# Patient Record
Sex: Female | Born: 1975 | Race: Black or African American | Hispanic: No | Marital: Single | Smoking: Former smoker
Health system: Southern US, Community
[De-identification: ages and names within clinical notes are randomized; demographics above are authoritative.]

## PROBLEM LIST (undated history)

## (undated) ENCOUNTER — Ambulatory Visit: Discharge status: Absent without leave | Payer: 59

## (undated) DIAGNOSIS — R569 Unspecified convulsions: Secondary | ICD-10-CM

## (undated) DIAGNOSIS — J439 Emphysema, unspecified: Secondary | ICD-10-CM

## (undated) DIAGNOSIS — R030 Elevated blood-pressure reading, without diagnosis of hypertension: Secondary | ICD-10-CM

## (undated) DIAGNOSIS — E079 Disorder of thyroid, unspecified: Secondary | ICD-10-CM

## (undated) DIAGNOSIS — E789 Disorder of lipoprotein metabolism, unspecified: Secondary | ICD-10-CM

## (undated) DIAGNOSIS — K8020 Calculus of gallbladder without cholecystitis without obstruction: Secondary | ICD-10-CM

## (undated) DIAGNOSIS — F32A Depression, unspecified: Secondary | ICD-10-CM

## (undated) DIAGNOSIS — E785 Hyperlipidemia, unspecified: Secondary | ICD-10-CM

## (undated) DIAGNOSIS — J449 Chronic obstructive pulmonary disease, unspecified: Secondary | ICD-10-CM

## (undated) DIAGNOSIS — F419 Anxiety disorder, unspecified: Secondary | ICD-10-CM

## (undated) DIAGNOSIS — K219 Gastro-esophageal reflux disease without esophagitis: Secondary | ICD-10-CM

## (undated) DIAGNOSIS — L732 Hidradenitis suppurativa: Secondary | ICD-10-CM

## (undated) DIAGNOSIS — I10 Essential (primary) hypertension: Secondary | ICD-10-CM

## (undated) DIAGNOSIS — E0500 Thyrotoxicosis with diffuse goiter without thyrotoxic crisis or storm: Secondary | ICD-10-CM

## (undated) HISTORY — PX: TUBAL LIGATION: SHX77

## (undated) HISTORY — DX: Emphysema, unspecified: J43.9

## (undated) HISTORY — DX: Unspecified convulsions: R56.9

## (undated) HISTORY — DX: Gastro-esophageal reflux disease without esophagitis: K21.9

## (undated) HISTORY — DX: Essential (primary) hypertension: I10

## (undated) HISTORY — PX: HERNIA REPAIR: SHX51

## (undated) HISTORY — DX: Hyperlipidemia, unspecified: E78.5

## (undated) HISTORY — PX: GALLBLADDER SURGERY: SHX652

## (undated) HISTORY — DX: Disorder of thyroid, unspecified: E07.9

---

## 2000-05-16 ENCOUNTER — Inpatient Hospital Stay (HOSPITAL_COMMUNITY): Admission: AD | Admit: 2000-05-16 | Discharge: 2000-05-21 | Payer: Self-pay | Admitting: Obstetrics and Gynecology

## 2000-11-25 ENCOUNTER — Other Ambulatory Visit: Admission: RE | Admit: 2000-11-25 | Discharge: 2000-11-25 | Payer: Self-pay | Admitting: Obstetrics and Gynecology

## 2002-07-15 ENCOUNTER — Emergency Department (HOSPITAL_COMMUNITY): Admission: EM | Admit: 2002-07-15 | Discharge: 2002-07-15 | Payer: Self-pay | Admitting: *Deleted

## 2002-07-15 ENCOUNTER — Encounter: Payer: Self-pay | Admitting: *Deleted

## 2002-12-18 ENCOUNTER — Emergency Department (HOSPITAL_COMMUNITY): Admission: EM | Admit: 2002-12-18 | Discharge: 2002-12-18 | Payer: Self-pay | Admitting: Emergency Medicine

## 2003-01-23 ENCOUNTER — Emergency Department (HOSPITAL_COMMUNITY): Admission: EM | Admit: 2003-01-23 | Discharge: 2003-01-23 | Payer: Self-pay | Admitting: Emergency Medicine

## 2003-02-01 ENCOUNTER — Emergency Department (HOSPITAL_COMMUNITY): Admission: EM | Admit: 2003-02-01 | Discharge: 2003-02-01 | Payer: Self-pay | Admitting: Emergency Medicine

## 2003-07-09 ENCOUNTER — Emergency Department (HOSPITAL_COMMUNITY): Admission: EM | Admit: 2003-07-09 | Discharge: 2003-07-09 | Payer: Self-pay | Admitting: Emergency Medicine

## 2003-09-28 ENCOUNTER — Ambulatory Visit (HOSPITAL_COMMUNITY): Admission: RE | Admit: 2003-09-28 | Discharge: 2003-09-28 | Payer: Self-pay | Admitting: *Deleted

## 2004-06-01 ENCOUNTER — Emergency Department (HOSPITAL_COMMUNITY): Admission: EM | Admit: 2004-06-01 | Discharge: 2004-06-01 | Payer: Self-pay | Admitting: *Deleted

## 2005-09-17 ENCOUNTER — Emergency Department (HOSPITAL_COMMUNITY): Admission: EM | Admit: 2005-09-17 | Discharge: 2005-09-17 | Payer: Self-pay | Admitting: Emergency Medicine

## 2005-10-05 ENCOUNTER — Emergency Department (HOSPITAL_COMMUNITY): Admission: EM | Admit: 2005-10-05 | Discharge: 2005-10-06 | Payer: Self-pay | Admitting: Emergency Medicine

## 2008-09-19 ENCOUNTER — Emergency Department (HOSPITAL_COMMUNITY): Admission: EM | Admit: 2008-09-19 | Discharge: 2008-09-20 | Payer: Self-pay | Admitting: Emergency Medicine

## 2008-09-19 IMAGING — CR DG CHEST 2V
2 series · 2 of 2 positions shown · non-contrast
Comparison: None

CLINICAL DATA: Shortness of breath and cough; difficulty
swallowing.  History of smoking.

CHEST - 2 VIEW

[view not recorded (1 of 2)]
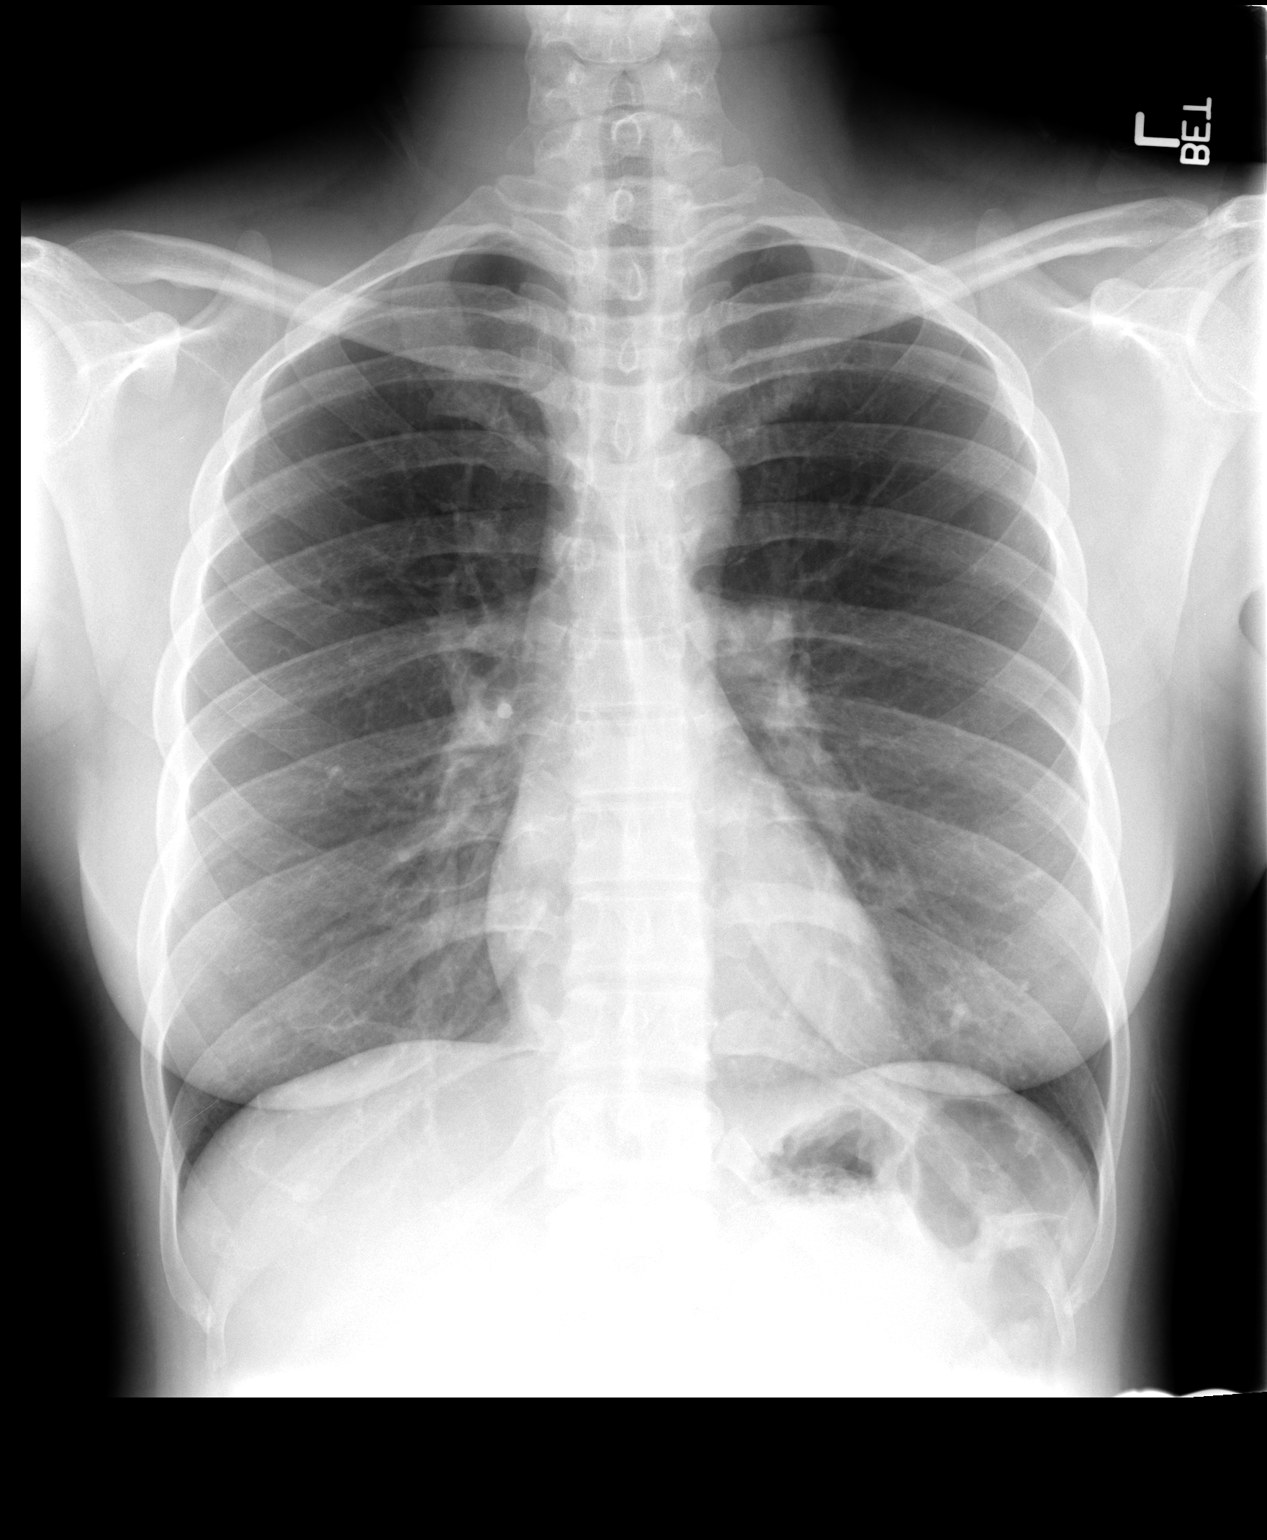

[view not recorded (2 of 2)]
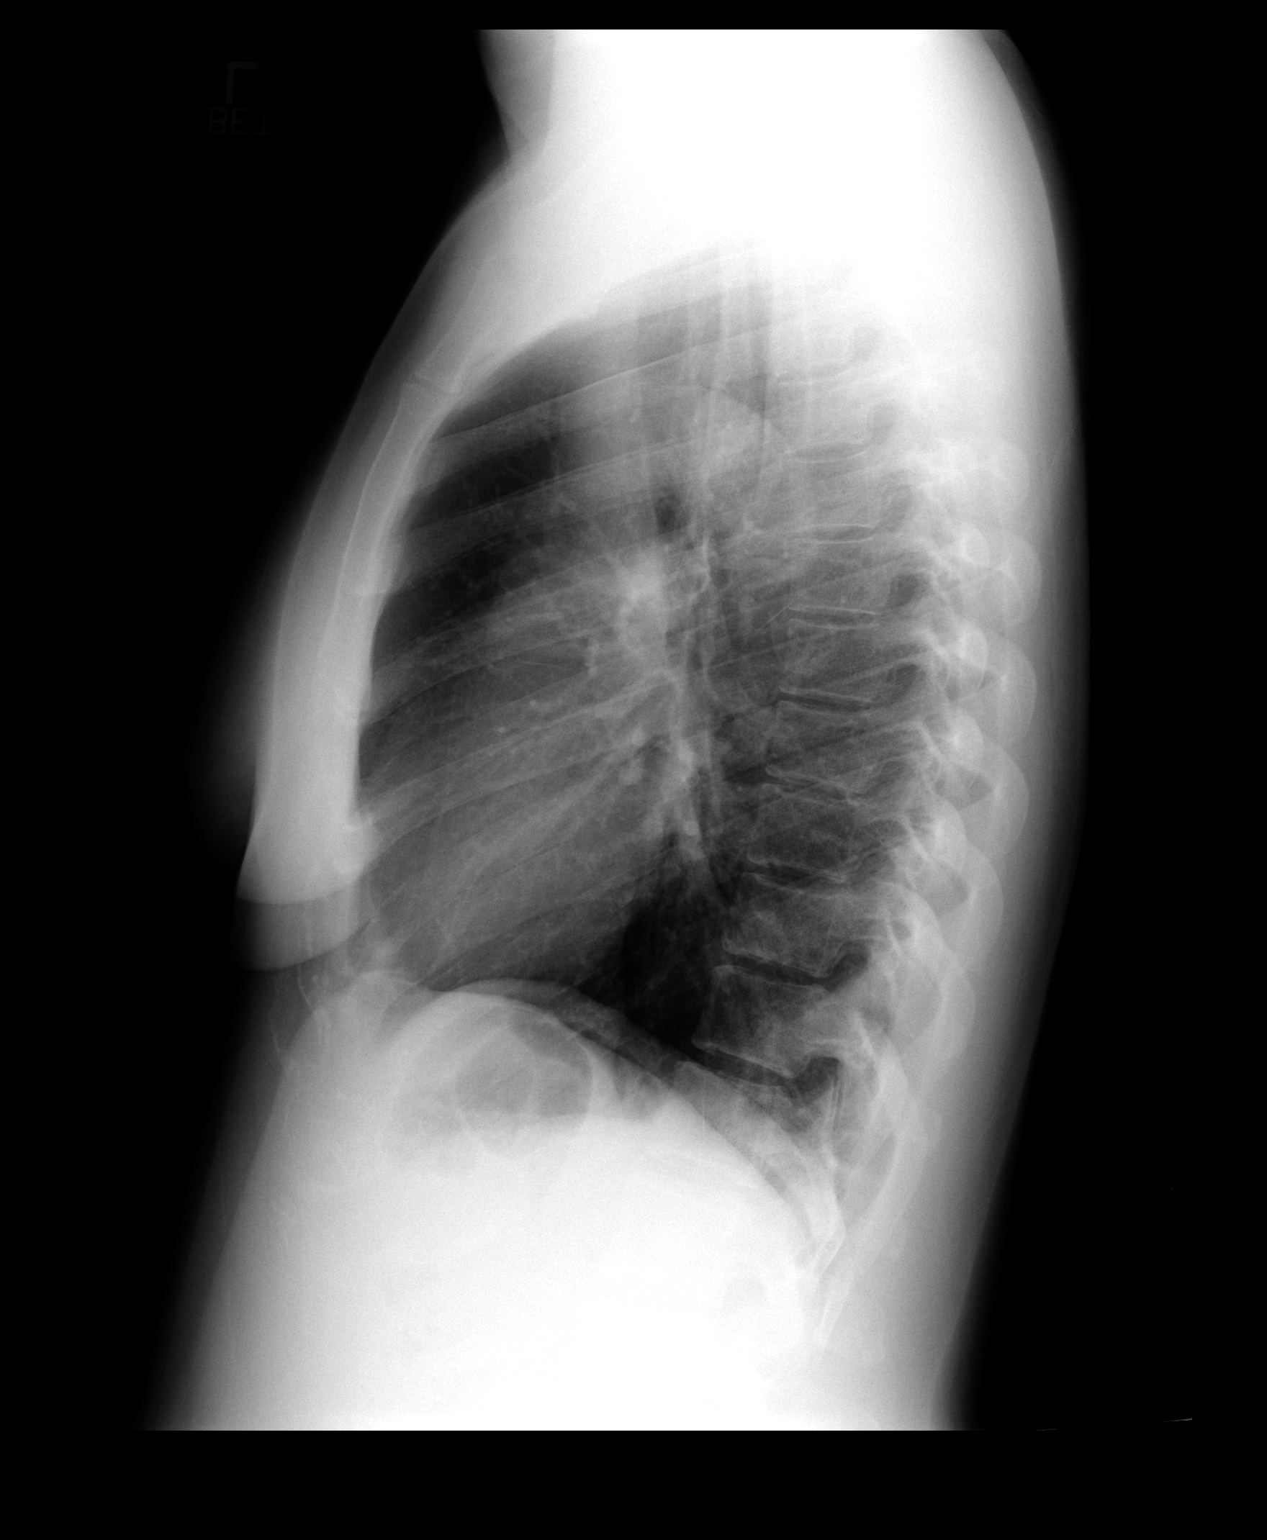

[2 of 2 positions shown; findings below may reference images not displayed]

FINDINGS: The lungs are well-aerated and clear.  There is no
evidence of focal opacification, pleural effusion or pneumothorax.

The heart is normal in size; the mediastinal contour is within
normal limits.  No acute osseous abnormalities are seen.
IMPRESSION: No acute cardiopulmonary process seen.

## 2010-06-13 NOTE — H&P (Signed)
NAME:  Kimberly Rocha, Kimberly Rocha                          ACCOUNT NO.:  [id number]   MEDICAL RECORD NO.:  [id number]                   PATIENT TYPE:  AMB   LOCATION:  DAY                                  FACILITY:  [facility]   PHYSICIAN:  Langley Gauss, M.D.                DATE OF BIRTH:  April 19, 1975   DATE OF ADMISSION:  09/28/2003  DATE OF DISCHARGE:  09/28/2003                                HISTORY & PHYSICAL   HISTORY OF PRESENT ILLNESS:  The patient is a 35 year old multiparous  patient currently utilizing Depo-Provera for birth-control purposes, who  desires permanent sterilization.  She understands and accepts the inherent  2% failure rate associated with the procedure, and understands that this is  to be considered a permanent and irreversible procedure.  She is also aware  that there are other reversible forms of birth control available.  At this  point in time, she is absolutely certain of her desire for no future  childbearing potential.  Thus, she is admitted for laparoscopic tubal  ligation utilizing bipolar cautery.   The risks and benefits of surgery were discussed with the patient prior to  performing it, to include risks of hemorrhage, infection, or complications  associated with the anesthesia.   ALLERGIES:  The patient states she is allergic to Demerol which causes her  to itch.   MEDICATIONS:  The most recent medication, Depo-Provera injected August 07, 2003.   PAST SURGICAL HISTORY:  1.  The patient had hernia repair in 1986 and again in 1987.  2.  The patient is noted to have four prior vaginal deliveries.  3.  On April 30, 2003, the patient presented to OB unassigned.  Twin      gestation of 33-5/[redacted] weeks gestation, at which time she underwent low-      transverse cesarean section by Dr. Langley Gauss.   PHYSICAL EXAMINATION:  GENERAL:  A pleasant, black female.  VITAL SIGNS:  Height 5 feet, 2 inches, weight 155, blood pressure 113/80,  pulse rate 80, respiratory rate  20.  HEENT:  Negative, no adenopathy.  NECK:  Supple.  Thyroid is nonpalpable.  LUNGS:  Clear.  CARDIOVASCULAR:  Exam is regular rate and rhythm.  ABDOMEN:  Soft, nontender.  She has previous Pfannenstiel incision noted.  No other surgical scars identified.  No pelvic masses.  EXTREMITIES:  Normal.  PELVIC EXAM:  Normal external genitalia, no lesions or ulcerations  identified.  Bladder as well as urethra noted to be well supported.  The  cervix is normal in appearance.  Bimanual examination, retroflexed normal-  sized uterus, no adnexal masses.   ASSESSMENT:  Multiparity, desires permanent sterilization.  The appropriate  paperwork had previously been completed at the office.  Thus, plan on  proceeding with the planned operative procedure on September 28, 2003.     ___________________________________________  Langley Gauss, M.D.   DC/MEDQ  D:  10/01/2003  T:  10/01/2003  Job:  [id number]

## 2010-06-13 NOTE — Op Note (Signed)
NAME:  Kimberly Rocha, Kimberly Rocha                          ACCOUNT NO.:  [id number]   MEDICAL RECORD NO.:  [id number]                   PATIENT TYPE:  AMB   LOCATION:  DAY                                  FACILITY:  [facility]   PHYSICIAN:  Langley Gauss, M.D.                DATE OF BIRTH:  Mar 07, 1975   DATE OF PROCEDURE:  09/28/2003  DATE OF DISCHARGE:  09/28/2003                                 OPERATIVE REPORT   PREOPERATIVE DIAGNOSIS:  Multiparity, desirous permanent sterilization.   POSTOPERATIVE DIAGNOSIS:  Multiparity, desirous permanent sterilization.   PROCEDURE PERFORMED:  Laparoscopic tubal ligation utilizing bipolar cautery.   SURGEON:  Langley Gauss, M.D.   ANESTHESIA:  General endotracheal.   SPECIMENS:  None.   COMPLICATIONS:  None.   FINDINGS:  At time of surgery include diffusely enlarged multiparous uterus  well supported, uterine fundus specifically noted to be somewhat globular,  minimal adhesive disease within the right adnexa, ovaries normal in  appearance.   SUMMARY:  Vital signs stable. Patient taken to the operating room. Underwent  induction of general anesthesia. Then placed in the dorsal lithotomy  position. Prepped and draped in usual sterile manner. Red Rover catheter  used to drain 250 mg cc of clear yellow urine from the bladder. Speculum was  placed. Anterior lip of the cervix was grasped with a single toothed  tenaculum. A Hulka intrauterine manipulator was then passed through the  endocervix, used to grasp cervix to 12 o'clock and then changed to sterile  gloves. Standing at the patient's left side, 1-cm vertical incision made  just inferior to the umbilicus. Abdominal wall was elevated. The Verres  needle was then passed through the subumbilical incision, angling  perpendicular to the fascial plane. This was done atraumatically. Drop test  then confirms a proper intraperitoneal placement with no apparent bowel or  vascular injury. Pneumoperitoneum was  then created by insufflating 3 liters  of CO2 gas with a filling pressure of 10 to 15 mmHg. Adequate  pneumoperitoneum achieved. Verres needle was removed. Abdominal wall was  again elevated. The disposal Visiport trocar and camera device were then  inserted subumbilically, angulating toward the hollow of the sacrum under  direct visualization. This was done atraumatically. Pelvic contents as  described previously. Uterus was then elevated. A 5-mm trocar inserted  suprapubically in the midline and also under direct visualization, pelvic  contents now well visualized. Each of the fallopian tubes were identified  and confirmed by tracing them to their fimbriated ends. Bipolar  cauterization was then performed on each tube with the most proximal being 1  cm from the tubouterine junction. A total of three cauterizations are  performed on each fallopian tube, each being in direct continuity with the  previous, resulting in extensive tubular destruction. Bipolar cautery is  continued until ____________ down at about 2 or 3. Appropriate photographs  were taken bilaterally to document this tubular destruction  bilaterally. In  addition, anterior and posterior cul-de-sac noted to be free of any  significant pathology and likewise photographed. Following the procedure and  determination of no intraperitoneal complications, instruments are removed  as pneumoperitoneum is allowed to escape. Our 2 incision sites are then  closed utilizing a 2-0 Vicryl suture through the fascial layer. This  likewise resulted in spontaneous reapproximation of the skin edges. The  patient tolerated the procedure very well. Speculum as well as Hulka  tenaculum taken off the cervix. The patient had reverse of anesthesia, taken  to recovery room in stable condition.   DISCHARGE:  The patient is discharged to home on same date of cervix, given  a copy of standard discharge instructions, follow up in the office in 1 to 2   weeks' time.   DISCHARGE MEDICATIONS:  She has previously been given a prescription for  Lortab 10/500 which she can now use in the postoperative state.   PERTINENT LABORATORY DATA:  She is noted to be HCG negative. Hemoglobin  13.1, hematocrit 37.4 with a white count of 9.0.   DISPOSITION:  Having no postoperative complications and satisfying all  criteria, the patient was discharged to home on September 28, 2003.      ___________________________________________                                            Langley Gauss, M.D.   DC/MEDQ  D:  10/01/2003  T:  10/01/2003  Job:  [id number]

## 2010-10-13 ENCOUNTER — Emergency Department (HOSPITAL_COMMUNITY): Payer: Self-pay

## 2010-10-13 ENCOUNTER — Encounter: Payer: Self-pay | Admitting: *Deleted

## 2010-10-13 ENCOUNTER — Emergency Department (HOSPITAL_COMMUNITY)
Admission: EM | Admit: 2010-10-13 | Discharge: 2010-10-13 | Disposition: A | Discharge status: Home / Self Care | Payer: Self-pay | Attending: Emergency Medicine | Admitting: Emergency Medicine

## 2010-10-13 DIAGNOSIS — R0789 Other chest pain: Secondary | ICD-10-CM | POA: Insufficient documentation

## 2010-10-13 DIAGNOSIS — M25519 Pain in unspecified shoulder: Secondary | ICD-10-CM | POA: Insufficient documentation

## 2010-10-13 IMAGING — CR DG CHEST 2V
2 series · 2 of 2 positions shown · non-contrast
Comparison: [DATE]

CLINICAL DATA: Chest pain.

CHEST - 2 VIEW

[view not recorded (1 of 2)]
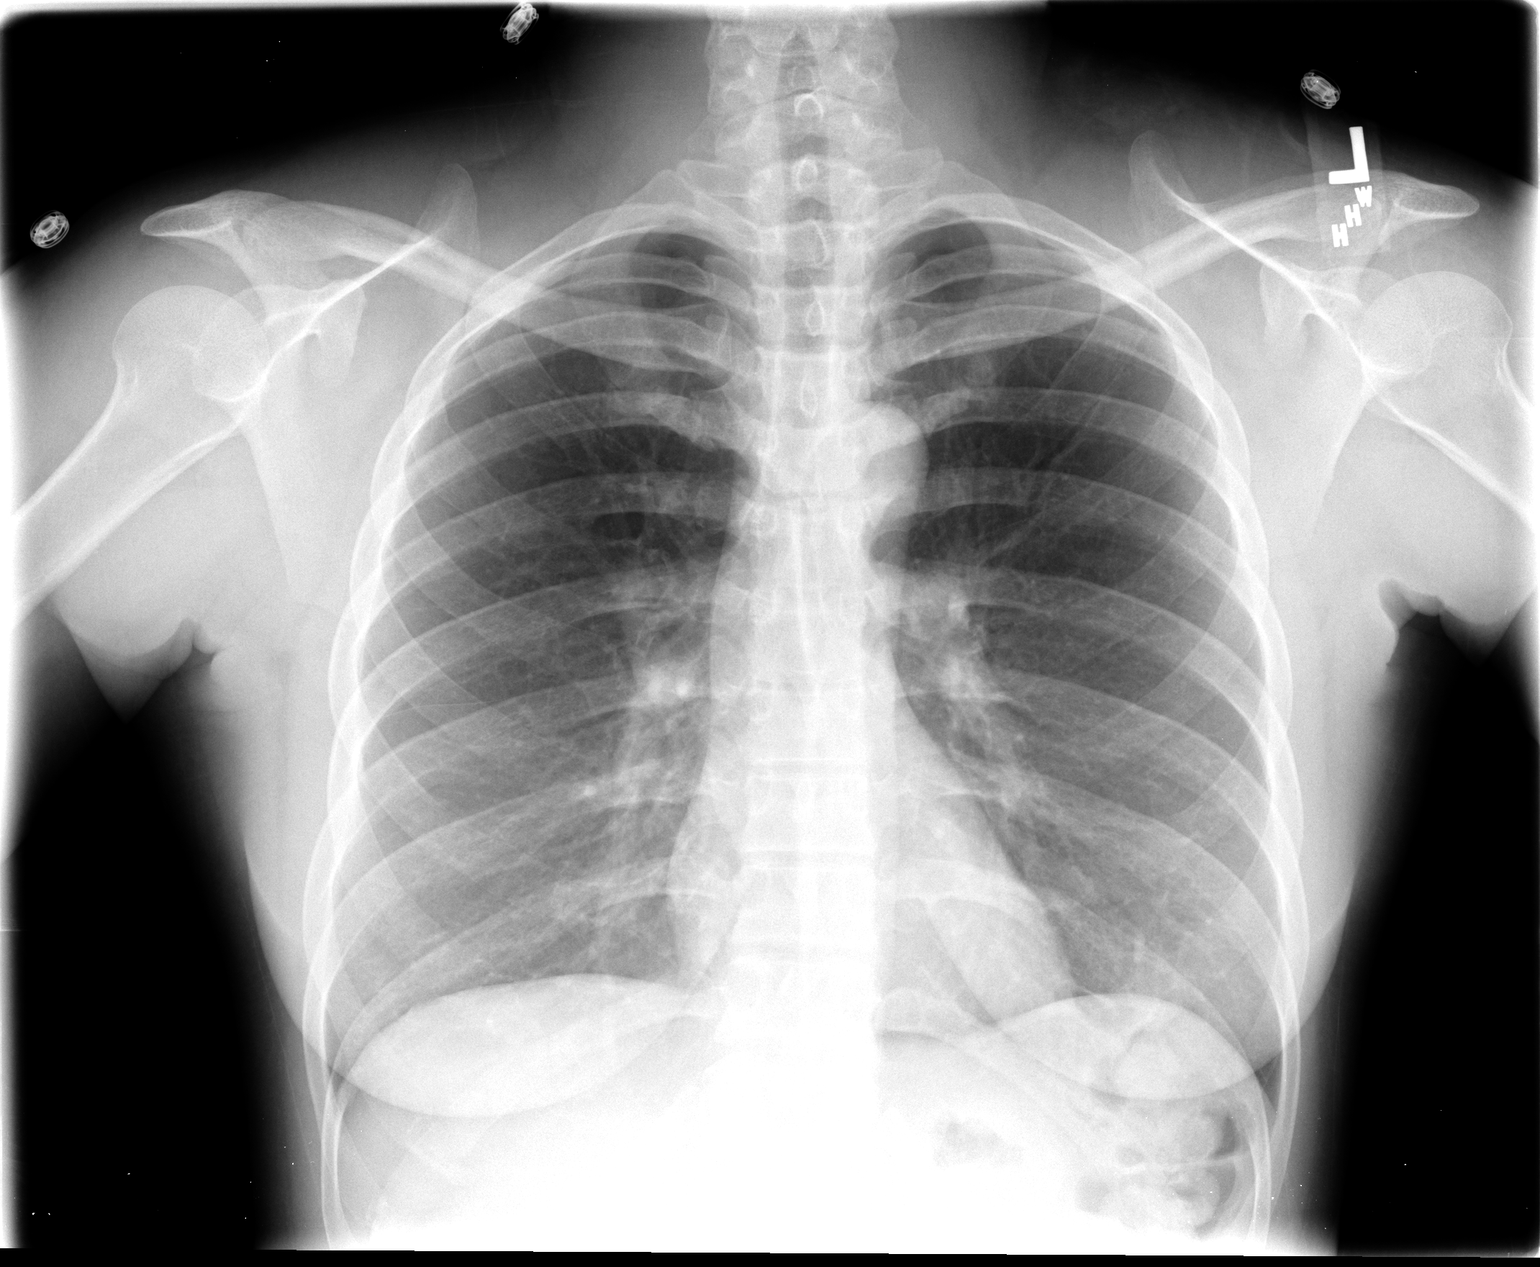

[view not recorded (2 of 2)]
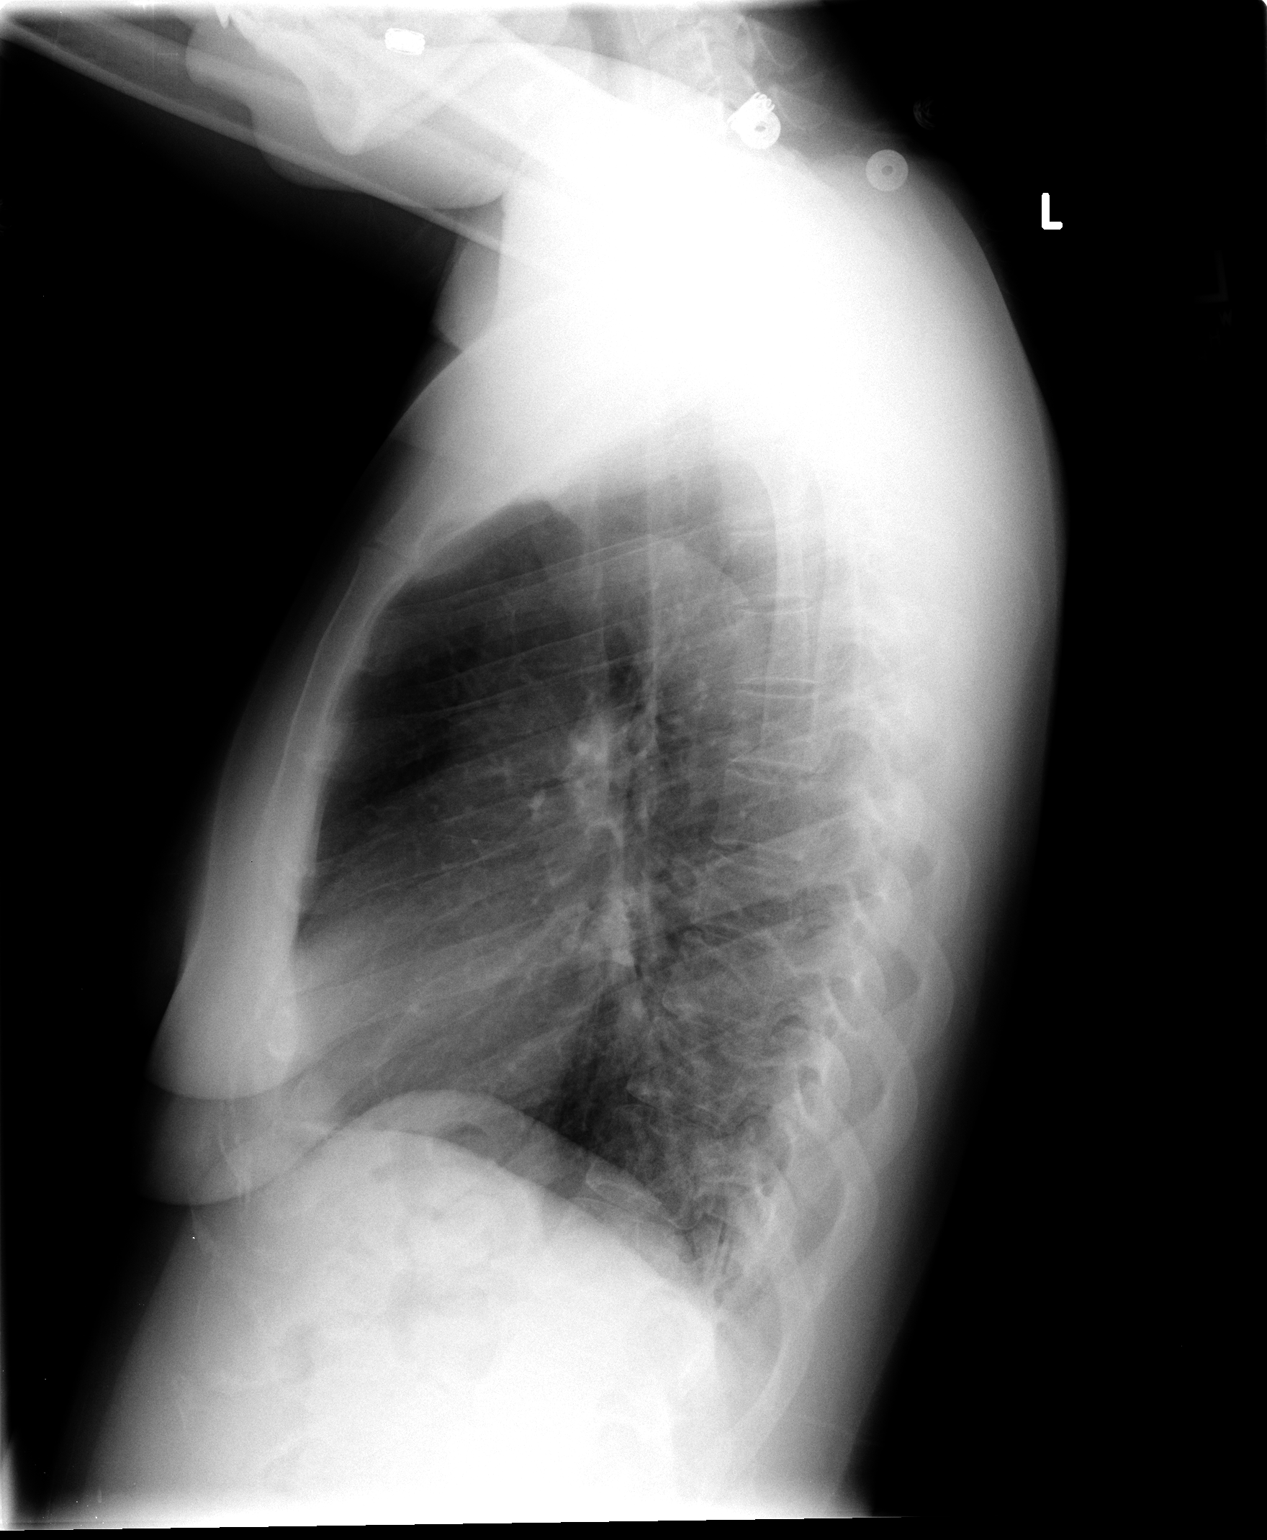

[2 of 2 positions shown; findings below may reference images not displayed]

FINDINGS: Lungs are clear. No pleural effusion or pneumothorax. The
cardiomediastinal contours are within normal limits. The visualized
bones and soft tissues are without significant appreciable
abnormality.
IMPRESSION: No acute cardiopulmonary process.

## 2010-10-13 MED ORDER — IBUPROFEN 800 MG PO TABS
800.0000 mg | ORAL_TABLET | Freq: Once | ORAL | Status: AC
  Administered 2010-10-13: 800 mg via ORAL
  Filled 2010-10-13: qty 1

## 2010-10-13 MED ORDER — IBUPROFEN 800 MG PO TABS: 800.0000 mg | ORAL_TABLET | Freq: Three times a day (TID) | ORAL | Status: AC

## 2010-10-13 MED ORDER — HYDROCODONE-ACETAMINOPHEN 5-325 MG PO TABS
1.0000 | ORAL_TABLET | Freq: Once | ORAL | Status: AC
  Administered 2010-10-13: 1 via ORAL
  Filled 2010-10-13: qty 1

## 2010-10-13 NOTE — ED Notes (Signed)
C/o left shoulder pain and pain to lower left chest under breast, + cough

## 2010-10-13 NOTE — ED Provider Notes (Signed)
History     CSN: [id number] Arrival date & time: 10/13/2010  1:24 AM   Chief Complaint  Patient presents with  . Shoulder Pain     (Include location/radiation/quality/duration/timing/severity/associated sxs/prior treatment) HPI Comments: Seen 0133  Patient is a 35 y.o. female presenting with shoulder pain. The history is provided by the patient.  Shoulder Pain This is a new problem. The current episode started yesterday. Associated symptoms include chest pain. Associated symptoms comments: Left shoulder and breast pain with radiation to chest. Exacerbated by: sharp pain with breathing or movement. The symptoms are relieved by nothing. She has tried nothing for the symptoms.     History reviewed. No pertinent past medical history.   Past Surgical History  Procedure Date  . Hernia repair   . Cesarean section     History reviewed. No pertinent family history.  History  Substance Use Topics  . Smoking status: Current Everyday Smoker -- 0.5 packs/day    Types: Cigarettes  . Smokeless tobacco: Not on file  . Alcohol Use: Yes     occ. use    OB History    Grav Para Term Preterm Abortions TAB SAB Ect Mult Living                  Review of Systems  Cardiovascular: Positive for chest pain.  All other systems reviewed and are negative.    Allergies  Demerol  Home Medications  No current outpatient prescriptions on file.  Physical Exam    BP 111/75  Pulse 88  Temp(Src) 98.5 F (36.9 C) (Oral)  Resp 18  Ht 5\' 2"  (1.575 m)  Wt 135 lb (61.236 kg)  BMI 24.69 kg/m2  SpO2 100%  LMP 09/25/2010  Physical Exam  Nursing note and vitals reviewed. Constitutional: She is oriented to person, place, and time. She appears well-developed and well-nourished.  HENT:  Head: Normocephalic and atraumatic.  Eyes: EOM are normal.  Neck: Normal range of motion.  Cardiovascular: Normal rate, normal heart sounds and intact distal pulses.   Pulmonary/Chest: Effort normal and  breath sounds normal. She exhibits no tenderness.  Abdominal: Soft.  Musculoskeletal: Normal range of motion.  Neurological: She is alert and oriented to person, place, and time.  Skin: Skin is warm and dry.    ED Course  Procedures  No results found for this or any previous visit. Dg Chest 2 View  10/13/2010  *RADIOLOGY REPORT*  Clinical Data:  Chest pain.  CHEST - 2 VIEW  Comparison:  09/19/2008  Findings: Lungs are clear. No pleural effusion or pneumothorax. The cardiomediastinal contours are within normal limits. The visualized bones and soft tissues are without significant appreciable abnormality.  IMPRESSION: No acute cardiopulmonary process.  Original Report Authenticated By: Waneta Martins, M.D.   Patient with chest pain and left shoulder pain, worse with deep breathing and with movement. Unable to reproduce pain with palpation. Chest xray unremarkable. Pain is c/w chest wall pain. Patient informed of clinical course, understand medical decision-making process, and agree with plan. MDM Reviewed: nursing note and vitals Interpretation: x-ray          Nicoletta Dress. Colon Branch, MD 10/13/10 [id number]

## 2012-07-23 ENCOUNTER — Emergency Department (HOSPITAL_COMMUNITY): Payer: Medicaid Other

## 2012-07-23 ENCOUNTER — Encounter (HOSPITAL_COMMUNITY): Payer: Self-pay | Admitting: Emergency Medicine

## 2012-07-23 ENCOUNTER — Emergency Department (HOSPITAL_COMMUNITY)
Admission: EM | Admit: 2012-07-23 | Discharge: 2012-07-23 | Disposition: A | Discharge status: Home / Self Care | Payer: Medicaid Other | Attending: Emergency Medicine | Admitting: Emergency Medicine

## 2012-07-23 DIAGNOSIS — M545 Low back pain, unspecified: Secondary | ICD-10-CM | POA: Insufficient documentation

## 2012-07-23 DIAGNOSIS — K8020 Calculus of gallbladder without cholecystitis without obstruction: Secondary | ICD-10-CM | POA: Insufficient documentation

## 2012-07-23 DIAGNOSIS — R51 Headache: Secondary | ICD-10-CM | POA: Insufficient documentation

## 2012-07-23 DIAGNOSIS — Z9889 Other specified postprocedural states: Secondary | ICD-10-CM | POA: Insufficient documentation

## 2012-07-23 DIAGNOSIS — R0602 Shortness of breath: Secondary | ICD-10-CM | POA: Insufficient documentation

## 2012-07-23 DIAGNOSIS — R1013 Epigastric pain: Secondary | ICD-10-CM | POA: Insufficient documentation

## 2012-07-23 DIAGNOSIS — N390 Urinary tract infection, site not specified: Secondary | ICD-10-CM

## 2012-07-23 DIAGNOSIS — R509 Fever, unspecified: Secondary | ICD-10-CM | POA: Insufficient documentation

## 2012-07-23 DIAGNOSIS — F17200 Nicotine dependence, unspecified, uncomplicated: Secondary | ICD-10-CM | POA: Insufficient documentation

## 2012-07-23 LAB — COMPREHENSIVE METABOLIC PANEL
ALT: 10 U/L (ref 0–35)
AST: 15 U/L (ref 0–37)
Albumin: 3.9 g/dL (ref 3.5–5.2)
Alkaline Phosphatase: 79 U/L (ref 39–117)
BUN: 10 mg/dL (ref 6–23)
CO2: 28 mEq/L (ref 19–32)
Calcium: 9.5 mg/dL (ref 8.4–10.5)
Chloride: 103 mEq/L (ref 96–112)
Creatinine, Ser: 0.98 mg/dL (ref 0.50–1.10)
GFR calc Af Amer: 84 mL/min — ABNORMAL LOW (ref >90)
GFR calc non Af Amer: 73 mL/min — ABNORMAL LOW (ref >90)
Glucose, Bld: 94 mg/dL (ref 70–99)
Potassium: 3.2 mEq/L — ABNORMAL LOW (ref 3.5–5.1)
Sodium: 138 mEq/L (ref 135–145)
Total Bilirubin: 0.2 mg/dL — ABNORMAL LOW (ref 0.3–1.2)
Total Protein: 7.4 g/dL (ref 6.0–8.3)

## 2012-07-23 LAB — LIPASE, BLOOD: Lipase: 47 U/L (ref 11–59)

## 2012-07-23 LAB — CBC WITH DIFFERENTIAL/PLATELET
Basophils Absolute: 0 10*3/uL (ref 0.0–0.1)
Basophils Relative: 0 % (ref 0–1)
Eosinophils Absolute: 0.1 10*3/uL (ref 0.0–0.7)
Eosinophils Relative: 1 % (ref 0–5)
HCT: 38.7 % (ref 36.0–46.0)
Hemoglobin: 13.4 g/dL (ref 12.0–15.0)
Lymphocytes Relative: 31 % (ref 12–46)
Lymphs Abs: 4.9 10*3/uL — ABNORMAL HIGH (ref 0.7–4.0)
MCH: 33.8 pg (ref 26.0–34.0)
MCHC: 34.6 g/dL (ref 30.0–36.0)
MCV: 97.5 fL (ref 78.0–100.0)
Monocytes Absolute: 0.9 10*3/uL (ref 0.1–1.0)
Monocytes Relative: 6 % (ref 3–12)
Neutro Abs: 9.7 10*3/uL — ABNORMAL HIGH (ref 1.7–7.7)
Neutrophils Relative %: 62 % (ref 43–77)
Platelets: 203 10*3/uL (ref 150–400)
RBC: 3.97 MIL/uL (ref 3.87–5.11)
RDW: 13.1 % (ref 11.5–15.5)
WBC: 15.7 10*3/uL — ABNORMAL HIGH (ref 4.0–10.5)

## 2012-07-23 LAB — URINALYSIS, ROUTINE W REFLEX MICROSCOPIC
Bilirubin Urine: NEGATIVE
Glucose, UA: NEGATIVE mg/dL
Ketones, ur: NEGATIVE mg/dL
Nitrite: NEGATIVE
Protein, ur: NEGATIVE mg/dL
Specific Gravity, Urine: 1.025 (ref 1.005–1.030)
Urobilinogen, UA: 0.2 mg/dL (ref 0.0–1.0)
pH: 6.5 (ref 5.0–8.0)

## 2012-07-23 LAB — URINE MICROSCOPIC-ADD ON

## 2012-07-23 IMAGING — US US ABDOMEN COMPLETE
1 series · 14 of 25 positions shown · non-contrast
Comparison: None.

CLINICAL DATA: Chest pain

ABDOMINAL ULTRASOUND COMPLETE

[Series 1: us abdomen complete · 0.26mm/px · 14 of 121 slices shown]
[im 1/121]
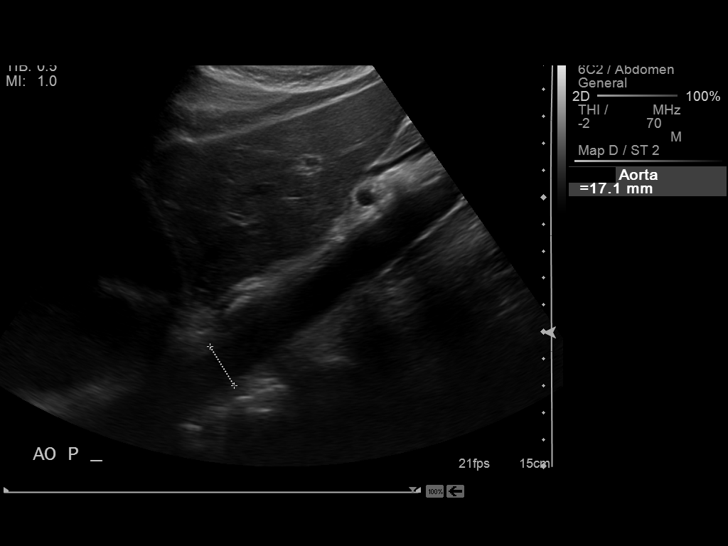
[im 11/121]
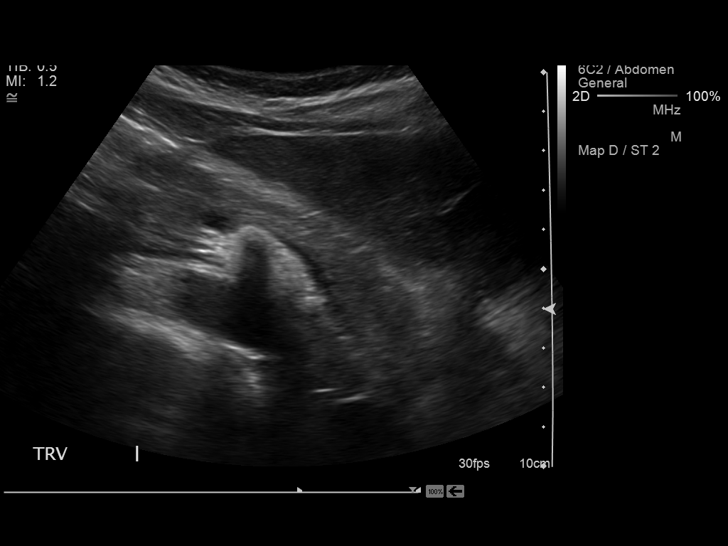
[im 21/121]
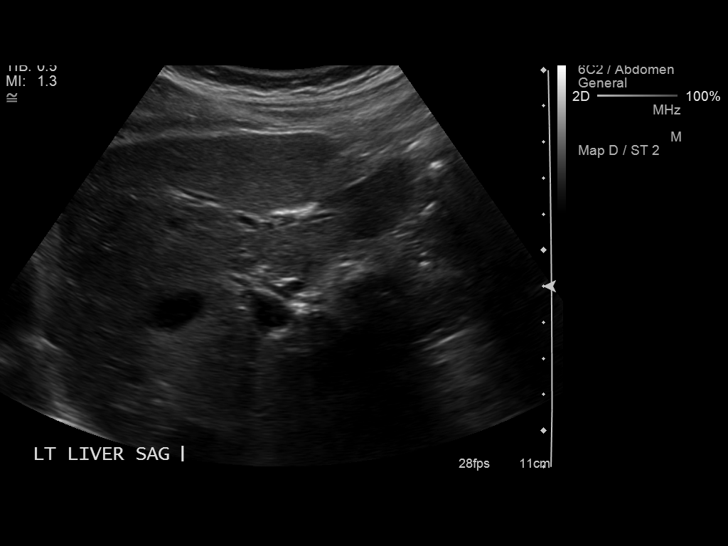
[im 31/121]
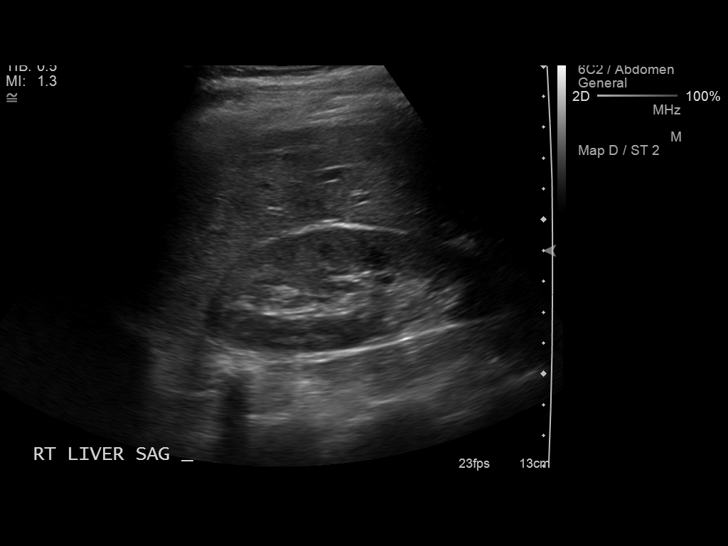
[im 41/121]
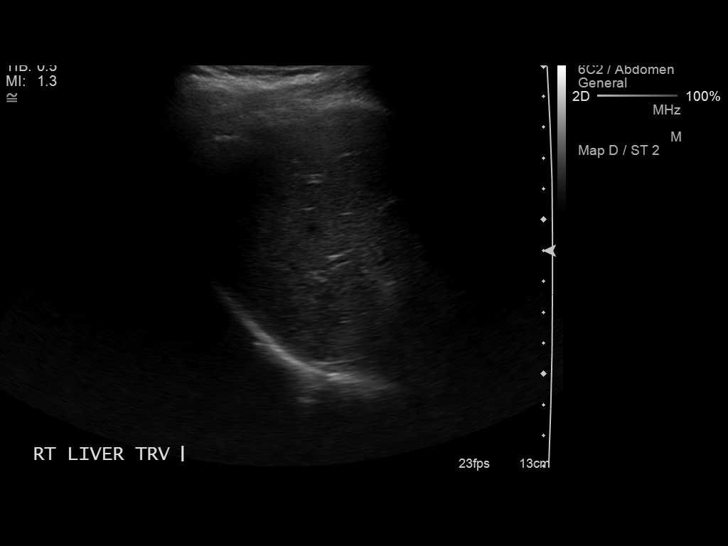
[im 46/121]
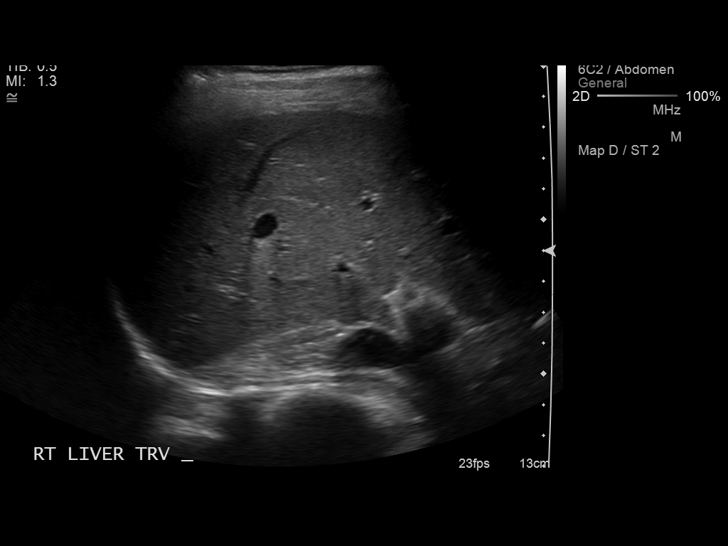
[im 56/121]
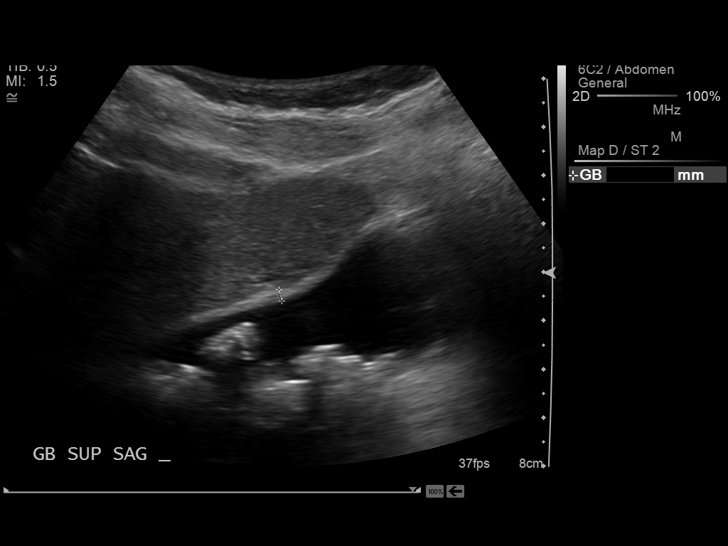
[im 66/121]
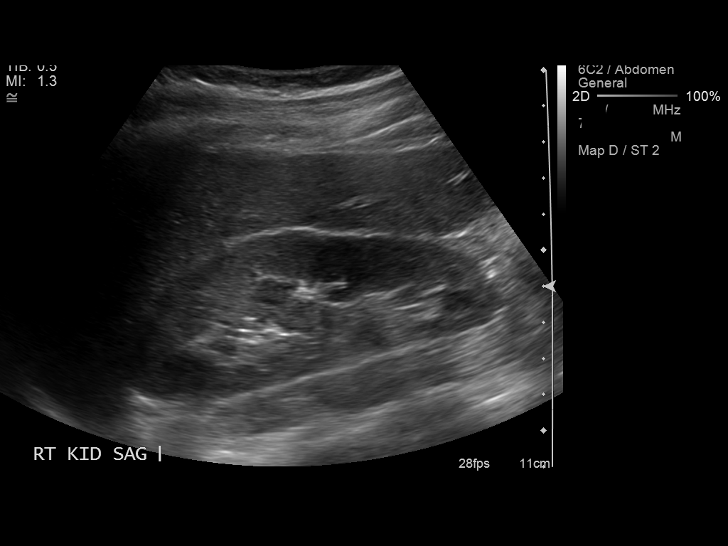
[im 76/121]
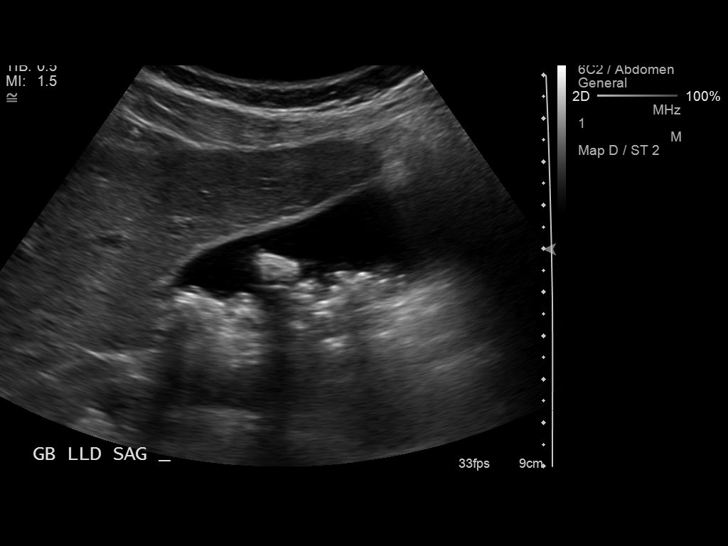
[im 81/121]
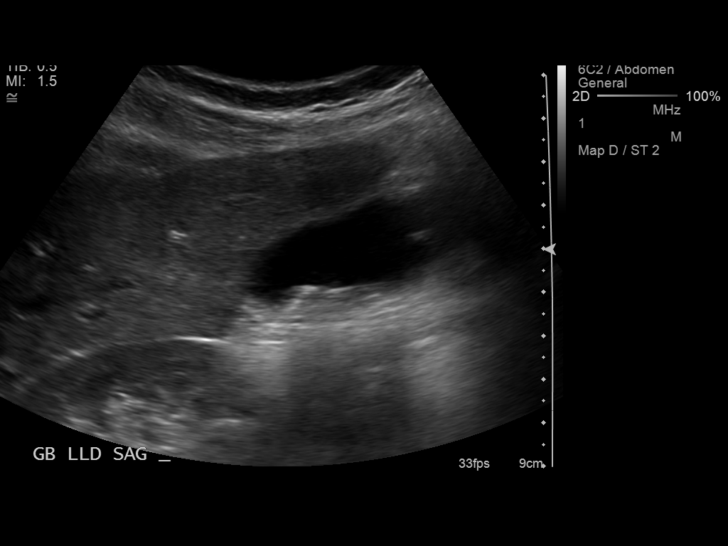
[im 91/121]
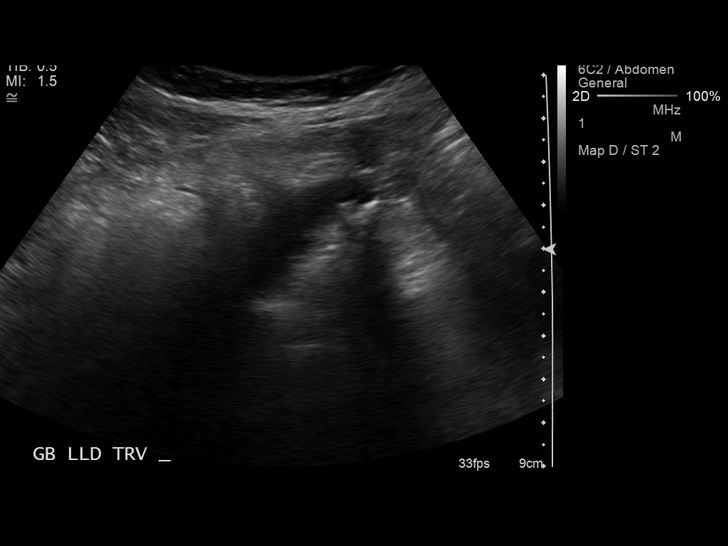
[im 101/121]
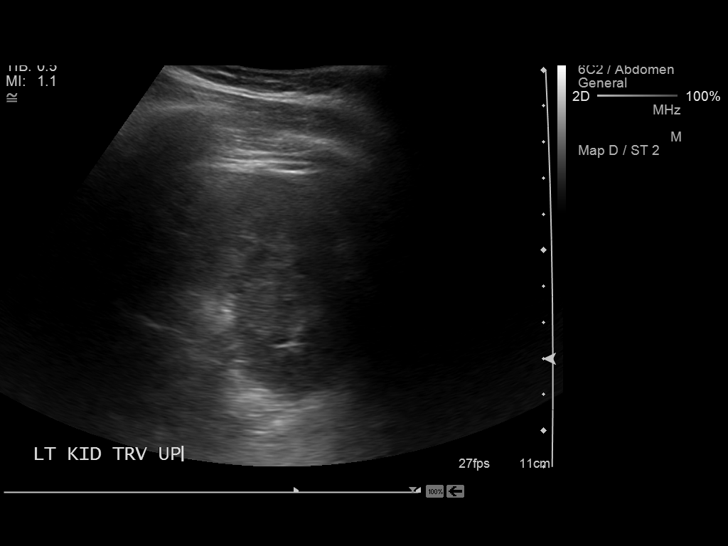
[im 111/121]
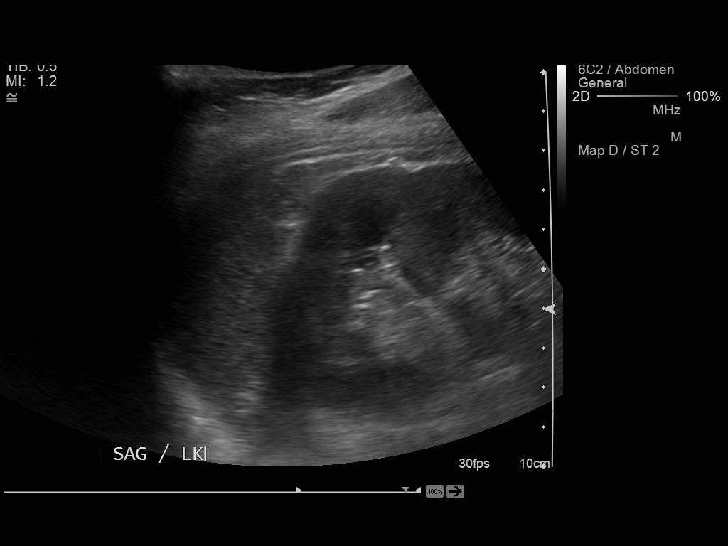
[im 121/121]
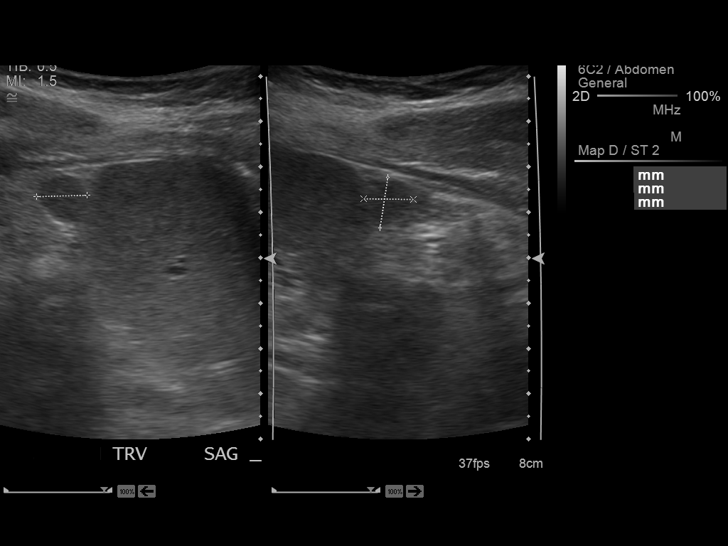

[14 of 25 positions shown; findings below may reference images not displayed]

FINDINGS: Gallbladder:  Multiple gallstones are present.  The largest is
cm.  No wall thickening or pericholecystic fluid.  No Murphy's
sign.

Common Bile Duct:  Within normal limits in caliber.

Liver: No focal mass lesion identified.  Within normal limits in
parenchymal echogenicity.

IVC:  Appears normal.

Pancreas:  No abnormality identified.

Spleen:  Within normal limits in size and echotexture. Small 1.1 cm
splenule is noted.

Right kidney:  Normal in size and parenchymal echogenicity.  No
evidence of mass or hydronephrosis.

Left kidney:  Normal in size and parenchymal echogenicity.  No
evidence of mass or hydronephrosis.

Abdominal Aorta:  No aneurysm identified.
IMPRESSION: Cholelithiasis.

## 2012-07-23 IMAGING — CR DG CHEST 2V
2 series · 2 of 2 positions shown · non-contrast
Comparison: Two-view chest [DATE]

CLINICAL DATA: Chest pain.

CHEST - 2 VIEW

[view not recorded (1 of 2)]
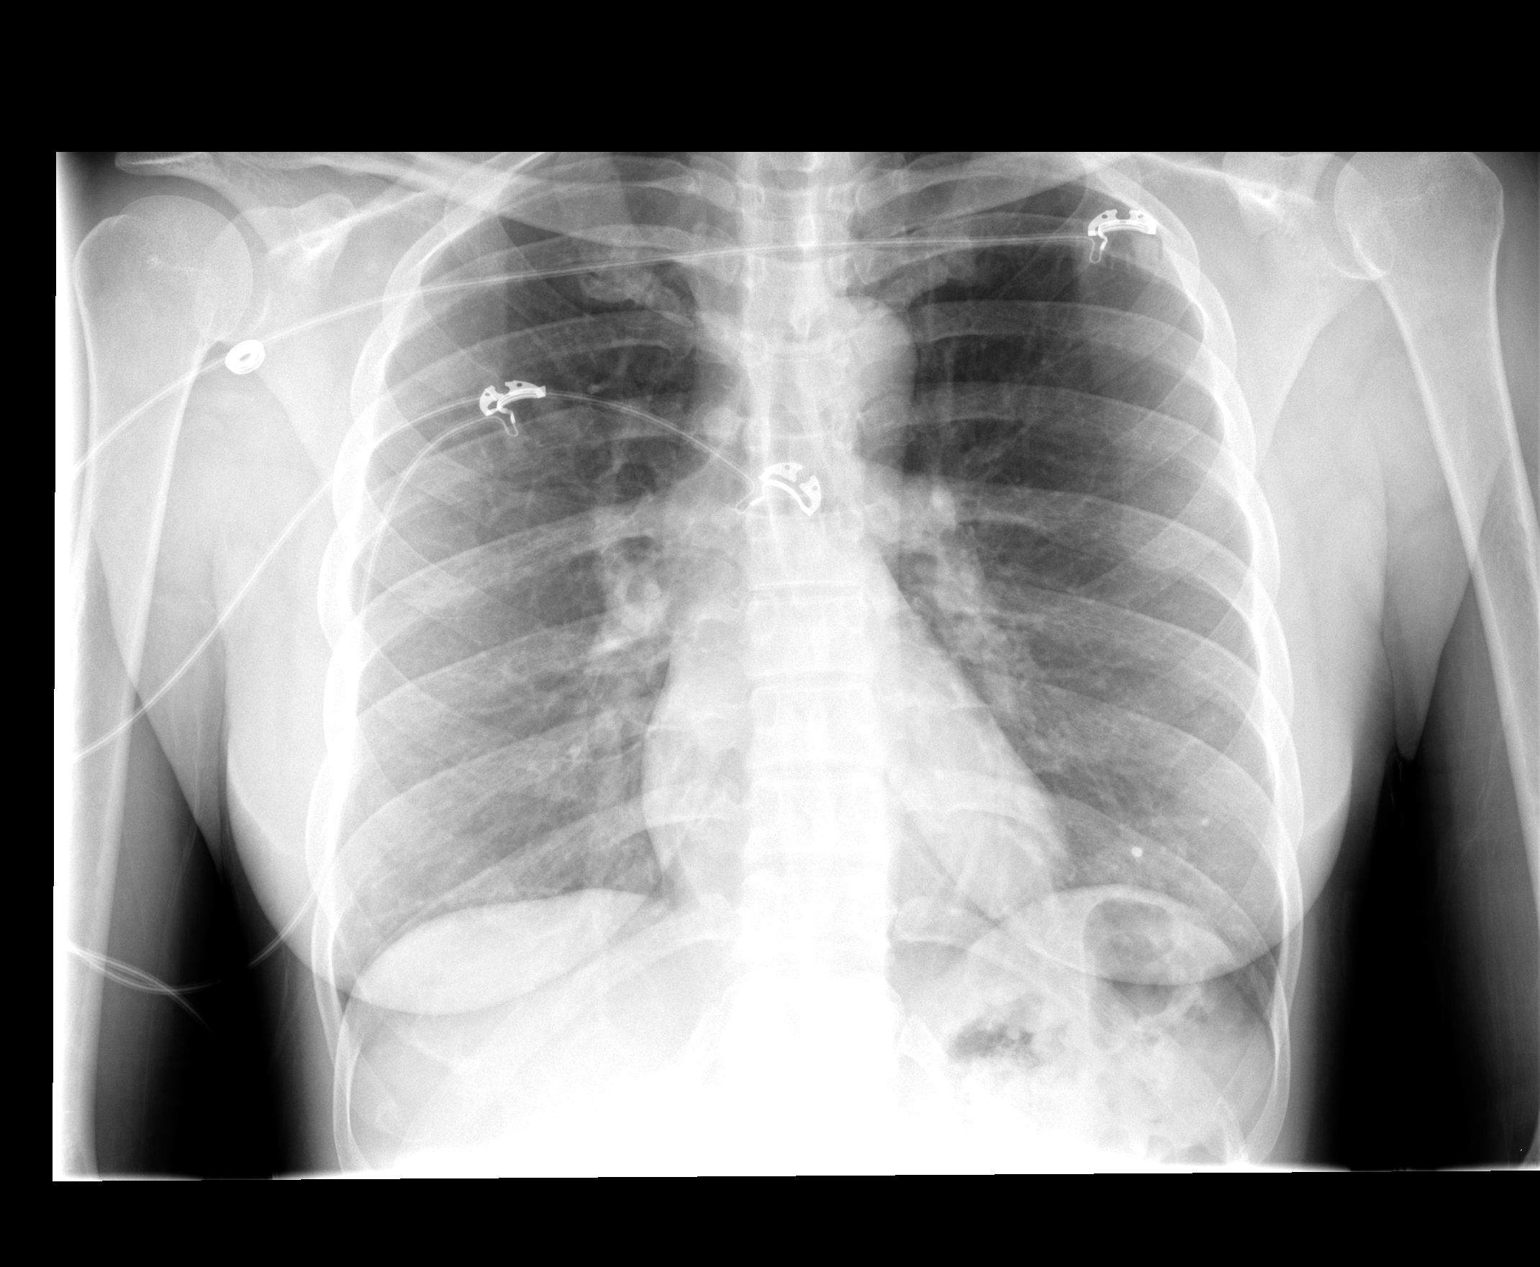

[view not recorded (2 of 2)]
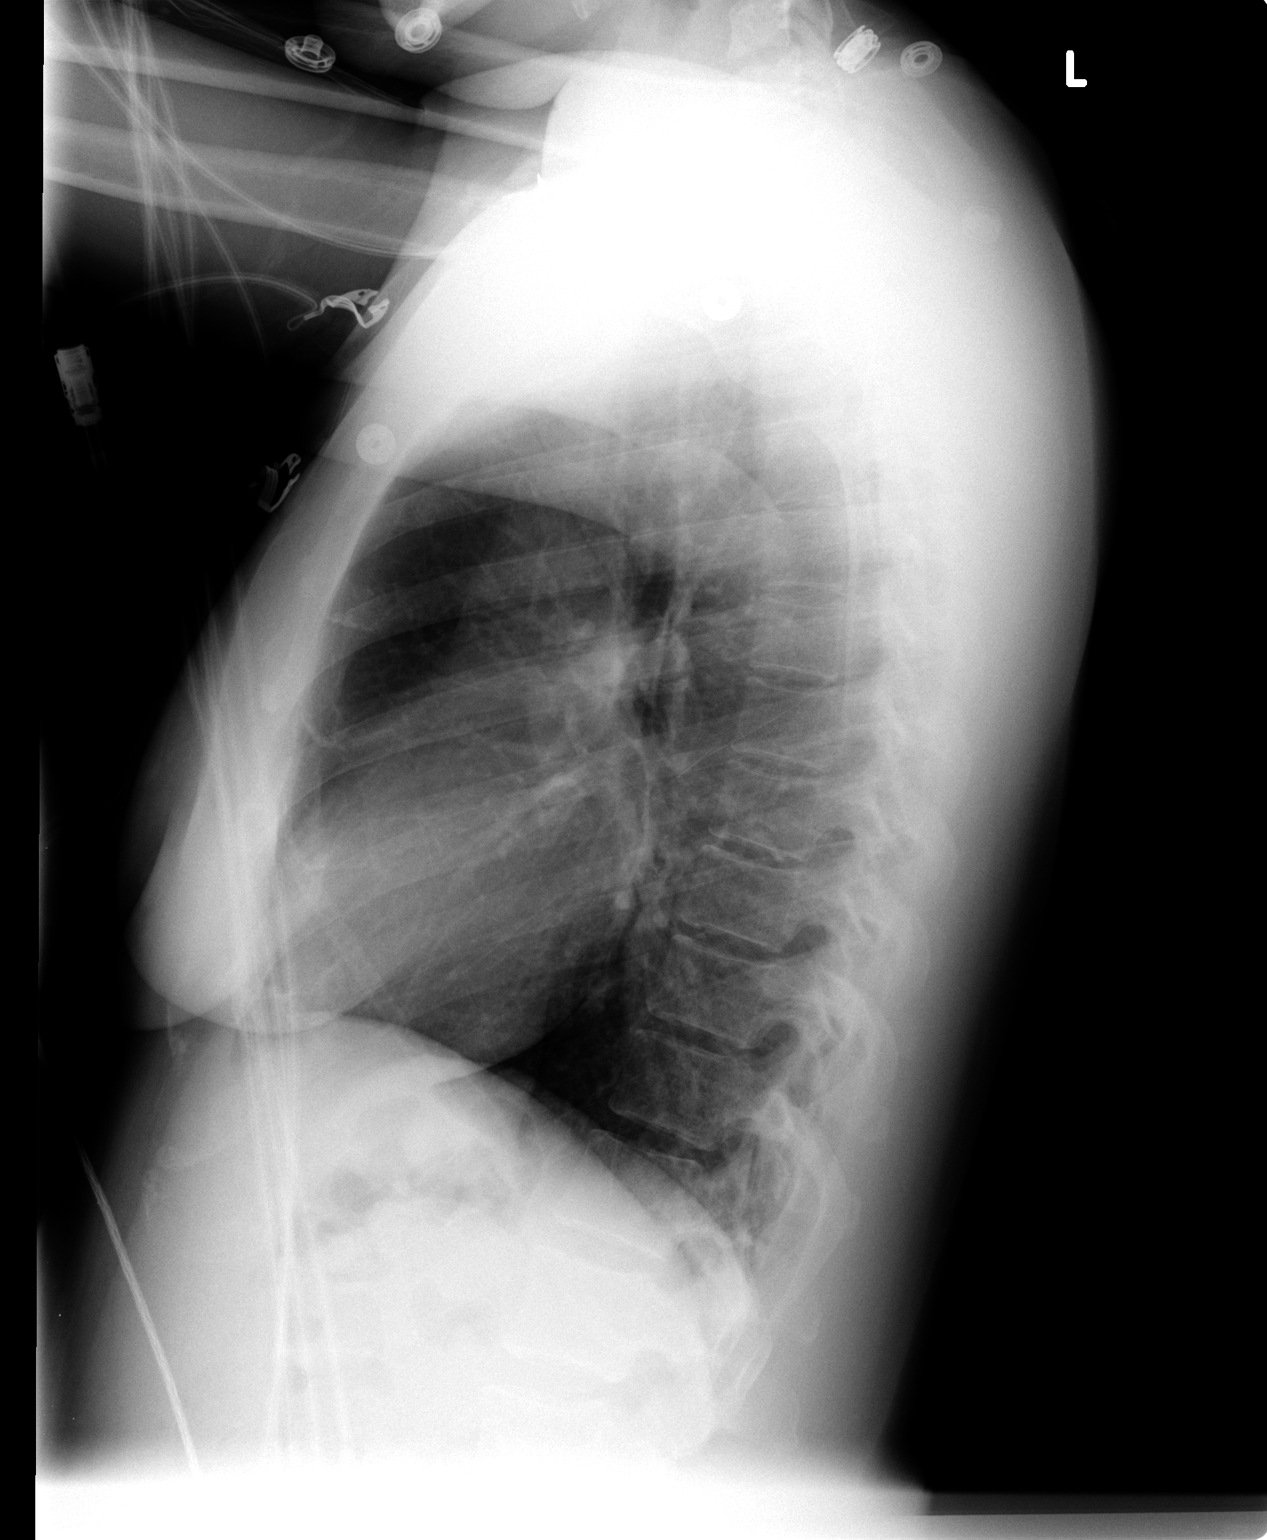

[2 of 2 positions shown; findings below may reference images not displayed]

FINDINGS: The heart size is normal.  The lungs are clear.  The
visualized soft tissues and bony thorax are unremarkable.
IMPRESSION: Negative two-view chest.

## 2012-07-23 MED ORDER — ONDANSETRON HCL 4 MG/2ML IJ SOLN
4.0000 mg | Freq: Once | INTRAMUSCULAR | Status: AC
  Administered 2012-07-23: 4 mg via INTRAVENOUS
  Filled 2012-07-23: qty 2

## 2012-07-23 MED ORDER — HYDROCODONE-ACETAMINOPHEN 5-325 MG PO TABS: 1.0000 | ORAL_TABLET | Freq: Four times a day (QID) | ORAL | Status: DC | PRN

## 2012-07-23 MED ORDER — SODIUM CHLORIDE 0.9 % IV BOLUS (SEPSIS)
250.0000 mL | Freq: Once | INTRAVENOUS | Status: AC
  Administered 2012-07-23: 250 mL via INTRAVENOUS

## 2012-07-23 MED ORDER — HYDROMORPHONE HCL PF 1 MG/ML IJ SOLN
1.0000 mg | Freq: Once | INTRAMUSCULAR | Status: AC
  Administered 2012-07-23: 1 mg via INTRAVENOUS
  Filled 2012-07-23: qty 1

## 2012-07-23 MED ORDER — CEPHALEXIN 500 MG PO CAPS: 500.0000 mg | ORAL_CAPSULE | Freq: Four times a day (QID) | ORAL | Status: DC

## 2012-07-23 MED ORDER — SODIUM CHLORIDE 0.9 % IV SOLN
INTRAVENOUS | Status: DC
  Administered 2012-07-23: 09:00:00 via INTRAVENOUS

## 2012-07-23 NOTE — ED Notes (Addendum)
Appears in no acute distress.  Reports numbness and tingling in her right hand and arm at her IV site.  IV flushed with normal saline with improvement of symptoms related to IV site.  The patient states that she is having abdominal pain and points to the epigastric area.

## 2012-07-23 NOTE — ED Notes (Signed)
Patient complaining of chest pain radiating into her back starting approximately 2 hours ago.

## 2012-07-23 NOTE — ED Notes (Signed)
The patient states that her abdominal pain has changed, states now her pain is located in her lower mid quadrant.  The patient is rocking back and forth upon entry to exam room, stating that she needs pain medication.

## 2012-07-23 NOTE — ED Provider Notes (Signed)
History    This chart was scribed for Shelda Jakes, MD by Leone Payor, ED Scribe. This patient was seen in room [facility] and the patient's care was started 7:11 AM.  CSN: [id number] Arrival date & time 07/23/12  0605  First MD Initiated Contact with Patient 07/23/12 [phone]     Chief Complaint  Patient presents with  . Chest Pain    The history is provided by the patient. No language interpreter was used.    HPI Comments: Kimberly Rocha is a 37 y.o. female who presents to the Emergency Department complaining of substernal chest pain and epigastric pain that radiates into her lower back starting about 2:30am early this morning. At worst it was 10/10 and currently is 6/10. She has had similar pain in the past but was never evaluated. She describes the pain as sharp. States usually she is able to move around and ease the pain but that did not work this time. She denies nausea, vomiting, diarrhea. She has surgical h/o hernia repair and cesarean section.     History reviewed. No pertinent past medical history. Past Surgical History  Procedure Laterality Date  . Hernia repair    . Cesarean section     History reviewed. No pertinent family history. History  Substance Use Topics  . Smoking status: Current Every Day Smoker -- 0.50 packs/day    Types: Cigarettes  . Smokeless tobacco: Not on file  . Alcohol Use: Yes     Comment: occ. use   OB History   Grav Para Term Preterm Abortions TAB SAB Ect Mult Living                 Review of Systems  Constitutional: Positive for fever. Negative for chills.  HENT: Negative for congestion and sore throat.   Eyes: Negative for visual disturbance.  Respiratory: Positive for shortness of breath. Negative for cough.   Cardiovascular: Positive for chest pain. Negative for leg swelling.  Gastrointestinal: Positive for abdominal pain. Negative for nausea, vomiting and diarrhea.  Genitourinary: Negative for dysuria and hematuria.   Musculoskeletal: Positive for back pain. Negative for joint swelling.  Skin: Negative for rash.  Neurological: Positive for headaches.  Hematological: Does not bruise/bleed easily.  Psychiatric/Behavioral: Negative for confusion.    Allergies  Demerol  Home Medications   Current Outpatient Rx  Name  Route  Sig  Dispense  Refill  . ibuprofen (ADVIL,MOTRIN) 200 MG tablet   Oral   Take 400 mg by mouth every 6 (six) hours as needed for pain.         . cephALEXin (KEFLEX) 500 MG capsule   Oral   Take 1 capsule (500 mg total) by mouth 4 (four) times daily.   28 capsule   0   . HYDROcodone-acetaminophen (NORCO/VICODIN) 5-325 MG per tablet   Oral   Take 1-2 tablets by mouth every 6 (six) hours as needed for pain.   14 tablet   0    BP 114/82  Pulse 75  Temp(Src) 98.5 F (36.9 C) (Oral)  Resp 21  Ht 5' (1.524 m)  Wt 132 lb (59.875 kg)  BMI 25.78 kg/m2  SpO2 98%  LMP 06/01/2012 Physical Exam  Nursing note and vitals reviewed. Constitutional: She is oriented to person, place, and time. She appears well-developed and well-nourished. No distress.  HENT:  Head: Normocephalic and atraumatic.  Mouth/Throat: Oropharynx is clear and moist. Mucous membranes are not dry.  Eyes: EOM are normal.  Neck: Neck  supple. No tracheal deviation present.  Cardiovascular: Normal rate, regular rhythm and normal heart sounds.   Pulmonary/Chest: Effort normal and breath sounds normal. No respiratory distress. She has no wheezes. She has no rales.  Abdominal: Soft. Bowel sounds are normal. There is tenderness. There is no rebound and no guarding.  Tender in the epigastric area.   Musculoskeletal: Normal range of motion. She exhibits no edema.  No swelling in bilateral ankles.   Neurological: She is alert and oriented to person, place, and time.  Skin: Skin is warm and dry.  Psychiatric: She has a normal mood and affect. Her behavior is normal.    ED Course  Procedures (including critical  care time)  DIAGNOSTIC STUDIES: Oxygen Saturation is 100% on RA, normal by my interpretation.    COORDINATION OF CARE: 7:28 AM Discussed treatment plan with pt at bedside and pt agreed to plan.   Labs Reviewed  CBC WITH DIFFERENTIAL - Abnormal; Notable for the following:    WBC 15.7 (*)    Neutro Abs 9.7 (*)    Lymphs Abs 4.9 (*)    All other components within normal limits  COMPREHENSIVE METABOLIC PANEL - Abnormal; Notable for the following:    Potassium 3.2 (*)    Total Bilirubin 0.2 (*)    GFR calc non Af Amer 73 (*)    GFR calc Af Amer 84 (*)    All other components within normal limits  URINALYSIS, ROUTINE W REFLEX MICROSCOPIC - Abnormal; Notable for the following:    Hgb urine dipstick TRACE (*)    Leukocytes, UA SMALL (*)    All other components within normal limits  URINE MICROSCOPIC-ADD ON - Abnormal; Notable for the following:    Squamous Epithelial / LPF FEW (*)    Bacteria, UA FEW (*)    All other components within normal limits  URINE CULTURE  LIPASE, BLOOD   Dg Chest 2 View  07/23/2012   *RADIOLOGY REPORT*  Clinical Data: Chest pain.  CHEST - 2 VIEW  Comparison: Two-view chest 10/13/2010  Findings: The heart size is normal.  The lungs are clear.  The visualized soft tissues and bony thorax are unremarkable.  IMPRESSION: Negative two-view chest.   Original Report Authenticated By: Marin Roberts, M.D.   US Abdomen Complete  07/23/2012   *RADIOLOGY REPORT*  Clinical Data:  Chest pain  ABDOMINAL ULTRASOUND COMPLETE  Comparison:  None.  Findings:  Gallbladder:  Multiple gallstones are present.  The largest is 1.2 cm.  No wall thickening or pericholecystic fluid.  No Murphy's sign.  Common Bile Duct:  Within normal limits in caliber.  Liver: No focal mass lesion identified.  Within normal limits in parenchymal echogenicity.  IVC:  Appears normal.  Pancreas:  No abnormality identified.  Spleen:  Within normal limits in size and echotexture. Small 1.1 cm splenule is  noted.  Right kidney:  Normal in size and parenchymal echogenicity.  No evidence of mass or hydronephrosis.  Left kidney:  Normal in size and parenchymal echogenicity.  No evidence of mass or hydronephrosis.  Abdominal Aorta:  No aneurysm identified.  IMPRESSION: Cholelithiasis.   Original Report Authenticated By: Jolaine Click, M.D.    Results for orders placed during the hospital encounter of 07/23/12  CBC WITH DIFFERENTIAL      Result Value Range   WBC 15.7 (*) 4.0 - 10.5 K/uL   RBC 3.97  3.87 - 5.11 MIL/uL   Hemoglobin 13.4  12.0 - 15.0 g/dL   HCT 38.7  36.0 - 46.0 %   MCV 97.5  78.0 - 100.0 fL   MCH 33.8  26.0 - 34.0 pg   MCHC 34.6  30.0 - 36.0 g/dL   RDW 16.1  09.6 - 04.5 %   Platelets 203  150 - 400 K/uL   Neutrophils Relative % 62  43 - 77 %   Neutro Abs 9.7 (*) 1.7 - 7.7 K/uL   Lymphocytes Relative 31  12 - 46 %   Lymphs Abs 4.9 (*) 0.7 - 4.0 K/uL   Monocytes Relative 6  3 - 12 %   Monocytes Absolute 0.9  0.1 - 1.0 K/uL   Eosinophils Relative 1  0 - 5 %   Eosinophils Absolute 0.1  0.0 - 0.7 K/uL   Basophils Relative 0  0 - 1 %   Basophils Absolute 0.0  0.0 - 0.1 K/uL  COMPREHENSIVE METABOLIC PANEL      Result Value Range   Sodium 138  135 - 145 mEq/L   Potassium 3.2 (*) 3.5 - 5.1 mEq/L   Chloride 103  96 - 112 mEq/L   CO2 28  19 - 32 mEq/L   Glucose, Bld 94  70 - 99 mg/dL   BUN 10  6 - 23 mg/dL   Creatinine, Ser 4.09  0.50 - 1.10 mg/dL   Calcium 9.5  8.4 - 81.1 mg/dL   Total Protein 7.4  6.0 - 8.3 g/dL   Albumin 3.9  3.5 - 5.2 g/dL   AST 15  0 - 37 U/L   ALT 10  0 - 35 U/L   Alkaline Phosphatase 79  39 - 117 U/L   Total Bilirubin 0.2 (*) 0.3 - 1.2 mg/dL   GFR calc non Af Amer 73 (*) >90 mL/min   GFR calc Af Amer 84 (*) >90 mL/min  LIPASE, BLOOD      Result Value Range   Lipase 47  11 - 59 U/L  URINALYSIS, ROUTINE W REFLEX MICROSCOPIC      Result Value Range   Color, Urine YELLOW  YELLOW   APPearance CLEAR  CLEAR   Specific Gravity, Urine 1.025  1.005 - 1.030    pH 6.5  5.0 - 8.0   Glucose, UA NEGATIVE  NEGATIVE mg/dL   Hgb urine dipstick TRACE (*) NEGATIVE   Bilirubin Urine NEGATIVE  NEGATIVE   Ketones, ur NEGATIVE  NEGATIVE mg/dL   Protein, ur NEGATIVE  NEGATIVE mg/dL   Urobilinogen, UA 0.2  0.0 - 1.0 mg/dL   Nitrite NEGATIVE  NEGATIVE   Leukocytes, UA SMALL (*) NEGATIVE  URINE MICROSCOPIC-ADD ON      Result Value Range   Squamous Epithelial / LPF FEW (*) RARE   WBC, UA 3-6  <3 WBC/hpf   RBC / HPF 0-2  <3 RBC/hpf   Bacteria, UA FEW (*) RARE   Urine-Other MUCOUS PRESENT         Date: 07/23/2012  Rate: 90  Rhythm: normal sinus rhythm  QRS Axis: normal  Intervals: normal  ST/T Wave abnormalities: normal  Conduction Disutrbances:none  Narrative Interpretation:   Old EKG Reviewed: none available     1. Cholelithiasis   2. UTI (lower urinary tract infection)     MDM  Patient symptoms most likely consistent with cholelithiasis biliary colic. No evidence of acute cholecystitis. Patient improved significantly in the emergency apartment no more abdominal pain. Urinalysis with questionable urinary tract infection will treat with an antibiotic for that. Patient given referral to general surgery for  consideration for removal the gallbladder.  I personally performed the services described in this documentation, which was scribed in my presence. The recorded information has been reviewed and is accurate.       Shelda Jakes, MD 07/23/12 1045

## 2012-07-24 LAB — URINE CULTURE
Colony Count: NO GROWTH
Culture: NO GROWTH

## 2012-08-16 ENCOUNTER — Emergency Department (HOSPITAL_COMMUNITY)
Admission: EM | Admit: 2012-08-16 | Discharge: 2012-08-16 | Disposition: A | Discharge status: Home / Self Care | Payer: Medicaid Other | Attending: Emergency Medicine | Admitting: Emergency Medicine

## 2012-08-16 ENCOUNTER — Encounter (HOSPITAL_COMMUNITY): Payer: Self-pay | Admitting: Emergency Medicine

## 2012-08-16 DIAGNOSIS — F17200 Nicotine dependence, unspecified, uncomplicated: Secondary | ICD-10-CM | POA: Insufficient documentation

## 2012-08-16 DIAGNOSIS — Z8719 Personal history of other diseases of the digestive system: Secondary | ICD-10-CM | POA: Insufficient documentation

## 2012-08-16 DIAGNOSIS — R109 Unspecified abdominal pain: Secondary | ICD-10-CM | POA: Insufficient documentation

## 2012-08-16 HISTORY — DX: Calculus of gallbladder without cholecystitis without obstruction: K80.20

## 2012-08-16 LAB — CBC WITH DIFFERENTIAL/PLATELET
Basophils Absolute: 0 10*3/uL (ref 0.0–0.1)
Basophils Relative: 0 % (ref 0–1)
Eosinophils Absolute: 0.1 10*3/uL (ref 0.0–0.7)
Eosinophils Relative: 1 % (ref 0–5)
HCT: 39.7 % (ref 36.0–46.0)
Hemoglobin: 14.3 g/dL (ref 12.0–15.0)
Lymphocytes Relative: 34 % (ref 12–46)
Lymphs Abs: 4 10*3/uL (ref 0.7–4.0)
MCH: 34.1 pg — ABNORMAL HIGH (ref 26.0–34.0)
MCHC: 36 g/dL (ref 30.0–36.0)
MCV: 94.7 fL (ref 78.0–100.0)
Monocytes Absolute: 1 10*3/uL (ref 0.1–1.0)
Monocytes Relative: 8 % (ref 3–12)
Neutro Abs: 6.8 10*3/uL (ref 1.7–7.7)
Neutrophils Relative %: 57 % (ref 43–77)
Platelets: 192 10*3/uL (ref 150–400)
RBC: 4.19 MIL/uL (ref 3.87–5.11)
RDW: 13 % (ref 11.5–15.5)
WBC: 11.8 10*3/uL — ABNORMAL HIGH (ref 4.0–10.5)

## 2012-08-16 LAB — COMPREHENSIVE METABOLIC PANEL
ALT: 9 U/L (ref 0–35)
AST: 13 U/L (ref 0–37)
Albumin: 3.8 g/dL (ref 3.5–5.2)
Alkaline Phosphatase: 81 U/L (ref 39–117)
BUN: 7 mg/dL (ref 6–23)
CO2: 26 mEq/L (ref 19–32)
Calcium: 9.6 mg/dL (ref 8.4–10.5)
Chloride: 104 mEq/L (ref 96–112)
Creatinine, Ser: 0.83 mg/dL (ref 0.50–1.10)
GFR calc Af Amer: >90 mL/min (ref >90)
GFR calc non Af Amer: 89 mL/min — ABNORMAL LOW (ref >90)
Glucose, Bld: 109 mg/dL — ABNORMAL HIGH (ref 70–99)
Potassium: 4 mEq/L (ref 3.5–5.1)
Sodium: 138 mEq/L (ref 135–145)
Total Bilirubin: 0.3 mg/dL (ref 0.3–1.2)
Total Protein: 7.6 g/dL (ref 6.0–8.3)

## 2012-08-16 LAB — LIPASE, BLOOD: Lipase: 36 U/L (ref 11–59)

## 2012-08-16 MED ORDER — HYDROMORPHONE HCL PF 1 MG/ML IJ SOLN
1.0000 mg | Freq: Once | INTRAMUSCULAR | Status: AC
  Administered 2012-08-16: 1 mg via INTRAVENOUS
  Filled 2012-08-16: qty 1

## 2012-08-16 MED ORDER — PROMETHAZINE HCL 25 MG PO TABS: 25.0000 mg | ORAL_TABLET | Freq: Four times a day (QID) | ORAL | Status: DC | PRN

## 2012-08-16 MED ORDER — OXYCODONE-ACETAMINOPHEN 5-325 MG PO TABS: 2.0000 | ORAL_TABLET | ORAL | Status: DC | PRN

## 2012-08-16 MED ORDER — ONDANSETRON HCL 4 MG/2ML IJ SOLN
4.0000 mg | Freq: Once | INTRAMUSCULAR | Status: AC
  Administered 2012-08-16: 4 mg via INTRAVENOUS
  Filled 2012-08-16: qty 2

## 2012-08-16 MED ORDER — SODIUM CHLORIDE 0.9 % IV BOLUS (SEPSIS)
500.0000 mL | Freq: Once | INTRAVENOUS | Status: AC
  Administered 2012-08-16: 500 mL via INTRAVENOUS

## 2012-08-16 NOTE — ED Provider Notes (Signed)
   History    CSN: [id number] Arrival date & time 08/16/12  0403  First MD Initiated Contact with Patient 08/16/12 [phone]     Chief Complaint  Patient presents with  . Abdominal Pain   (Consider location/radiation/quality/duration/timing/severity/associated sxs/prior Treatment) HPI.... epigastric pain starting approximately one hour ago without radiation. Abdominal ultrasound from 07/23/2012 shows multiple gallstones. Pain is moderate. No nausea vomiting or diarrhea. Nothing makes symptoms better or worse. Past Medical History  Diagnosis Date  . Gallstones    Past Surgical History  Procedure Laterality Date  . Hernia repair    . Cesarean section     History reviewed. No pertinent family history. History  Substance Use Topics  . Smoking status: Current Every Day Smoker -- 0.50 packs/day    Types: Cigarettes  . Smokeless tobacco: Not on file  . Alcohol Use: Yes     Comment: occ. use   OB History   Grav Para Term Preterm Abortions TAB SAB Ect Mult Living                 Review of Systems  All other systems reviewed and are negative.    Allergies  Demerol  Home Medications   Current Outpatient Rx  Name  Route  Sig  Dispense  Refill  . cephALEXin (KEFLEX) 500 MG capsule   Oral   Take 1 capsule (500 mg total) by mouth 4 (four) times daily.   28 capsule   0   . HYDROcodone-acetaminophen (NORCO/VICODIN) 5-325 MG per tablet   Oral   Take 1-2 tablets by mouth every 6 (six) hours as needed for pain.   14 tablet   0   . ibuprofen (ADVIL,MOTRIN) 200 MG tablet   Oral   Take 400 mg by mouth every 6 (six) hours as needed for pain.          BP 115/78  Pulse 90  Temp(Src) 98.3 F (36.8 C) (Oral)  Resp 20  Ht 5\' 1"  (1.549 m)  Wt 131 lb (59.421 kg)  BMI 24.76 kg/m2  SpO2 100%  LMP 06/26/2012 Physical Exam  Nursing note and vitals reviewed. Constitutional: She is oriented to person, place, and time. She appears well-developed and well-nourished.  HENT:  Head:  Normocephalic and atraumatic.  Eyes: Conjunctivae and EOM are normal. Pupils are equal, round, and reactive to light.  Neck: Normal range of motion. Neck supple.  Cardiovascular: Normal rate, regular rhythm and normal heart sounds.   Pulmonary/Chest: Effort normal and breath sounds normal.  Abdominal:  Minimal upper abdominal tenderness  Musculoskeletal: Normal range of motion.  Neurological: She is alert and oriented to person, place, and time.  Skin: Skin is warm and dry.  Psychiatric: She has a normal mood and affect.    ED Course  Procedures (including critical care time) Labs Reviewed  CBC WITH DIFFERENTIAL - Abnormal; Notable for the following:    WBC 11.8 (*)    MCH 34.1 (*)    All other components within normal limits  COMPREHENSIVE METABOLIC PANEL - Abnormal; Notable for the following:    Glucose, Bld 109 (*)    GFR calc non Af Amer 89 (*)    All other components within normal limits  LIPASE, BLOOD   No results found. 1. Abdominal pain     MDM  No acute abdomen. Patient feels better after pain management. Referral to general surgery. Discharge meds Percocet and Phenergan 25 mg  Donnetta Hutching, MD 08/16/12 [phone]

## 2012-08-16 NOTE — ED Notes (Signed)
Patient with no complaints at this time. Respirations even and unlabored. Skin warm/dry. Discharge instructions reviewed with patient at this time. Patient given opportunity to voice concerns/ask questions. IV removed per policy and band-aid applied to site. Patient discharged at this time and left Emergency Department with steady gait.  

## 2012-08-16 NOTE — ED Notes (Signed)
Patient complaining of upper abdominal pain starting 1 hour ago. States "They told me 3 weeks ago I had gallstones but I missed my appointment last week."

## 2012-08-18 NOTE — H&P (Signed)
  NTS SOAP Note  Vital Signs:  Vitals as of: 08/18/2012: Systolic 112: Diastolic 79: Heart Rate 100: Temp 54F: Height 65ft 0in: Weight 131Lbs 0 Ounces: Pain Level 3: BMI 25.58  BMI : 25.58 kg/m2  Subjective: This 37 Years 35 Months old Female presents for of    ABDOMINAL PAIN : ,Has been having intermittent right upper quadrant abdominal pain with radiation to right flank, nausea, and fatty food intolerance for the past few months.  U/S of gallbladder shows cholelithiasis, normal common bile duct.  Symptoms are increasing in frequency.  No fever, chills, jaundice.  Review of Symptoms:  Constitutional:  fatigue    headache Eyes:  pain bilateral Nose/Mouth/Throat:unremarkable Cardiovascular:  unremarkable   Respiratory:unremarkable   Gastrointestin    abdominal pain,nausea,vomiting,heartburn Genitourinary:    frequency   back pain Skin:unremarkable Hematolgic/Lymphatic:unremarkable     Allergic/Immunologic:unremarkable     Past Medical History:    Reviewed   Past Medical History  Surgical History: herniorrhaphy, BTL, c-section Medical Problems: none Allergies: demerol Medications: percocet, compazine   Social History:Reviewed  Social History  Preferred Language: English Race:  Black or African American Ethnicity: Not Hispanic / Latino Age: 37 Years 9 Months Marital Status:  S Alcohol: socially Recreational drug(s):  No   Smoking Status: Current every day smoker reviewed on 08/18/2012 Started Date: 01/26/1993 Packs per day: 1.00 Functional Status reviewed on mm/dd/yyyy ------------------------------------------------ Bathing: Normal Cooking: Normal Dressing: Normal Driving: Normal Eating: Normal Managing Meds: Normal Oral Care: Normal Shopping: Normal Toileting: Normal Transferring: Normal Walking: Normal Cognitive Status reviewed on mm/dd/yyyy ------------------------------------------------ Attention:  Normal Decision Making: Normal Language: Normal Memory: Normal Motor: Normal Perception: Normal Problem Solving: Normal Visual and Spatial: Normal   Family History:  Reviewed  Family Health History Mother, Living; Healthy; healthy Father, Living; Healthy; CAD    Objective Information: General:  Well appearing, well nourished in no distress.   no scleral icterus Heart:  RRR, no murmur Lungs:    CTA bilaterally, no wheezes, rhonchi, rales.  Breathing unlabored. Abdomen:Soft, NT/ND, no HSM, no masses.  Tender in right upper quadrant to deep palpation.  No rigidity noted.  Assessment:Biliary colic, cholelithiasis  Diagnosis &amp; Procedure Smart Code   Plan:Scheduled for laparoscopic cholecystectomy on 08/22/12.   Patient Education:Alternative treatments to surgery were discussed with patient (and family).  Risks and benefits  of procedure including bleeding, infection, hepatobiliary injury, and the possibility of an open procedure were fully explained to the patient (and family) who gave informed consent. Patient/family questions were addressed.  Follow-up:Pending Surgery

## 2012-08-19 ENCOUNTER — Encounter (HOSPITAL_COMMUNITY)
Admission: RE | Admit: 2012-08-19 | Discharge: 2012-08-19 | Disposition: A | Discharge status: Home / Self Care | Payer: Self-pay | Source: Ambulatory Visit | Attending: General Surgery | Admitting: General Surgery

## 2012-08-19 ENCOUNTER — Encounter (HOSPITAL_COMMUNITY): Payer: Self-pay

## 2012-08-19 MED ORDER — CHLORHEXIDINE GLUCONATE 4 % EX LIQD: 1.0000 "application " | Freq: Once | CUTANEOUS | Status: DC

## 2012-08-19 NOTE — Patient Instructions (Signed)
Kimberly Rocha  08/19/2012   Your procedure is scheduled on:  Monday, 08/22/12  Report to [facility] at 0630 AM.  Call this number if you have problems the morning of surgery: [phone]   Remember:   Do not eat food or drink liquids after midnight.   Take these medicines the morning of surgery with A SIP OF WATER: percocet and/or promethazine if needed   Do not wear jewelry, make-up or nail polish.  Do not wear lotions, powders, or perfumes. You may wear deodorant.  Do not shave 48 hours prior to surgery. Men may shave face and neck.  Do not bring valuables to the hospital.  [facility] is not responsible                   for any belongings or valuables.  Contacts, dentures or bridgework may not be worn into surgery.  Leave suitcase in the car. After surgery it may be brought to your room.  For patients admitted to the hospital, checkout time is 11:00 AM the day of  discharge.   Patients discharged the day of surgery will not be allowed to drive  home.  Name and phone number of your driver: [name]  Special Instructions: Shower using CHG 2 nights before surgery and the night before surgery.  If you shower the day of surgery use CHG.  Use special wash - you have one bottle of CHG for all showers.  You should use approximately 1/3 of the bottle for each shower.   Please read over the following fact sheets that you were given: Pain Booklet, Anesthesia Post-op Instructions and Care and Recovery After Surgery   Laparoscopic Cholecystectomy Laparoscopic cholecystectomy is surgery to remove the gallbladder. The gallbladder is located slightly to the right of center in the abdomen, behind the liver. It is a concentrating and storage sac for the bile produced in the liver. Bile aids in the digestion and absorption of fats. Gallbladder disease (cholecystitis) is an inflammation of your gallbladder. This condition is usually caused by a buildup of gallstones (cholelithiasis) in your  gallbladder. Gallstones can block the flow of bile, resulting in inflammation and pain. In severe cases, emergency surgery may be required. When emergency surgery is not required, you will have time to prepare for the procedure. Laparoscopic surgery is an alternative to open surgery. Laparoscopic surgery usually has a shorter recovery time. Your common bile duct may also need to be examined and explored. Your caregiver will discuss this with you if he or she feels this should be done. If stones are found in the common bile duct, they may be removed. LET YOUR CAREGIVER KNOW ABOUT:  Allergies to food or medicine.  Medicines taken, including vitamins, herbs, eyedrops, over-the-counter medicines, and creams.  Use of steroids (by mouth or creams).  Previous problems with anesthetics or numbing medicines.  History of bleeding problems or blood clots.  Previous surgery.  Other health problems, including diabetes and kidney problems.  Possibility of pregnancy, if this applies. RISKS AND COMPLICATIONS All surgery is associated with risks. Some problems that may occur following this procedure include:  Infection.  Damage to the common bile duct, nerves, arteries, veins, or other internal organs such as the stomach or intestines.  Bleeding.  A stone may remain in the common bile duct. BEFORE THE PROCEDURE  Do not take aspirin for 3 days prior to surgery or blood thinners for 1 week prior to surgery.  Do not eat or drink  anything after midnight the night before surgery.  Let your caregiver know if you develop a cold or other infectious problem prior to surgery.  You should be present 60 minutes before the procedure or as directed. PROCEDURE  You will be given medicine that makes you sleep (general anesthetic). When you are asleep, your surgeon will make several small cuts (incisions) in your abdomen. One of these incisions is used to insert a small, lighted scope (laparoscope) into the  abdomen. The laparoscope helps the surgeon see into your abdomen. Carbon dioxide gas will be pumped into your abdomen. The gas allows more room for the surgeon to perform your surgery. Other operating instruments are inserted through the other incisions. Laparoscopic procedures may not be appropriate when:  There is major scarring from previous surgery.  The gallbladder is extremely inflamed.  There are bleeding disorders or unexpected cirrhosis of the liver.  A pregnancy is near term.  Other conditions make the laparoscopic procedure impossible. If your surgeon feels it is not safe to continue with a laparoscopic procedure, he or she will perform an open abdominal procedure. In this case, the surgeon will make an incision to open the abdomen. This gives the surgeon a larger view and field to work within. This may allow the surgeon to perform procedures that sometimes cannot be performed with a laparoscope alone. Open surgery has a longer recovery time. AFTER THE PROCEDURE  You will be taken to the recovery area where a nurse will watch and check your progress.  You may be allowed to go home the same day.  Do not resume physical activities until directed by your caregiver.  You may resume a normal diet and activities as directed. Document Released: 01/12/2005 Document Revised: 04/06/2011 Document Reviewed: 06/27/2010 [facility] Patient Information 2014 [place], [place]. Laparoscopic Cholecystectomy Care After Refer to this sheet in the next few weeks. These instructions provide you with information on caring for yourself after your procedure. Your caregiver may also give you more specific instructions. Your treatment has been planned according to current medical practices, but problems sometimes occur. Call your caregiver if you have any problems or questions after your procedure. HOME CARE INSTRUCTIONS   Change bandages (dressings) as directed by your caregiver.  Keep the wound dry and  clean. The wound may be washed gently with soap and water. Gently blot or dab the area dry.  Do not take baths or use swimming pools or hot tubs for 10 days, or as instructed by your caregiver.  Only take over-the-counter or prescription medicines for pain, discomfort, or fever as directed by your caregiver.  Continue your normal diet as directed by your caregiver.  Do not lift anything heavier than 25 pounds (11.5 kg), or as directed by your caregiver.  Do not play contact sports for 1 week, or as directed by your caregiver. SEEK MEDICAL CARE IF:   There is redness, swelling, or increasing pain in the wound.  You notice yellowish-white fluid (pus) coming from the wound.  There is drainage from the wound that lasts longer than 1 day.  There is a bad smell coming from the wound or dressing.  The surgical cut (incision) breaks open. SEEK IMMEDIATE MEDICAL CARE IF:   You develop a rash.  You have difficulty breathing.  You develop chest pain.  You develop any reaction or side effects to medicines given.  You have a fever.  You have increasing pain in the shoulders (shoulder strap areas).  You have dizzy episodes or  faint while standing.  You develop severe abdominal pain.  You feel sick to your stomach (nauseous) or throw up (vomit) and this lasts for more than 1 day. MAKE SURE YOU:   Understand these instructions.  Will watch your condition.  Will get help right away if you are not doing well or get worse. Document Released: 01/12/2005 Document Revised: 04/06/2011 Document Reviewed: 06/27/2010 [facility] Patient Information 2014 [place], [place].  PATIENT INSTRUCTIONS POST-ANESTHESIA  IMMEDIATELY FOLLOWING SURGERY:  Do not drive or operate machinery for the first twenty four hours after surgery.  Do not make any important decisions for twenty four hours after surgery or while taking narcotic pain medications or sedatives.  If you develop intractable nausea and vomiting  or a severe headache please notify your doctor immediately.  FOLLOW-UP:  Please make an appointment with your surgeon as instructed. You do not need to follow up with anesthesia unless specifically instructed to do so.  WOUND CARE INSTRUCTIONS (if applicable):  Keep a dry clean dressing on the anesthesia/puncture wound site if there is drainage.  Once the wound has quit draining you may leave it open to air.  Generally you should leave the bandage intact for twenty four hours unless there is drainage.  If the epidural site drains for more than 36-48 hours please call the anesthesia department.  QUESTIONS?:  Please feel free to call your physician or the hospital operator if you have any questions, and they will be happy to assist you.

## 2012-08-22 ENCOUNTER — Encounter (HOSPITAL_COMMUNITY): Payer: Self-pay | Admitting: *Deleted

## 2012-08-22 ENCOUNTER — Encounter (HOSPITAL_COMMUNITY)
Admission: RE | Disposition: A | Discharge status: Home / Self Care | Payer: Self-pay | Source: Ambulatory Visit | Attending: General Surgery

## 2012-08-22 ENCOUNTER — Encounter (HOSPITAL_COMMUNITY): Payer: Self-pay | Admitting: Anesthesiology

## 2012-08-22 ENCOUNTER — Ambulatory Visit (HOSPITAL_COMMUNITY)
Admission: RE | Admit: 2012-08-22 | Discharge: 2012-08-22 | Disposition: A | Discharge status: Home / Self Care | Payer: Medicaid Other | Source: Ambulatory Visit | Attending: General Surgery | Admitting: General Surgery

## 2012-08-22 ENCOUNTER — Ambulatory Visit (HOSPITAL_COMMUNITY): Payer: Medicaid Other | Admitting: Anesthesiology

## 2012-08-22 DIAGNOSIS — K8010 Calculus of gallbladder with chronic cholecystitis without obstruction: Secondary | ICD-10-CM | POA: Insufficient documentation

## 2012-08-22 HISTORY — PX: CHOLECYSTECTOMY: SHX55

## 2012-08-22 SURGERY — LAPAROSCOPIC CHOLECYSTECTOMY
Anesthesia: General | Site: Abdomen | Wound class: Clean Contaminated

## 2012-08-22 MED ORDER — ROCURONIUM BROMIDE 100 MG/10ML IV SOLN
INTRAVENOUS | Status: DC | PRN
  Administered 2012-08-22: 30 mg via INTRAVENOUS

## 2012-08-22 MED ORDER — BUPIVACAINE HCL (PF) 0.5 % IJ SOLN
INTRAMUSCULAR | Status: AC
  Filled 2012-08-22: qty 30

## 2012-08-22 MED ORDER — NEOSTIGMINE METHYLSULFATE 1 MG/ML IJ SOLN
INTRAMUSCULAR | Status: AC
  Filled 2012-08-22: qty 1

## 2012-08-22 MED ORDER — GLYCOPYRROLATE 0.2 MG/ML IJ SOLN
INTRAMUSCULAR | Status: DC | PRN
  Administered 2012-08-22: 0.4 mg via INTRAVENOUS

## 2012-08-22 MED ORDER — ONDANSETRON HCL 4 MG/2ML IJ SOLN
INTRAMUSCULAR | Status: AC
  Filled 2012-08-22: qty 2

## 2012-08-22 MED ORDER — ONDANSETRON HCL 4 MG/2ML IJ SOLN: 4.0000 mg | Freq: Once | INTRAMUSCULAR | Status: DC | PRN

## 2012-08-22 MED ORDER — LACTATED RINGERS IV SOLN
INTRAVENOUS | Status: DC
  Administered 2012-08-22: 1000 mL via INTRAVENOUS

## 2012-08-22 MED ORDER — ENOXAPARIN SODIUM 40 MG/0.4ML ~~LOC~~ SOLN
SUBCUTANEOUS | Status: AC
  Filled 2012-08-22: qty 0.4

## 2012-08-22 MED ORDER — FENTANYL CITRATE 0.05 MG/ML IJ SOLN
INTRAMUSCULAR | Status: AC
  Filled 2012-08-22: qty 2

## 2012-08-22 MED ORDER — GLYCOPYRROLATE 0.2 MG/ML IJ SOLN
INTRAMUSCULAR | Status: AC
  Filled 2012-08-22: qty 2

## 2012-08-22 MED ORDER — CLINDAMYCIN PHOSPHATE 900 MG/50ML IV SOLN
900.0000 mg | Freq: Once | INTRAVENOUS | Status: AC
  Administered 2012-08-22 (×2): 900 mg via INTRAVENOUS

## 2012-08-22 MED ORDER — DEXAMETHASONE SODIUM PHOSPHATE 4 MG/ML IJ SOLN
4.0000 mg | Freq: Once | INTRAMUSCULAR | Status: AC
  Administered 2012-08-22: 4 mg via INTRAVENOUS

## 2012-08-22 MED ORDER — MIDAZOLAM HCL 2 MG/2ML IJ SOLN
1.0000 mg | INTRAMUSCULAR | Status: DC | PRN
  Administered 2012-08-22: 2 mg via INTRAVENOUS

## 2012-08-22 MED ORDER — FENTANYL CITRATE 0.05 MG/ML IJ SOLN
INTRAMUSCULAR | Status: DC | PRN
  Administered 2012-08-22: 50 ug via INTRAVENOUS
  Administered 2012-08-22: 100 ug via INTRAVENOUS
  Administered 2012-08-22 (×2): 50 ug via INTRAVENOUS

## 2012-08-22 MED ORDER — OXYCODONE-ACETAMINOPHEN 7.5-325 MG PO TABS: 1.0000 | ORAL_TABLET | ORAL | Status: DC | PRN

## 2012-08-22 MED ORDER — ROCURONIUM BROMIDE 50 MG/5ML IV SOLN
INTRAVENOUS | Status: AC
  Filled 2012-08-22: qty 1

## 2012-08-22 MED ORDER — SODIUM CHLORIDE 0.9 % IR SOLN
Status: DC | PRN
  Administered 2012-08-22: 1000 mL

## 2012-08-22 MED ORDER — CLINDAMYCIN PHOSPHATE 900 MG/50ML IV SOLN
INTRAVENOUS | Status: AC
  Filled 2012-08-22: qty 50

## 2012-08-22 MED ORDER — HEMOSTATIC AGENTS (NO CHARGE) OPTIME
TOPICAL | Status: DC | PRN
  Administered 2012-08-22: 1 via TOPICAL

## 2012-08-22 MED ORDER — MIDAZOLAM HCL 2 MG/2ML IJ SOLN
INTRAMUSCULAR | Status: AC
  Filled 2012-08-22: qty 2

## 2012-08-22 MED ORDER — ENOXAPARIN SODIUM 40 MG/0.4ML ~~LOC~~ SOLN
40.0000 mg | Freq: Once | SUBCUTANEOUS | Status: AC
  Administered 2012-08-22: 40 mg via SUBCUTANEOUS

## 2012-08-22 MED ORDER — LIDOCAINE HCL 1 % IJ SOLN
INTRAMUSCULAR | Status: DC | PRN
  Administered 2012-08-22: 50 mg via INTRADERMAL

## 2012-08-22 MED ORDER — ONDANSETRON HCL 4 MG/2ML IJ SOLN
4.0000 mg | Freq: Once | INTRAMUSCULAR | Status: AC
  Administered 2012-08-22: 4 mg via INTRAVENOUS

## 2012-08-22 MED ORDER — PROPOFOL 10 MG/ML IV EMUL
INTRAVENOUS | Status: AC
  Filled 2012-08-22: qty 20

## 2012-08-22 MED ORDER — FENTANYL CITRATE 0.05 MG/ML IJ SOLN
INTRAMUSCULAR | Status: AC
  Filled 2012-08-22: qty 5

## 2012-08-22 MED ORDER — PROPOFOL 10 MG/ML IV BOLUS
INTRAVENOUS | Status: DC | PRN
  Administered 2012-08-22: 130 mg via INTRAVENOUS

## 2012-08-22 MED ORDER — KETOROLAC TROMETHAMINE 30 MG/ML IJ SOLN
INTRAMUSCULAR | Status: AC
  Filled 2012-08-22: qty 1

## 2012-08-22 MED ORDER — FENTANYL CITRATE 0.05 MG/ML IJ SOLN
25.0000 ug | INTRAMUSCULAR | Status: DC | PRN
  Administered 2012-08-22: 25 ug via INTRAVENOUS

## 2012-08-22 MED ORDER — NEOSTIGMINE METHYLSULFATE 1 MG/ML IJ SOLN
INTRAMUSCULAR | Status: DC | PRN
  Administered 2012-08-22: 3 mg via INTRAVENOUS

## 2012-08-22 MED ORDER — DEXAMETHASONE SODIUM PHOSPHATE 4 MG/ML IJ SOLN
INTRAMUSCULAR | Status: AC
  Filled 2012-08-22: qty 1

## 2012-08-22 MED ORDER — LIDOCAINE HCL (PF) 1 % IJ SOLN
INTRAMUSCULAR | Status: AC
  Filled 2012-08-22: qty 5

## 2012-08-22 MED ORDER — BUPIVACAINE HCL (PF) 0.5 % IJ SOLN
INTRAMUSCULAR | Status: DC | PRN
  Administered 2012-08-22: 10 mL

## 2012-08-22 MED ORDER — KETOROLAC TROMETHAMINE 30 MG/ML IJ SOLN
30.0000 mg | Freq: Once | INTRAMUSCULAR | Status: AC
  Administered 2012-08-22: 30 mg via INTRAVENOUS

## 2012-08-22 SURGICAL SUPPLY — 36 items
APPLIER CLIP LAPSCP 10X32 DD (CLIP) ×2 IMPLANT
BAG HAMPER (MISCELLANEOUS) ×2 IMPLANT
CLOTH BEACON ORANGE TIMEOUT ST (SAFETY) ×2 IMPLANT
COVER LIGHT HANDLE STERIS (MISCELLANEOUS) ×4 IMPLANT
DECANTER SPIKE VIAL GLASS SM (MISCELLANEOUS) ×2 IMPLANT
DURAPREP 26ML APPLICATOR (WOUND CARE) ×2 IMPLANT
ELECT REM PT RETURN 9FT ADLT (ELECTROSURGICAL) ×2
ELECTRODE REM PT RTRN 9FT ADLT (ELECTROSURGICAL) ×1 IMPLANT
FILTER SMOKE EVAC LAPAROSHD (FILTER) ×2 IMPLANT
FORMALIN 10 PREFIL 120ML (MISCELLANEOUS) ×2 IMPLANT
GLOVE BIO SURGEON STRL SZ7.5 (GLOVE) ×2 IMPLANT
GLOVE BIOGEL PI IND STRL 7.0 (GLOVE) ×1 IMPLANT
GLOVE BIOGEL PI IND STRL 7.5 (GLOVE) ×1 IMPLANT
GLOVE BIOGEL PI INDICATOR 7.0 (GLOVE) ×1
GLOVE BIOGEL PI INDICATOR 7.5 (GLOVE) ×1
GLOVE ECLIPSE 6.5 STRL STRAW (GLOVE) ×2 IMPLANT
GLOVE ECLIPSE 7.0 STRL STRAW (GLOVE) ×2 IMPLANT
GLOVE EXAM NITRILE LRG STRL (GLOVE) ×2 IMPLANT
GOWN STRL REIN XL XLG (GOWN DISPOSABLE) ×6 IMPLANT
HEMOSTAT SNOW SURGICEL 2X4 (HEMOSTASIS) ×2 IMPLANT
INST SET LAPROSCOPIC AP (KITS) ×2 IMPLANT
KIT ROOM TURNOVER APOR (KITS) ×2 IMPLANT
KIT TROCAR LAP CHOLE (TROCAR) ×2 IMPLANT
MANIFOLD NEPTUNE II (INSTRUMENTS) ×2 IMPLANT
NS IRRIG 1000ML POUR BTL (IV SOLUTION) ×2 IMPLANT
PACK LAP CHOLE LZT030E (CUSTOM PROCEDURE TRAY) ×2 IMPLANT
PAD ARMBOARD 7.5X6 YLW CONV (MISCELLANEOUS) ×2 IMPLANT
POUCH SPECIMEN RETRIEVAL 10MM (ENDOMECHANICALS) ×2 IMPLANT
SET BASIN LINEN APH (SET/KITS/TRAYS/PACK) ×2 IMPLANT
SPONGE GAUZE 2X2 8PLY STRL LF (GAUZE/BANDAGES/DRESSINGS) ×8 IMPLANT
STAPLER VISISTAT (STAPLE) ×2 IMPLANT
SUT VICRYL 0 UR6 27IN ABS (SUTURE) ×2 IMPLANT
TAPE CLOTH SURG 4X10 WHT LF (GAUZE/BANDAGES/DRESSINGS) ×2 IMPLANT
TUBING INSUFFLATION (TUBING) ×2 IMPLANT
WARMER LAPAROSCOPE (MISCELLANEOUS) ×2 IMPLANT
YANKAUER SUCT 12FT TUBE ARGYLE (SUCTIONS) ×2 IMPLANT

## 2012-08-22 NOTE — Anesthesia Preprocedure Evaluation (Signed)
Anesthesia Evaluation  Patient identified by MRN, date of birth, ID band Patient awake    Reviewed: Allergy & Precautions, H&P , NPO status , Patient's Chart, lab work & pertinent test results  History of Anesthesia Complications Negative for: history of anesthetic complications  Airway Mallampati: II TM Distance: >3 FB     Dental  (+) Teeth Intact   Pulmonary neg pulmonary ROS, Current Smoker,  breath sounds clear to auscultation        Cardiovascular negative cardio ROS  Rhythm:Regular Rate:Normal     Neuro/Psych    GI/Hepatic negative GI ROS,   Endo/Other    Renal/GU      Musculoskeletal   Abdominal   Peds  Hematology   Anesthesia Other Findings   Reproductive/Obstetrics                           Anesthesia Physical Anesthesia Plan  ASA: I  Anesthesia Plan: General   Post-op Pain Management:    Induction: Intravenous  Airway Management Planned: Oral ETT  Additional Equipment:   Intra-op Plan:   Post-operative Plan: Extubation in OR  Informed Consent: I have reviewed the patients History and Physical, chart, labs and discussed the procedure including the risks, benefits and alternatives for the proposed anesthesia with the patient or authorized representative who has indicated his/her understanding and acceptance.     Plan Discussed with:   Anesthesia Plan Comments:         Anesthesia Quick Evaluation

## 2012-08-22 NOTE — Anesthesia Procedure Notes (Signed)
Procedure Name: Intubation Date/Time: 08/22/2012 7:39 AM Performed by: Despina Hidden Pre-anesthesia Checklist: Emergency Drugs available, Patient identified, Suction available and Patient being monitored Patient Re-evaluated:Patient Re-evaluated prior to inductionOxygen Delivery Method: Circle system utilized Preoxygenation: Pre-oxygenation with 100% oxygen Intubation Type: IV induction Ventilation: Mask ventilation without difficulty Laryngoscope Size: Mac and 3 Grade View: Grade I Tube type: Oral Tube size: 7.0 mm Number of attempts: 1 Airway Equipment and Method: Stylet Placement Confirmation: ETT inserted through vocal cords under direct vision,  breath sounds checked- equal and bilateral and positive ETCO2 Secured at: 22 cm Tube secured with: Tape Dental Injury: Teeth and Oropharynx as per pre-operative assessment

## 2012-08-22 NOTE — Interval H&P Note (Signed)
History and Physical Interval Note:  08/22/2012 7:25 AM  Kimberly Rocha  has presented today for surgery, with the diagnosis of gallstones  The various methods of treatment have been discussed with the patient and family. After consideration of risks, benefits and other options for treatment, the patient has consented to  Procedure(s): LAPAROSCOPIC CHOLECYSTECTOMY (N/A) as a surgical intervention .  The patient's history has been reviewed, patient examined, no change in status, stable for surgery.  I have reviewed the patient's chart and labs.  Questions were answered to the patient's satisfaction.     [name]

## 2012-08-22 NOTE — Op Note (Signed)
Patient:  Kimberly Rocha  DOB:  Jul 07, 1975  MRN:  [id number]   Preop Diagnosis:  Biliary colic, cholelithiasis  Postop Diagnosis:  Same  Procedure:  Laparoscopic cholecystectomy  Surgeon:  Franky Macho, M.D.  Anes:  General endotracheal  Indications:  Patient is a 37 year old white female presents with biliary colic secondary to cholelithiasis. The risks and benefits of the procedure including bleeding, infection, hepatobiliary, and the possibility of the procedure were fully explained to the patient, who gave informed consent.  Procedure note:  The patient was placed in the supine position. After induction of general endotracheal anesthesia, the abdomen was prepped and draped using usual sterile technique with DuraPrep. Surgical site confirmation was performed.  An infraumbilical incision was made down to the fascia. A Veress needle was introduced into the abdominal cavity and confirmation of placement was done using the saline drop test. The abdomen was then insufflated to 16 mm mercury pressure. An 11 mm trocar was introduced into the abdominal cavity under direct visualization without difficulty. The patient was placed in reverse Trendelenburg position and an additional 11 mm trocar was placed the epigastric region and 5 mm trochars were placed in the right upper quadrant and right flank regions. The liver was inspected and noted within normal limits. The gallbladder was retracted in a dynamic fashion in order to expose the triangle of Calot. The cystic duct was first identified. Its juncture to the infundibulum was fully identified. Endoclips placed proximally and distally on the cystic duct, and the cystic duct was divided. This was likewise done to the cystic artery. The gallbladder was then freed away from the gallbladder fossa using Bovie electrocautery. The gallbladder was delivered through the epigastric trocar site using an Endo Catch bag. The gallbladder fossa was inspected and no  abnormal bleeding or bile leakage was noted. Surgicel is placed the gallbladder fossa. All fluid and air were then evacuated from the abdominal cavity prior to removal of the trochars.  All wounds were gave normal saline. All wounds were injected with 0.5% Sensorcaine. The infraumbilical fashion as well as epigastric fascia were reapproximated using 0 Vicryl interrupted sutures. All skin incisions were closed using staples. Betadine ointment and dry sterile dressings were applied.  All tape and needle counts were correct at the end of the procedure. Patient was extubated in the operating room and transferred to PACU in stable condition.  Complications:  None  EBL:  Minimal  Specimen:  Gallbladder

## 2012-08-22 NOTE — Transfer of Care (Signed)
Immediate Anesthesia Transfer of Care Note  Patient: Kimberly Rocha  Procedure(s) Performed: Procedure(s): LAPAROSCOPIC CHOLECYSTECTOMY (N/A)  Patient Location: PACU  Anesthesia Type:General  Level of Consciousness: sedated and patient cooperative  Airway & Oxygen Therapy: Patient Spontanous Breathing and Patient connected to face mask oxygen  Post-op Assessment: Report given to PACU RN, Post -op Vital signs reviewed and stable and Patient moving all extremities  Post vital signs: Reviewed and stable  Complications: No apparent anesthesia complications

## 2012-08-22 NOTE — Anesthesia Postprocedure Evaluation (Signed)
  Anesthesia Post-op Note  Patient: Kimberly Rocha  Procedure(s) Performed: Procedure(s): LAPAROSCOPIC CHOLECYSTECTOMY (N/A)  Patient Location: PACU  Anesthesia Type:General  Level of Consciousness: awake, alert  and patient cooperative  Airway and Oxygen Therapy: Patient Spontanous Breathing  Post-op Pain: 2 /10, mild  Post-op Assessment: Post-op Vital signs reviewed, Patient's Cardiovascular Status Stable, Respiratory Function Stable, Patent Airway and Pain level controlled  Post-op Vital Signs: Reviewed and stable  Complications: No apparent anesthesia complications

## 2012-08-23 ENCOUNTER — Encounter (HOSPITAL_COMMUNITY): Payer: Self-pay | Admitting: General Surgery

## 2012-11-04 ENCOUNTER — Emergency Department (HOSPITAL_COMMUNITY)
Admission: EM | Admit: 2012-11-04 | Discharge: 2012-11-04 | Disposition: A | Discharge status: Home / Self Care | Payer: Self-pay | Attending: Emergency Medicine | Admitting: Emergency Medicine

## 2012-11-04 ENCOUNTER — Encounter (HOSPITAL_COMMUNITY): Payer: Self-pay | Admitting: Emergency Medicine

## 2012-11-04 ENCOUNTER — Emergency Department (HOSPITAL_COMMUNITY): Payer: MEDICAID

## 2012-11-04 DIAGNOSIS — J069 Acute upper respiratory infection, unspecified: Secondary | ICD-10-CM | POA: Insufficient documentation

## 2012-11-04 DIAGNOSIS — F17200 Nicotine dependence, unspecified, uncomplicated: Secondary | ICD-10-CM | POA: Insufficient documentation

## 2012-11-04 IMAGING — CR DG CHEST 2V
2 series · 2 of 2 positions shown · non-contrast
Comparison: [DATE].

CLINICAL DATA: Cough. Congestion. Right-sided muscle spasms. Fever.
Cigarette smoker.

EXAM:
CHEST  2 VIEW

[view not recorded (1 of 2)]
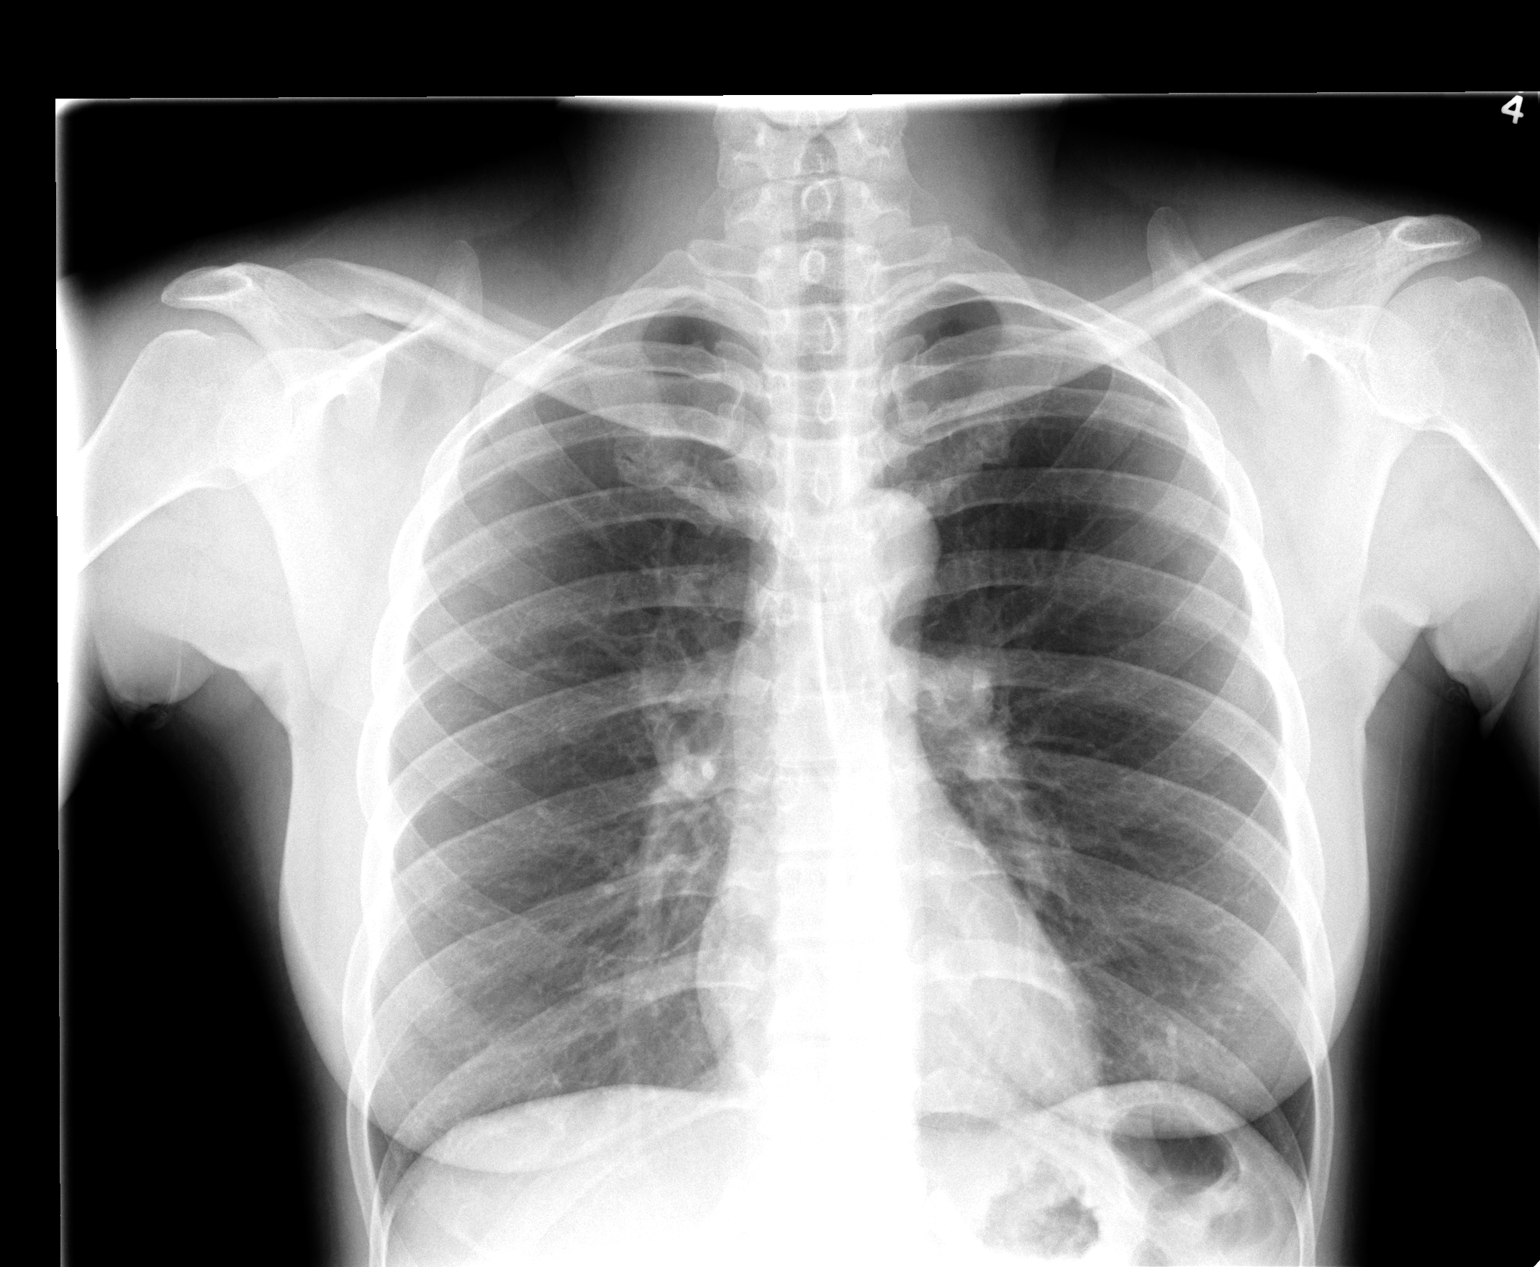

[view not recorded (2 of 2)]
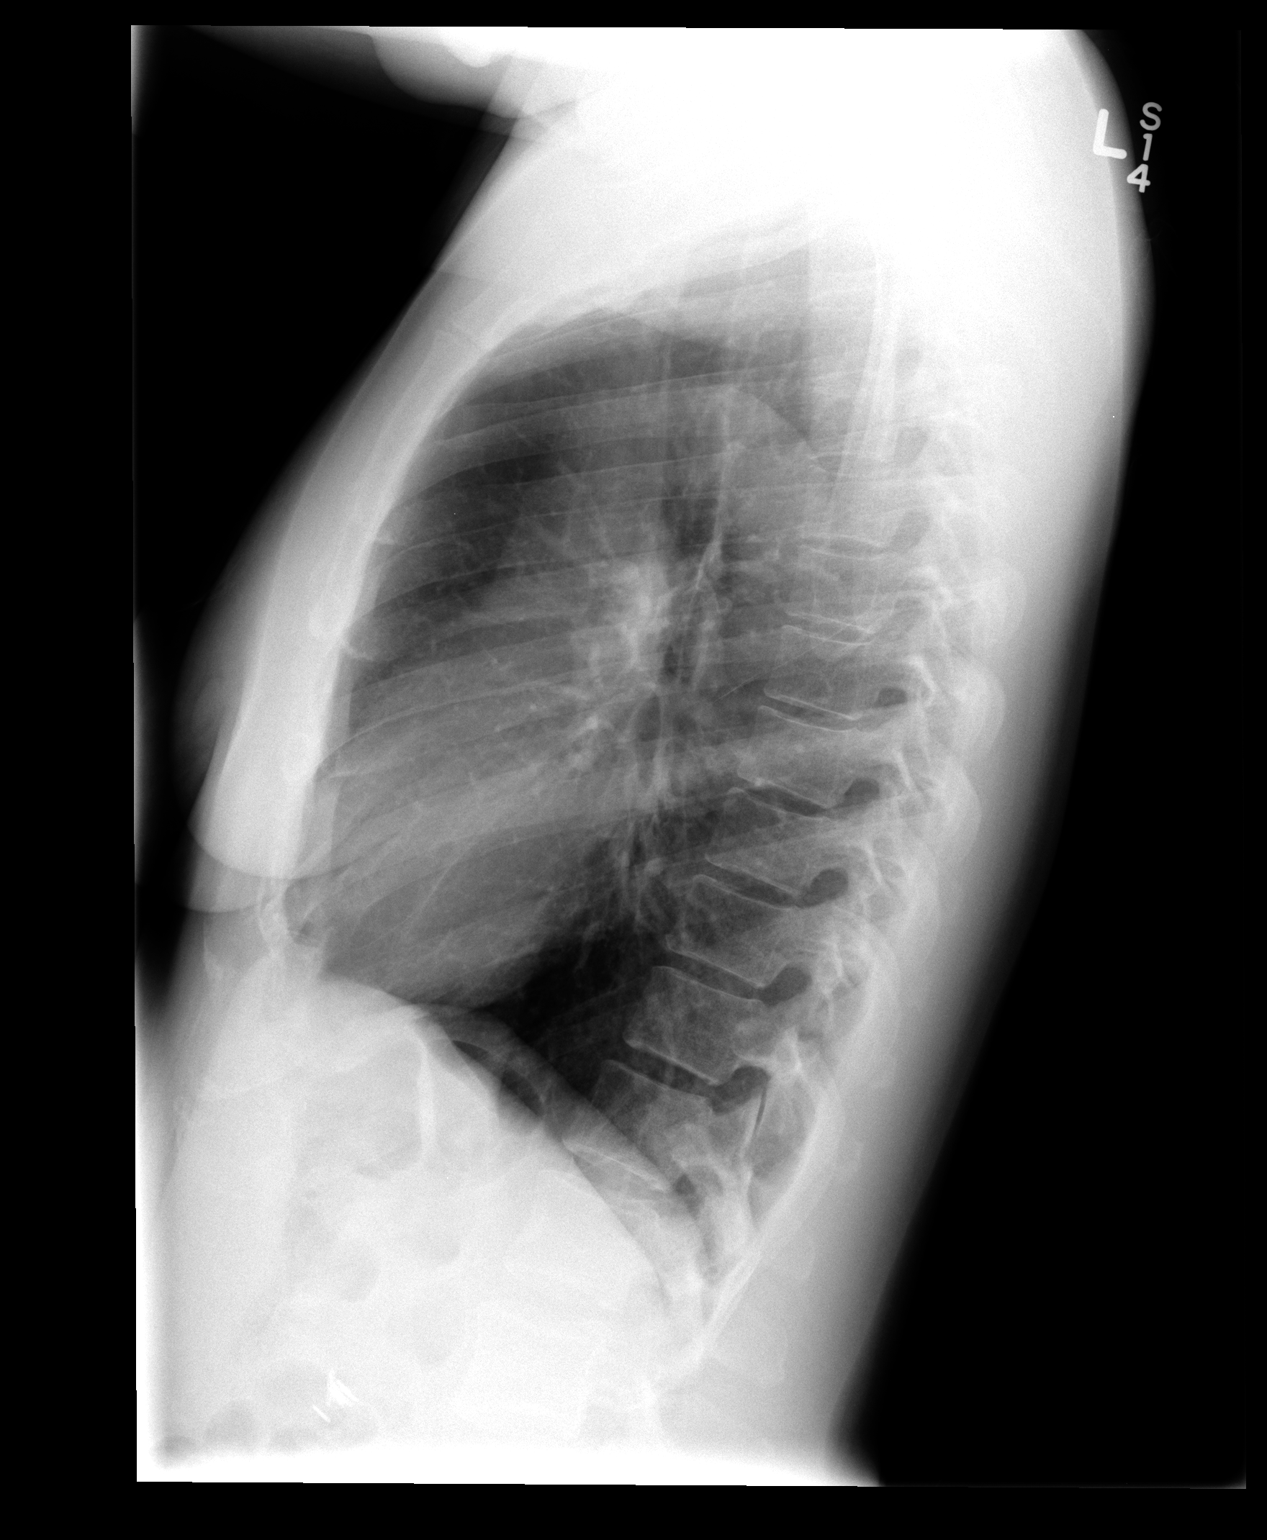

[2 of 2 positions shown; findings below may reference images not displayed]

FINDINGS: The heart size and mediastinal contours are within normal limits.
Both lungs are clear. The visualized skeletal structures are
unremarkable.
IMPRESSION: No active cardiopulmonary disease.

## 2012-11-04 MED ORDER — PROMETHAZINE-CODEINE 6.25-10 MG/5ML PO SYRP: 5.0000 mL | ORAL_SOLUTION | ORAL | Status: DC | PRN

## 2012-11-04 MED ORDER — PSEUDOEPHEDRINE HCL 60 MG PO TABS: 60.0000 mg | ORAL_TABLET | Freq: Four times a day (QID) | ORAL | Status: DC | PRN

## 2012-11-04 NOTE — ED Provider Notes (Signed)
Medical screening examination/treatment/procedure(s) were performed by non-physician practitioner and as supervising physician I was immediately available for consultation/collaboration.   Lilburn Straw R Aldon Hengst, MD 11/04/12 1538 

## 2012-11-04 NOTE — ED Notes (Signed)
Alert, NAD, sick since Tuesday with fever , cough "I think I have the flu".  Has been taking otc meds.

## 2012-11-04 NOTE — ED Notes (Signed)
Pt reports cough, congestion and fever for 2 days, right upper chest wall pain, she thinks she may have the flu.

## 2012-11-04 NOTE — ED Provider Notes (Signed)
CSN: [id number]     Arrival date & time 11/04/12  1040 History  This chart was scribed for Burgess Amor, non-physician practitioner, working with Celene Kras, MD by Bennett Scrape, ED Scribe. This patient was seen in room [facility] and the patient's care was started at 12:22 PM.   Chief Complaint  Patient presents with  . Cough  . Nasal Congestion  . Pleurisy    The history is provided by the patient. No language interpreter was used.   HPI Comments: Kimberly Rocha is a 37 y.o. female who presents to the Emergency Department complaining of 3 days of persistent, gradually worsening cough productive of a mild amount of yellow phlegm with associated hot/cold chills, fever of 101, mild otalgia, sore throat, myalgias, nasal congestion described as dark green and chest tightness. Temperature is 99 in the ED. She has a chronic smoker's cough but reports that the cough has been increased from her baseline. The chest tightness is described as spasm-like that is worse with coughing. She reports that she is taking ibuprofen at home with improvement in her symptoms. Last dose was last night around 9 or 10 Pm (more than 12 hours ago). 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 She also reports sick contacts at home with milder symptoms. She denies any sinus pain and wheezing as associated symptoms. Pt does not have a h/o chronic medical conditions. LNMP was 10/10/12.    Past Medical History  Diagnosis Date  . Gallstones    Past Surgical History  Procedure Laterality Date  . Hernia repair    . Cesarean section    . Tubal ligation    . Cholecystectomy N/A 08/22/2012    Procedure: LAPAROSCOPIC CHOLECYSTECTOMY;  Surgeon: Dalia Heading, MD;  Location: [facility];  Service: General;  Laterality: N/A;   No family history on file. History  Substance Use Topics  . Smoking status: Current Every Day Smoker -- 0.50 packs/day    Types: Cigarettes  . Smokeless tobacco: Not on file   . Alcohol Use: Yes     Comment: occ. use   No OB history provided.  Review of Systems  Constitutional: Positive for fever and chills. Negative for appetite change.  HENT: Positive for congestion, ear pain and sore throat. Negative for sinus pressure.   Respiratory: Positive for cough and chest tightness. Negative for wheezing.   Gastrointestinal: Negative for vomiting and diarrhea.  Musculoskeletal: Positive for myalgias.    Allergies  Demerol  Home Medications   Current Outpatient Rx  Name  Route  Sig  Dispense  Refill  . ibuprofen (ADVIL,MOTRIN) 200 MG tablet   Oral   Take 400 mg by mouth every 6 (six) hours as needed for pain.         . promethazine-codeine (PHENERGAN WITH CODEINE) 6.25-10 MG/5ML syrup   Oral   Take 5 mLs by mouth every 4 (four) hours as needed for cough.   120 mL   0   . pseudoephedrine (SUDAFED) 60 MG tablet   Oral   Take 1 tablet (60 mg total) by mouth every 6 (six) hours as needed for congestion.   30 tablet   0    Triage Vitals: BP 103/74  Pulse 99  Temp(Src) 99 F (37.2 C) (Oral)  Resp 20  Ht 5' (1.524 m)  Wt 122 lb (55.339 kg)  BMI 23.83 kg/m2  SpO2 99%  LMP 10/11/2012  Physical Exam  Nursing note and  vitals reviewed. Constitutional: She is oriented to person, place, and time. She appears well-developed and well-nourished.  HENT:  Head: Normocephalic and atraumatic.  Right Ear: Tympanic membrane and ear canal normal.  Left Ear: Tympanic membrane and ear canal normal.  Nose: Rhinorrhea present. No mucosal edema.  Mouth/Throat: Uvula is midline and mucous membranes are normal. Posterior oropharyngeal erythema present. No oropharyngeal exudate, posterior oropharyngeal edema or tonsillar abscesses.  Mild pharyngeal erythema.  Eyes: Conjunctivae are normal.  Cardiovascular: Normal rate, regular rhythm and normal heart sounds.   Pulmonary/Chest: Effort normal and breath sounds normal. No respiratory distress. She has no wheezes. She  has no rales.  Abdominal: Soft. There is no tenderness.  Musculoskeletal: Normal range of motion.  Neurological: She is alert and oriented to person, place, and time.  Skin: Skin is warm and dry. No rash noted.  Psychiatric: She has a normal mood and affect.    ED Course  Procedures (including critical care time)  DIAGNOSTIC STUDIES: Oxygen Saturation is 99% on room air, normal by my interpretation.    COORDINATION OF CARE: 12:26 PM-Discussed treatment plan which includes CXR with pt at bedside and pt agreed to plan.   Labs Review Labs Reviewed - No data to display Imaging Review Dg Chest 2 View  11/04/2012   CLINICAL DATA:  Cough. Congestion. Right-sided muscle spasms. Fever. Cigarette smoker.  EXAM: CHEST  2 VIEW  COMPARISON:  07/23/2012.  FINDINGS: The heart size and mediastinal contours are within normal limits. Both lungs are clear. The visualized skeletal structures are unremarkable.  IMPRESSION: No active cardiopulmonary disease.   Electronically Signed   By: Andreas Newport M.D.   On: 11/04/2012 12:50    EKG Interpretation   None       MDM   1. Acute URI     Patients labs and/or radiological studies were viewed and considered during the medical decision making and disposition process. Pt with viral uri/bronchitis sx.  xrays negative for acute bacterial process.  She was prescribed pseudoephedrine,  Phenergan/codeine cough syrup.  Encouraged rest,  Increased fluid intake,  Work note given. Advised to avoid driving within 4 hours of taking cough syrup.  Suggested smoking cessation.   I personally performed the services described in this documentation, which was scribed in my presence. The recorded information has been reviewed and is accurate.   Burgess Amor, PA-C 11/04/12 1527

## 2013-01-23 ENCOUNTER — Emergency Department (HOSPITAL_COMMUNITY)
Admission: EM | Admit: 2013-01-23 | Discharge: 2013-01-23 | Disposition: A | Discharge status: Home / Self Care | Payer: Self-pay | Attending: Emergency Medicine | Admitting: Emergency Medicine

## 2013-01-23 ENCOUNTER — Emergency Department (HOSPITAL_COMMUNITY): Payer: Self-pay

## 2013-01-23 ENCOUNTER — Encounter (HOSPITAL_COMMUNITY): Payer: Self-pay | Admitting: Emergency Medicine

## 2013-01-23 DIAGNOSIS — R0602 Shortness of breath: Secondary | ICD-10-CM | POA: Insufficient documentation

## 2013-01-23 DIAGNOSIS — R509 Fever, unspecified: Secondary | ICD-10-CM | POA: Insufficient documentation

## 2013-01-23 DIAGNOSIS — Z8719 Personal history of other diseases of the digestive system: Secondary | ICD-10-CM | POA: Insufficient documentation

## 2013-01-23 DIAGNOSIS — F17200 Nicotine dependence, unspecified, uncomplicated: Secondary | ICD-10-CM | POA: Insufficient documentation

## 2013-01-23 DIAGNOSIS — R062 Wheezing: Secondary | ICD-10-CM | POA: Insufficient documentation

## 2013-01-23 DIAGNOSIS — R0789 Other chest pain: Secondary | ICD-10-CM | POA: Insufficient documentation

## 2013-01-23 DIAGNOSIS — Z79899 Other long term (current) drug therapy: Secondary | ICD-10-CM | POA: Insufficient documentation

## 2013-01-23 DIAGNOSIS — J3489 Other specified disorders of nose and nasal sinuses: Secondary | ICD-10-CM | POA: Insufficient documentation

## 2013-01-23 DIAGNOSIS — R05 Cough: Secondary | ICD-10-CM | POA: Insufficient documentation

## 2013-01-23 DIAGNOSIS — Z792 Long term (current) use of antibiotics: Secondary | ICD-10-CM | POA: Insufficient documentation

## 2013-01-23 DIAGNOSIS — R059 Cough, unspecified: Secondary | ICD-10-CM | POA: Insufficient documentation

## 2013-01-23 IMAGING — CR DG CHEST 2V
2 series · 2 of 2 positions shown · non-contrast
Comparison: [DATE]

CLINICAL DATA: Cough, smoker

EXAM:
CHEST  2 VIEW

[view not recorded (1 of 2)]
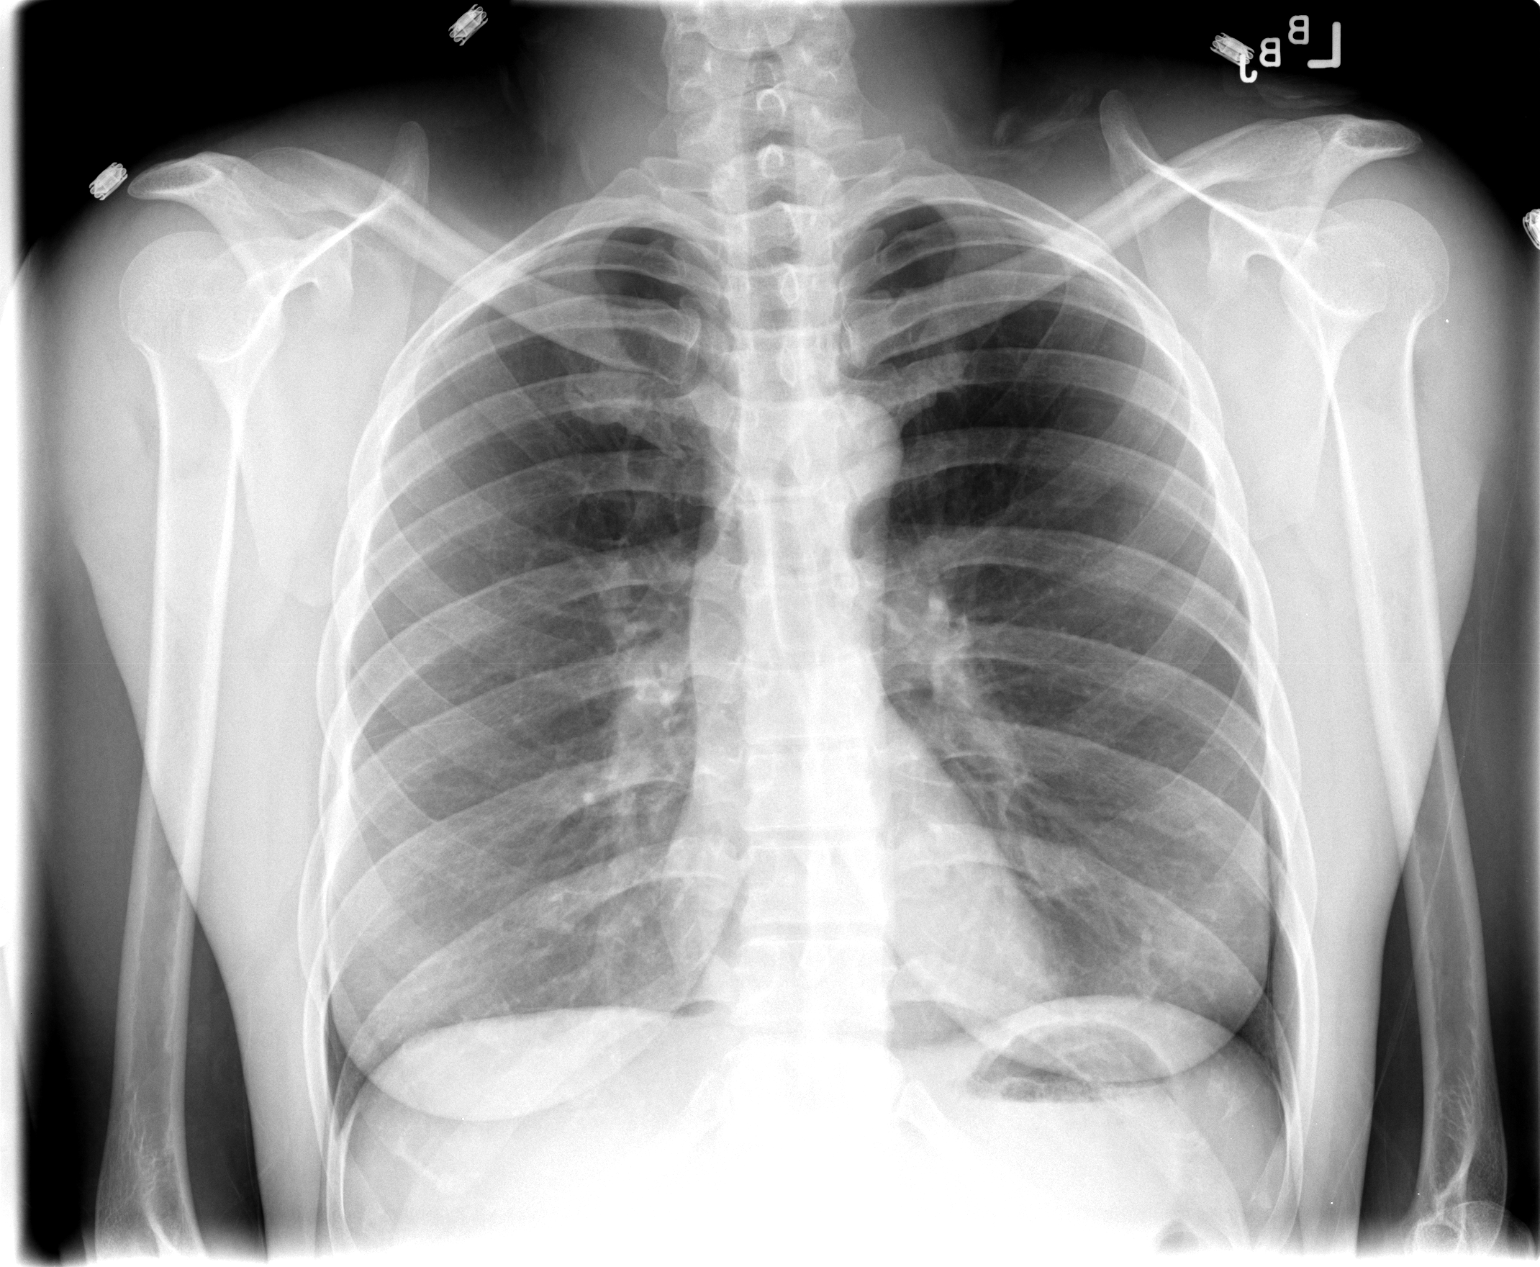

[view not recorded (2 of 2)]
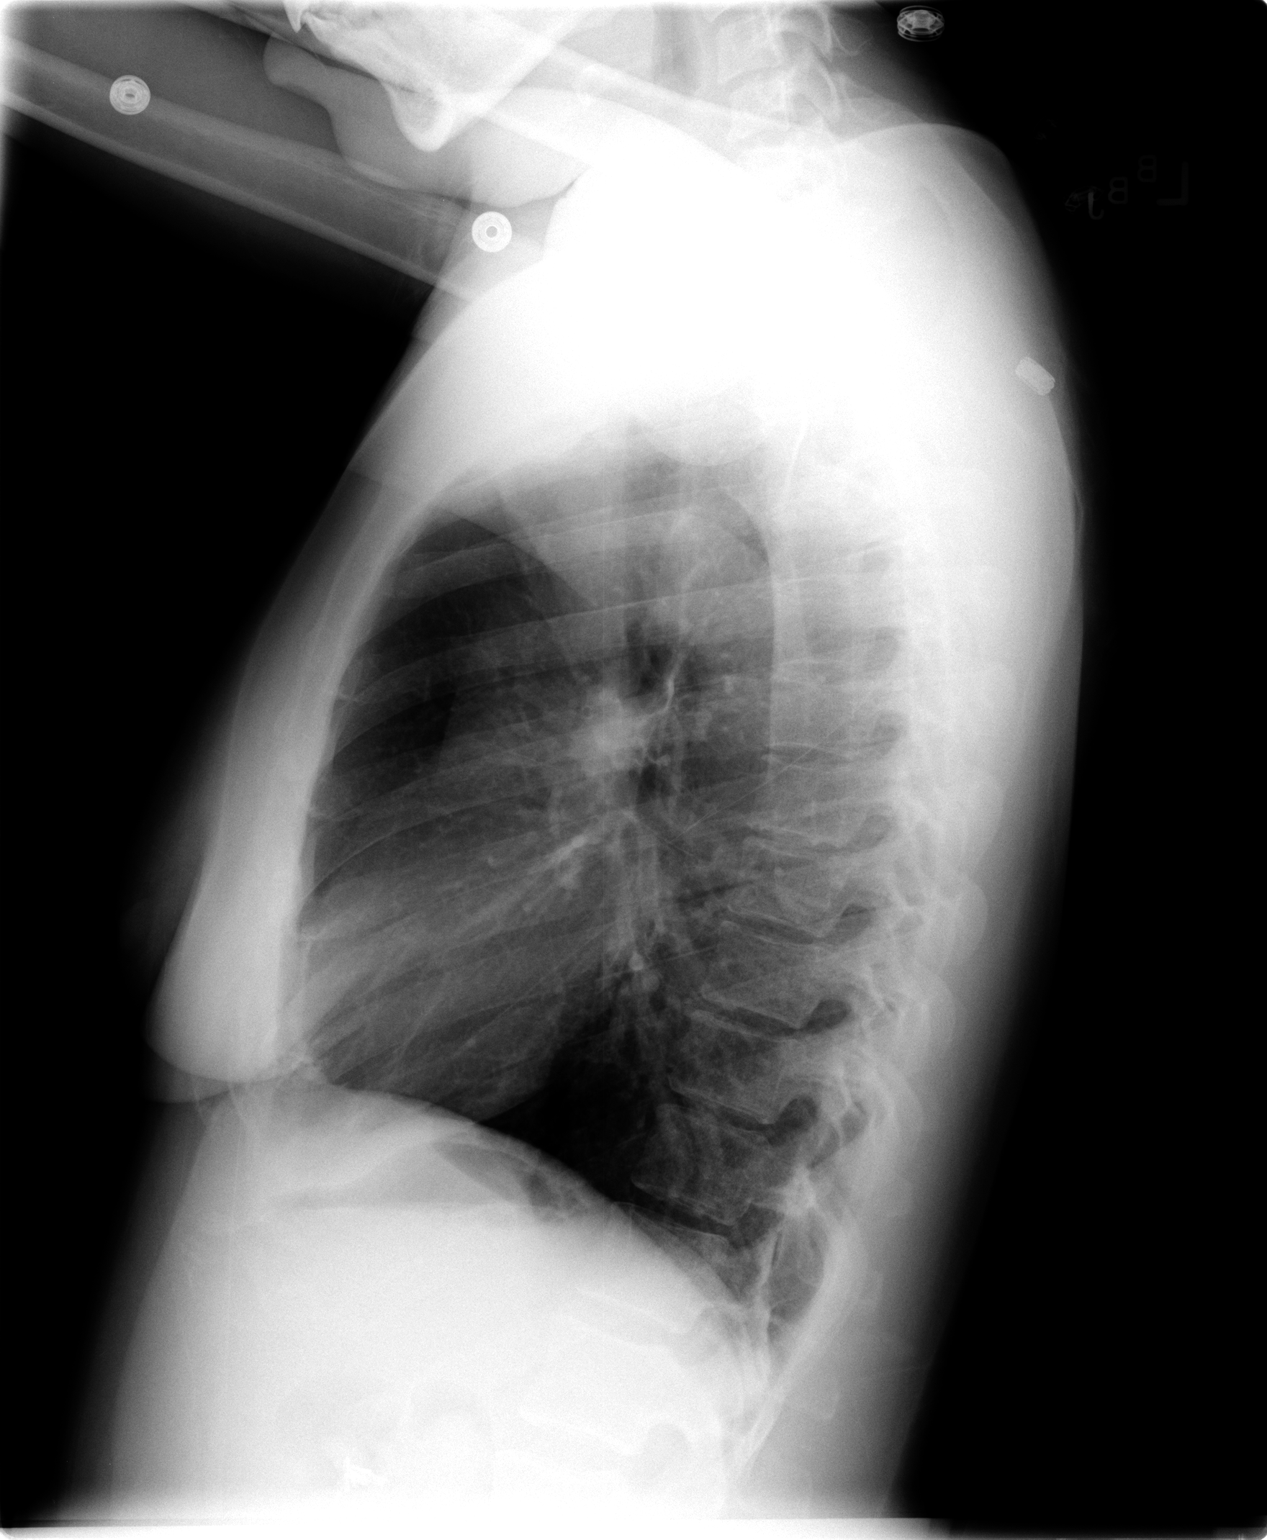

[2 of 2 positions shown; findings below may reference images not displayed]

FINDINGS: The heart size and mediastinal contours are within normal limits.
Both lungs are clear. The visualized skeletal structures are
unremarkable.
IMPRESSION: No active cardiopulmonary disease.

## 2013-01-23 MED ORDER — GUAIFENESIN-CODEINE 100-10 MG/5ML PO SYRP: 10.0000 mL | ORAL_SOLUTION | Freq: Three times a day (TID) | ORAL | Status: DC | PRN

## 2013-01-23 MED ORDER — ALBUTEROL SULFATE HFA 108 (90 BASE) MCG/ACT IN AERS
2.0000 | INHALATION_SPRAY | Freq: Once | RESPIRATORY_TRACT | Status: AC
  Administered 2013-01-23: 2 via RESPIRATORY_TRACT
  Filled 2013-01-23: qty 6.7

## 2013-01-23 MED ORDER — AZITHROMYCIN 250 MG PO TABS: ORAL_TABLET | ORAL | Status: DC

## 2013-01-23 MED ORDER — AZITHROMYCIN 250 MG PO TABS
500.0000 mg | ORAL_TABLET | Freq: Once | ORAL | Status: AC
  Administered 2013-01-23: 500 mg via ORAL
  Filled 2013-01-23: qty 2

## 2013-01-23 NOTE — ED Notes (Signed)
Sick with cough, sob since 12/25,  Green -yellow sputum.  Had fever earlier.    N/V yesterday

## 2013-01-24 NOTE — ED Provider Notes (Signed)
CSN: [id number]     Arrival date & time 01/23/13  2021 History   First MD Initiated Contact with Patient 01/23/13 2153     Chief Complaint  Patient presents with  . Cough   (Consider location/radiation/quality/duration/timing/severity/associated sxs/prior Treatment) Patient is a 37 y.o. female presenting with cough. The history is provided by the patient.  Cough Cough characteristics:  Productive Sputum characteristics:  Green and yellow Severity:  Moderate Onset quality:  Gradual Duration:  1 week Timing:  Constant Progression:  Unchanged Chronicity:  New Smoker: yes   Context: sick contacts   Relieved by:  Nothing Worsened by:  Nothing tried Ineffective treatments:  Decongestant Associated symptoms: chills, fever, rhinorrhea and shortness of breath   Associated symptoms: no chest pain, no headaches, no myalgias, no rash, no sore throat and no wheezing   Shortness of breath:    Severity:  Mild   Onset quality:  Gradual   Timing:  Intermittent   Progression:  Waxing and waning Risk factors: no recent travel     Past Medical History  Diagnosis Date  . Gallstones    Past Surgical History  Procedure Laterality Date  . Hernia repair    . Cesarean section    . Tubal ligation    . Cholecystectomy N/A 08/22/2012    Procedure: LAPAROSCOPIC CHOLECYSTECTOMY;  Surgeon: Dalia Heading, MD;  Location: [facility];  Service: General;  Laterality: N/A;   History reviewed. No pertinent family history. History  Substance Use Topics  . Smoking status: Current Every Day Smoker -- 0.50 packs/day    Types: Cigarettes  . Smokeless tobacco: Not on file  . Alcohol Use: Yes     Comment: occ. use   OB History   Grav Para Term Preterm Abortions TAB SAB Ect Mult Living                 Review of Systems  Constitutional: Positive for fever and chills. Negative for activity change and appetite change.  HENT: Positive for congestion and rhinorrhea. Negative for facial swelling, sore throat  and trouble swallowing.   Eyes: Negative for visual disturbance.  Respiratory: Positive for cough, chest tightness and shortness of breath. Negative for wheezing and stridor.   Cardiovascular: Negative for chest pain.  Gastrointestinal: Negative for nausea and vomiting.  Musculoskeletal: Negative for arthralgias, myalgias, neck pain and neck stiffness.  Skin: Negative.  Negative for rash.  Neurological: Negative for dizziness, weakness, numbness and headaches.  Hematological: Negative for adenopathy.  Psychiatric/Behavioral: Negative for confusion.  All other systems reviewed and are negative.    Allergies  Demerol  Home Medications   Current Outpatient Rx  Name  Route  Sig  Dispense  Refill  . dextromethorphan-guaiFENesin (MUCINEX DM) 30-600 MG per 12 hr tablet   Oral   Take 1 tablet by mouth 2 (two) times daily.         Marland Kitchen ibuprofen (ADVIL,MOTRIN) 200 MG tablet   Oral   Take 400 mg by mouth every 6 (six) hours as needed for pain.         . phenylephrine (SUDAFED PE) 10 MG TABS tablet   Oral   Take 10 mg by mouth every 4 (four) hours as needed.         Marland Kitchen azithromycin (ZITHROMAX Z-PAK) 250 MG tablet      Take two tablets on day one, then one tab qd days 2-5   6 tablet   0   . guaiFENesin-codeine (ROBITUSSIN AC) 100-10 MG/5ML syrup  Oral   Take 10 mLs by mouth 3 (three) times daily as needed for cough.   120 mL   0    BP 109/67  Pulse 105  Temp(Src) 97.9 F (36.6 C) (Oral)  Resp 20  Ht 5' (1.524 m)  Wt 117 lb (53.071 kg)  BMI 22.85 kg/m2  SpO2 100%  LMP 12/31/2012 Physical Exam  Nursing note and vitals reviewed. Constitutional: She is oriented to person, place, and time. She appears well-developed and well-nourished. No distress.  HENT:  Head: Normocephalic and atraumatic.  Right Ear: Tympanic membrane and ear canal normal.  Left Ear: Tympanic membrane and ear canal normal.  Mouth/Throat: Uvula is midline, oropharynx is clear and moist and mucous  membranes are normal. No oropharyngeal exudate.  Eyes: EOM are normal. Pupils are equal, round, and reactive to light.  Neck: Normal range of motion, full passive range of motion without pain and phonation normal. Neck supple.  Cardiovascular: Normal rate, regular rhythm, normal heart sounds and intact distal pulses.   No murmur heard. Pulmonary/Chest: Effort normal. No stridor. No respiratory distress. She has wheezes. She has no rales. She exhibits no tenderness.  Coarse lungs sounds bilaterally.  Few scattered expiratory wheezes throughout  Abdominal: Soft. She exhibits no distension. There is no tenderness.  Musculoskeletal: She exhibits no edema.  Lymphadenopathy:    She has no cervical adenopathy.  Neurological: She is alert and oriented to person, place, and time. She exhibits normal muscle tone. Coordination normal.  Skin: Skin is warm and dry. No rash noted.    ED Course  Procedures (including critical care time) Labs Review Labs Reviewed - No data to display Imaging Review Dg Chest 2 View  01/23/2013   CLINICAL DATA:  Cough, smoker  EXAM: CHEST  2 VIEW  COMPARISON:  11/04/2012  FINDINGS: The heart size and mediastinal contours are within normal limits. Both lungs are clear. The visualized skeletal structures are unremarkable.  IMPRESSION: No active cardiopulmonary disease.   Electronically Signed   By: Elige Ko   On: 01/23/2013 22:05    EKG Interpretation   None       MDM   1. Cough      VSS.  Patient is well appearing.  Cough and congestion with recent sick contacts.  Dyspnea is intermittent and associated with excessive coughing.  Clinical suspicion for PE is low.  Pt agrees to albuterol inhaler, zithromax and robitussin AC.  She also agrees to close PMD f/u or to return here if needed.  Patient appears stable for discharge    Gustavo Dispenza L. Trisha Mangle, PA-C 01/25/13 1332

## 2013-01-28 NOTE — ED Provider Notes (Signed)
  Medical screening examination/treatment/procedure(s) were performed by non-physician practitioner and as supervising physician I was immediately available for consultation/collaboration.  EKG Interpretation   None          Jayden Kratochvil, MD 01/28/13 0057 

## 2013-03-21 ENCOUNTER — Emergency Department (HOSPITAL_COMMUNITY): Payer: Medicaid Other

## 2013-03-21 ENCOUNTER — Encounter (HOSPITAL_COMMUNITY): Payer: Self-pay | Admitting: Emergency Medicine

## 2013-03-21 ENCOUNTER — Emergency Department (HOSPITAL_COMMUNITY)
Admission: EM | Admit: 2013-03-21 | Discharge: 2013-03-21 | Disposition: A | Discharge status: Home / Self Care | Payer: Medicaid Other | Attending: Emergency Medicine | Admitting: Emergency Medicine

## 2013-03-21 DIAGNOSIS — Z8719 Personal history of other diseases of the digestive system: Secondary | ICD-10-CM | POA: Insufficient documentation

## 2013-03-21 DIAGNOSIS — F17200 Nicotine dependence, unspecified, uncomplicated: Secondary | ICD-10-CM | POA: Insufficient documentation

## 2013-03-21 DIAGNOSIS — H748X9 Other specified disorders of middle ear and mastoid, unspecified ear: Secondary | ICD-10-CM | POA: Insufficient documentation

## 2013-03-21 DIAGNOSIS — R52 Pain, unspecified: Secondary | ICD-10-CM | POA: Insufficient documentation

## 2013-03-21 DIAGNOSIS — Z79899 Other long term (current) drug therapy: Secondary | ICD-10-CM | POA: Insufficient documentation

## 2013-03-21 DIAGNOSIS — J069 Acute upper respiratory infection, unspecified: Secondary | ICD-10-CM | POA: Insufficient documentation

## 2013-03-21 IMAGING — CR DG CHEST 2V
2 series · 2 of 2 positions shown · non-contrast
Comparison: None.

CLINICAL DATA: Productive cough.

EXAM:
CHEST  2 VIEW

[view not recorded (1 of 2)]
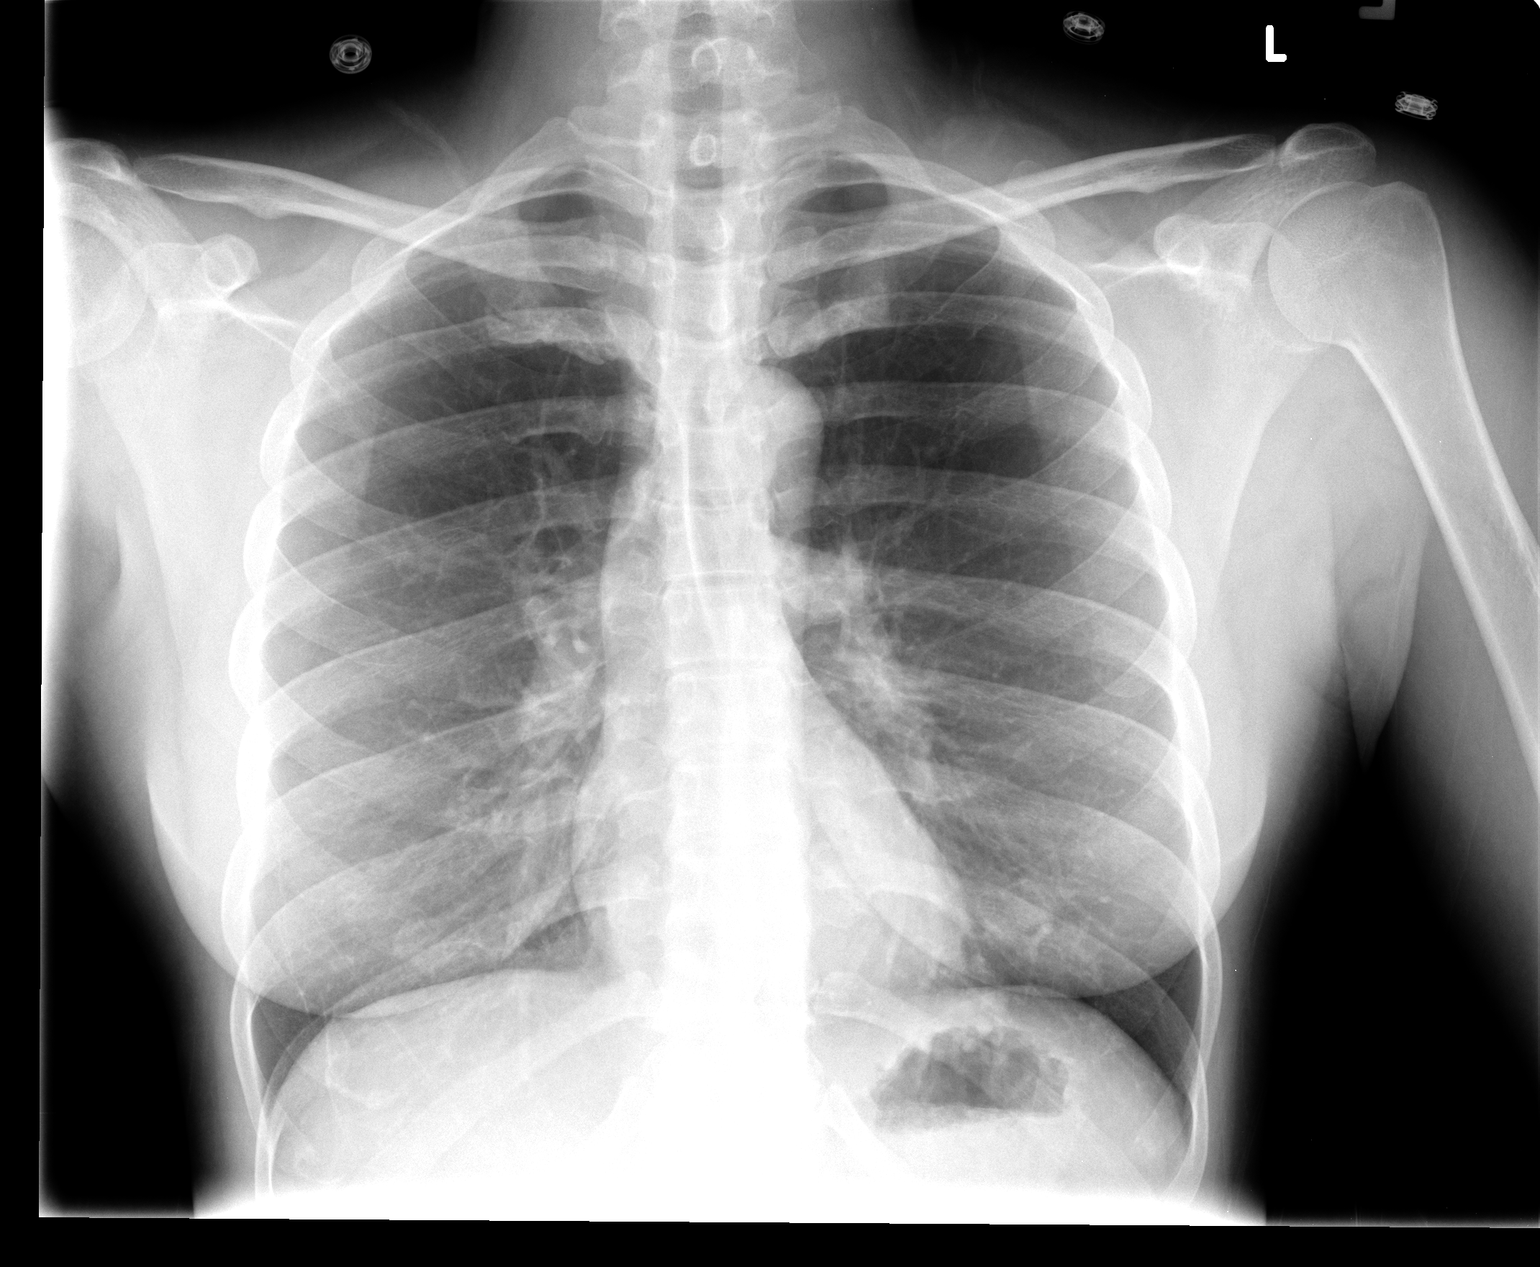

[view not recorded (2 of 2)]
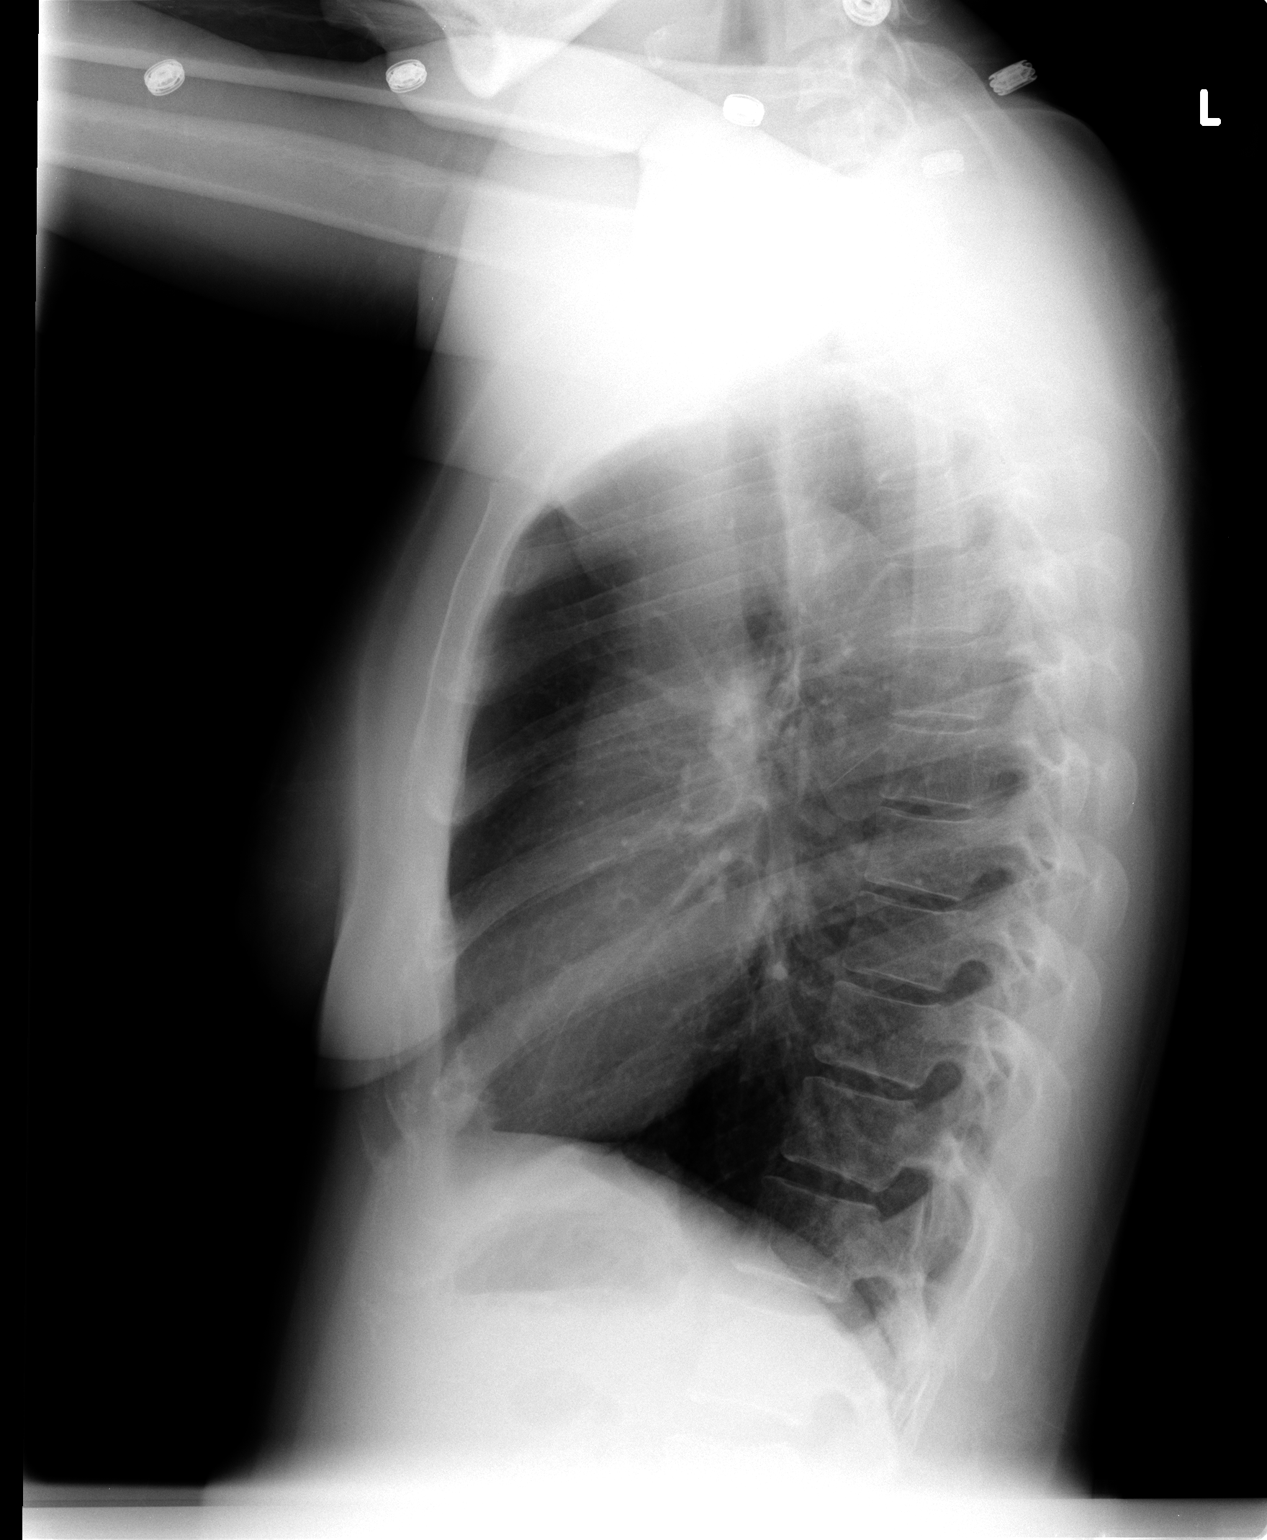

[2 of 2 positions shown; findings below may reference images not displayed]

FINDINGS: Mediastinum and hilar structures normal. Lungs are clear. Heart size
normal.
IMPRESSION: No acute cardiopulmonary disease.  Chest is stable from prior exam.

## 2013-03-21 MED ORDER — BENZONATATE 200 MG PO CAPS: 200.0000 mg | ORAL_CAPSULE | Freq: Three times a day (TID) | ORAL | Status: DC | PRN

## 2013-03-21 NOTE — ED Notes (Signed)
C/o productive cough of yellow phlegm x 2 days, body aches and fever

## 2013-03-21 NOTE — ED Provider Notes (Signed)
CSN: [id number]     Arrival date & time 03/21/13  1607 History   First MD Initiated Contact with Patient 03/21/13 1618     Chief Complaint  Patient presents with  . Cough     (Consider location/radiation/quality/duration/timing/severity/associated sxs/prior Treatment) HPI Comments: Kimberly Rocha is a 38 y.o. Female presenting with a  2day history of uri type symptoms which includes nasal congestion with clear rhinorrhea, sore throat, fever to 101, cough with yellow sputum and generalized body aches.  She also describes bilateral ear pressure without pain or drainage.  Symptoms due to not include shortness of breath, chest pain,  Nausea, vomiting or diarrhea.  The patient has taken mucinex DM prior to arrival with no significant improvement in symptoms.      The history is provided by the patient.    Past Medical History  Diagnosis Date  . Gallstones    Past Surgical History  Procedure Laterality Date  . Hernia repair    . Cesarean section    . Tubal ligation    . Cholecystectomy N/A 08/22/2012    Procedure: LAPAROSCOPIC CHOLECYSTECTOMY;  Surgeon: Dalia HeadingMark A Jenkins, MD;  Location: [facility];  Service: General;  Laterality: N/A;   History reviewed. No pertinent family history. History  Substance Use Topics  . Smoking status: Current Every Day Smoker -- 0.50 packs/day    Types: Cigarettes  . Smokeless tobacco: Not on file  . Alcohol Use: Yes     Comment: occ. use   OB History   Grav Para Term Preterm Abortions TAB SAB Ect Mult Living                 Review of Systems  Constitutional: Positive for fever. Negative for chills.  HENT: Positive for congestion, rhinorrhea and sore throat. Negative for ear discharge, ear pain, hearing loss, sinus pressure, trouble swallowing and voice change.   Eyes: Negative for discharge.  Respiratory: Positive for cough. Negative for chest tightness, shortness of breath, wheezing and stridor.   Cardiovascular: Negative for chest pain.   Gastrointestinal: Negative for nausea, vomiting and abdominal pain.  Genitourinary: Negative.       Allergies  Demerol  Home Medications   Current Outpatient Rx  Name  Route  Sig  Dispense  Refill  . dextromethorphan-guaiFENesin (MUCINEX DM) 30-600 MG per 12 hr tablet   Oral   Take 1 tablet by mouth 2 (two) times daily.         Marland Kitchen. DM-Doxylamine-Acetaminophen (NYQUIL COLD & FLU PO)   Oral   Take 2 capsules by mouth every 4 (four) hours as needed. Cold/flu sx         . ibuprofen (ADVIL,MOTRIN) 200 MG tablet   Oral   Take 400 mg by mouth every 6 (six) hours as needed for pain.         . benzonatate (TESSALON) 200 MG capsule   Oral   Take 1 capsule (200 mg total) by mouth 3 (three) times daily as needed for cough.   30 capsule   0    BP 117/77  Pulse 107  Temp(Src) 98.3 F (36.8 C) (Oral)  Resp 18  Ht 5\' 1"  (1.549 m)  Wt 132 lb (59.875 kg)  BMI 24.95 kg/m2  SpO2 100%  LMP 03/20/2013 Physical Exam  Constitutional: She is oriented to person, place, and time. She appears well-developed and well-nourished.  HENT:  Head: Normocephalic and atraumatic.  Right Ear: Tympanic membrane and ear canal normal.  Left Ear: Tympanic membrane  and ear canal normal.  Nose: Mucosal edema and rhinorrhea present.  Mouth/Throat: Uvula is midline, oropharynx is clear and moist and mucous membranes are normal. No oropharyngeal exudate, posterior oropharyngeal edema, posterior oropharyngeal erythema or tonsillar abscesses.  Eyes: Conjunctivae are normal.  Cardiovascular: Normal rate and normal heart sounds.   Pulmonary/Chest: Effort normal. No respiratory distress. She has no decreased breath sounds. She has no wheezes. She has no rhonchi. She has no rales.  Abdominal: Soft. There is no tenderness.  Musculoskeletal: Normal range of motion.  Neurological: She is alert and oriented to person, place, and time.  Skin: Skin is warm and dry. No rash noted.  Psychiatric: She has a normal  mood and affect.    ED Course  Procedures (including critical care time) Labs Review Labs Reviewed - No data to display Imaging Review Dg Chest 2 View  03/21/2013   CLINICAL DATA:  Productive cough.  EXAM: CHEST  2 VIEW  COMPARISON:  None.  FINDINGS: Mediastinum and hilar structures normal. Lungs are clear. Heart size normal.  IMPRESSION: No acute cardiopulmonary disease.  Chest is stable from prior exam.   Electronically Signed   By: Maisie Fus  Register   On: 03/21/2013 16:50    EKG Interpretation   None       MDM   Final diagnoses:  Acute URI    Patients labs and/or radiological studies were viewed and considered during the medical decision making and disposition process. Pt prescribed tessalon, encouraged rest, increased fluid intake.  Prn f/u   The patient appears reasonably screened and/or stabilized for discharge and I doubt any other medical condition or other [facility] requiring further screening, evaluation, or treatment in the ED at this time prior to discharge.     Burgess Amor, PA-C 03/21/13 1733

## 2013-03-21 NOTE — Discharge Instructions (Signed)

## 2013-03-21 NOTE — ED Provider Notes (Signed)
Medical screening examination/treatment/procedure(s) were performed by non-physician practitioner and as supervising physician I was immediately available for consultation/collaboration.  EKG Interpretation   None         Zyaire Dumas, MD 03/21/13 2146 

## 2013-03-21 NOTE — ED Notes (Addendum)
Fever, cough, congestion, body aches  Yellow sputum

## 2014-02-15 ENCOUNTER — Emergency Department (HOSPITAL_COMMUNITY)
Admission: EM | Admit: 2014-02-15 | Discharge: 2014-02-15 | Disposition: A | Discharge status: Home / Self Care | Payer: Medicaid Other | Attending: Emergency Medicine | Admitting: Emergency Medicine

## 2014-02-15 ENCOUNTER — Encounter (HOSPITAL_COMMUNITY): Payer: Self-pay

## 2014-02-15 DIAGNOSIS — Z79899 Other long term (current) drug therapy: Secondary | ICD-10-CM | POA: Insufficient documentation

## 2014-02-15 DIAGNOSIS — S299XXA Unspecified injury of thorax, initial encounter: Secondary | ICD-10-CM | POA: Insufficient documentation

## 2014-02-15 DIAGNOSIS — Y998 Other external cause status: Secondary | ICD-10-CM | POA: Insufficient documentation

## 2014-02-15 DIAGNOSIS — Z8719 Personal history of other diseases of the digestive system: Secondary | ICD-10-CM | POA: Insufficient documentation

## 2014-02-15 DIAGNOSIS — Y92410 Unspecified street and highway as the place of occurrence of the external cause: Secondary | ICD-10-CM | POA: Insufficient documentation

## 2014-02-15 DIAGNOSIS — S46912A Strain of unspecified muscle, fascia and tendon at shoulder and upper arm level, left arm, initial encounter: Secondary | ICD-10-CM | POA: Insufficient documentation

## 2014-02-15 DIAGNOSIS — T148XXA Other injury of unspecified body region, initial encounter: Secondary | ICD-10-CM

## 2014-02-15 DIAGNOSIS — S39012A Strain of muscle, fascia and tendon of lower back, initial encounter: Secondary | ICD-10-CM | POA: Insufficient documentation

## 2014-02-15 DIAGNOSIS — Y9389 Activity, other specified: Secondary | ICD-10-CM | POA: Insufficient documentation

## 2014-02-15 DIAGNOSIS — Z720 Tobacco use: Secondary | ICD-10-CM | POA: Insufficient documentation

## 2014-02-15 MED ORDER — HYDROCODONE-ACETAMINOPHEN 5-325 MG PO TABS: 1.0000 | ORAL_TABLET | ORAL | Status: DC | PRN

## 2014-02-15 MED ORDER — METHOCARBAMOL 500 MG PO TABS: 500.0000 mg | ORAL_TABLET | Freq: Three times a day (TID) | ORAL | Status: DC

## 2014-02-15 MED ORDER — CELECOXIB 100 MG PO CAPS: 100.0000 mg | ORAL_CAPSULE | Freq: Two times a day (BID) | ORAL | Status: DC

## 2014-02-15 MED ORDER — IBUPROFEN 800 MG PO TABS
800.0000 mg | ORAL_TABLET | Freq: Once | ORAL | Status: AC
  Administered 2014-02-15: 800 mg via ORAL
  Filled 2014-02-15: qty 1

## 2014-02-15 MED ORDER — HYDROCODONE-ACETAMINOPHEN 5-325 MG PO TABS
2.0000 | ORAL_TABLET | Freq: Once | ORAL | Status: AC
  Administered 2014-02-15: 2 via ORAL
  Filled 2014-02-15: qty 2

## 2014-02-15 MED ORDER — METHOCARBAMOL 500 MG PO TABS
500.0000 mg | ORAL_TABLET | Freq: Once | ORAL | Status: AC
  Administered 2014-02-15: 500 mg via ORAL
  Filled 2014-02-15: qty 1
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
## 2015-02-14 ENCOUNTER — Emergency Department (HOSPITAL_COMMUNITY): Payer: Medicaid Other

## 2015-02-14 ENCOUNTER — Encounter (HOSPITAL_COMMUNITY): Payer: Self-pay

## 2015-02-14 ENCOUNTER — Emergency Department (HOSPITAL_COMMUNITY)
Admission: EM | Admit: 2015-02-14 | Discharge: 2015-02-15 | Disposition: A | Discharge status: Home / Self Care | Payer: Medicaid Other | Attending: Emergency Medicine | Admitting: Emergency Medicine

## 2015-02-14 DIAGNOSIS — Z9049 Acquired absence of other specified parts of digestive tract: Secondary | ICD-10-CM | POA: Insufficient documentation

## 2015-02-14 DIAGNOSIS — M549 Dorsalgia, unspecified: Secondary | ICD-10-CM | POA: Insufficient documentation

## 2015-02-14 DIAGNOSIS — K219 Gastro-esophageal reflux disease without esophagitis: Secondary | ICD-10-CM | POA: Insufficient documentation

## 2015-02-14 DIAGNOSIS — Z79899 Other long term (current) drug therapy: Secondary | ICD-10-CM | POA: Insufficient documentation

## 2015-02-14 DIAGNOSIS — Z9851 Tubal ligation status: Secondary | ICD-10-CM | POA: Insufficient documentation

## 2015-02-14 DIAGNOSIS — F17210 Nicotine dependence, cigarettes, uncomplicated: Secondary | ICD-10-CM | POA: Insufficient documentation

## 2015-02-14 DIAGNOSIS — Z9889 Other specified postprocedural states: Secondary | ICD-10-CM | POA: Insufficient documentation

## 2015-02-14 DIAGNOSIS — Z791 Long term (current) use of non-steroidal anti-inflammatories (NSAID): Secondary | ICD-10-CM | POA: Insufficient documentation

## 2015-02-14 MED ORDER — GI COCKTAIL ~~LOC~~
30.0000 mL | Freq: Once | ORAL | Status: AC
  Administered 2015-02-14: 30 mL via ORAL
  Filled 2015-02-14: qty 30

## 2015-02-14 MED ORDER — PANTOPRAZOLE SODIUM 40 MG PO TBEC
40.0000 mg | DELAYED_RELEASE_TABLET | Freq: Every day | ORAL | Status: DC
  Administered 2015-02-14: 40 mg via ORAL
  Filled 2015-02-14: qty 1

## 2015-02-14 NOTE — ED Notes (Signed)
Pt reporting epigastric CP and "burning feeling in my throat".  Pt states she also has "a funny feeling" in right arm.  Denies nausea or vomiting.  VS stable.

## 2015-02-14 NOTE — ED Provider Notes (Signed)
CSN: [id number]     Arrival date & time 02/14/15  2241 History  By signing my name below, I, Arlan Organ, attest that this documentation has been prepared under the direction and in the presence of Shon Baton, MD.  Electronically Signed: Arlan Organ, ED Scribe. 02/14/2015. 11:43 PM.   Chief Complaint  Patient presents with  . Chest Pain   The history is provided by the patient. No language interpreter was used.     HPI Comments: LAYNI KREAMER is a 40 y.o. female with PMHx of gallstones who presents to the Emergency Department complaining of intermittent chest pain that radiates to back, onset 2 hours ago. Pt rates the pain an 8/10 and describes it as sharp. She also reports an associated burning sensation into throat. Pain is worsened when is lying flat, with no alleviating factors. Pt denies that eating worsens symptoms. Pt attempted OTC Tums at home with no improvement. Pt denies fever, chills, cough, nausea, vomiting. No leg swelling or prior history of blood clots. She admits to prior history of same as a result pt underwent a cholecystectomy on 08/22/2012. Denies DM, HLD, HTN. Pt admits to regular smoking and occasional alcohol consumption.    Past Medical History  Diagnosis Date  . Gallstones    Past Surgical History  Procedure Laterality Date  . Hernia repair    . Cesarean section    . Tubal ligation    . Cholecystectomy N/A 08/22/2012    Procedure: LAPAROSCOPIC CHOLECYSTECTOMY;  Surgeon: Dalia Heading, MD;  Location: [facility];  Service: General;  Laterality: N/A;   History reviewed. No pertinent family history. Social History  Substance Use Topics  . Smoking status: Current Every Day Smoker -- 0.50 packs/day    Types: Cigarettes  . Smokeless tobacco: None  . Alcohol Use: Yes     Comment: occ. use   OB History    No data available     Review of Systems  Constitutional: Negative for fever and chills.  Respiratory: Negative for cough and shortness of breath.    Cardiovascular: Positive for chest pain.  Gastrointestinal: Negative for nausea and vomiting.  Musculoskeletal: Positive for back pain.  Neurological: Negative for headaches.  Psychiatric/Behavioral: Negative for confusion.  All other systems reviewed and are negative.     Allergies  Demerol  Home Medications   Prior to Admission medications   Medication Sig Start Date End Date Taking? Authorizing Provider  benzonatate (TESSALON) 200 MG capsule Take 1 capsule (200 mg total) by mouth 3 (three) times daily as needed for cough. 03/21/13   Burgess Amor, PA-C  celecoxib (CELEBREX) 100 MG capsule Take 1 capsule (100 mg total) by mouth 2 (two) times daily. 02/15/14   Ivery Quale, PA-C  dextromethorphan-guaiFENesin (MUCINEX DM) 30-600 MG per 12 hr tablet Take 1 tablet by mouth 2 (two) times daily.    Historical Provider, MD  DM-Doxylamine-Acetaminophen (NYQUIL COLD & FLU PO) Take 2 capsules by mouth every 4 (four) hours as needed. Cold/flu sx    Historical Provider, MD  HYDROcodone-acetaminophen (NORCO/VICODIN) 5-325 MG per tablet Take 1 tablet by mouth every 4 (four) hours as needed. 02/15/14   Ivery Quale, PA-C  ibuprofen (ADVIL,MOTRIN) 200 MG tablet Take 400 mg by mouth every 6 (six) hours as needed for pain.    Historical Provider, MD  methocarbamol (ROBAXIN) 500 MG tablet Take 1 tablet (500 mg total) by mouth 3 (three) times daily. 02/15/14   Ivery Quale, PA-C  omeprazole (PRILOSEC) 20 MG  capsule Take 1 capsule (20 mg total) by mouth daily. 02/15/15   Shon Baton, MD   BP 115/92 mmHg  Pulse 90  Resp 16  SpO2 99%  LMP 02/01/2015 Physical Exam  Constitutional: She is oriented to person, place, and time. She appears well-developed and well-nourished. No distress.  HENT:  Head: Normocephalic and atraumatic.  Cardiovascular: Normal rate, regular rhythm and normal heart sounds.   Pulmonary/Chest: Effort normal and breath sounds normal. No respiratory distress. She has no wheezes.   Abdominal: Soft. Bowel sounds are normal. There is tenderness. There is no rebound and no guarding.  Mild epigastric tenderness to palpation without rebound or guarding  Neurological: She is alert and oriented to person, place, and time.  Skin: Skin is warm and dry.  Psychiatric: She has a normal mood and affect.  Nursing note and vitals reviewed.   ED Course  Procedures  DIAGNOSTIC STUDIES: Oxygen Saturation is 100% on RA, normal by my interpretation.    COORDINATION OF CARE:  11:35 PM-Discussed treatment plan  with pt at bedside and pt agreed to plan.    Labs Review Labs Reviewed  TROPONIN I    Imaging Review Dg Chest 2 View  02/15/2015  CLINICAL DATA:  Acute onset of sharp heavy chest pain, radiating to the upper back. Initial encounter. EXAM: CHEST  2 VIEW COMPARISON:  Chest radiograph performed 03/21/2013 FINDINGS: The lungs are well-aerated and clear. There is no evidence of focal opacification, pleural effusion or pneumothorax. The heart is normal in size; the mediastinal contour is within normal limits. No acute osseous abnormalities are seen. IMPRESSION: No acute cardiopulmonary process seen. Electronically Signed   By: Roanna Raider M.D.   On: 02/15/2015 00:25   I have personally reviewed and evaluated these images and lab results as part of my medical decision-making.   EKG Interpretation   Date/Time:  Thursday February 14 2015 22:50:47 EST Ventricular Rate:  90 PR Interval:  155 QRS Duration: 78 QT Interval:  331 QTC Calculation: 405 R Axis:   81 Text Interpretation:  Sinus rhythm Baseline wander in lead(s) V6 Confirmed  by Idil Maslanka  MD, Hampton Cost ([id number]) on 02/14/2015 11:44:57 PM      MDM   Final diagnoses:  Gastroesophageal reflux disease, esophagitis presence not specified   Patient presents with chest pain. Associated with lying flat and burning in her throat. History and physical exam most consistent with reflux. She has relatively low risk for heart  disease with her only risk factor smoking. EKG is nonischemic. Chest x-ray shows no acute abnormality. Initial troponin is negative. Patient reports relief with GI cocktail. Will place patient on a PPI daily.  After history, exam, and medical workup I feel the patient has been appropriately medically screened and is safe for discharge home. Pertinent diagnoses were discussed with the patient. Patient was given return precautions.  I personally performed the services described in this documentation, which was scribed in my presence. The recorded information has been reviewed and is accurate.     Shon Baton, MD 02/15/15 [phone]

## 2015-02-14 NOTE — ED Notes (Signed)
Right sided chest pain that started 2 hours PTA. Patient states pain radiates to arm and neck.

## 2015-02-15 LAB — TROPONIN I: Troponin I: <0.03 ng/mL (ref <0.031)

## 2015-02-15 IMAGING — DX DG CHEST 2V
2 series · 2 of 2 positions shown · non-contrast
Comparison: Chest radiograph performed [DATE]

CLINICAL DATA: Acute onset of sharp heavy chest pain, radiating to
the upper back. Initial encounter.

EXAM:
CHEST  2 VIEW

[chest pa]
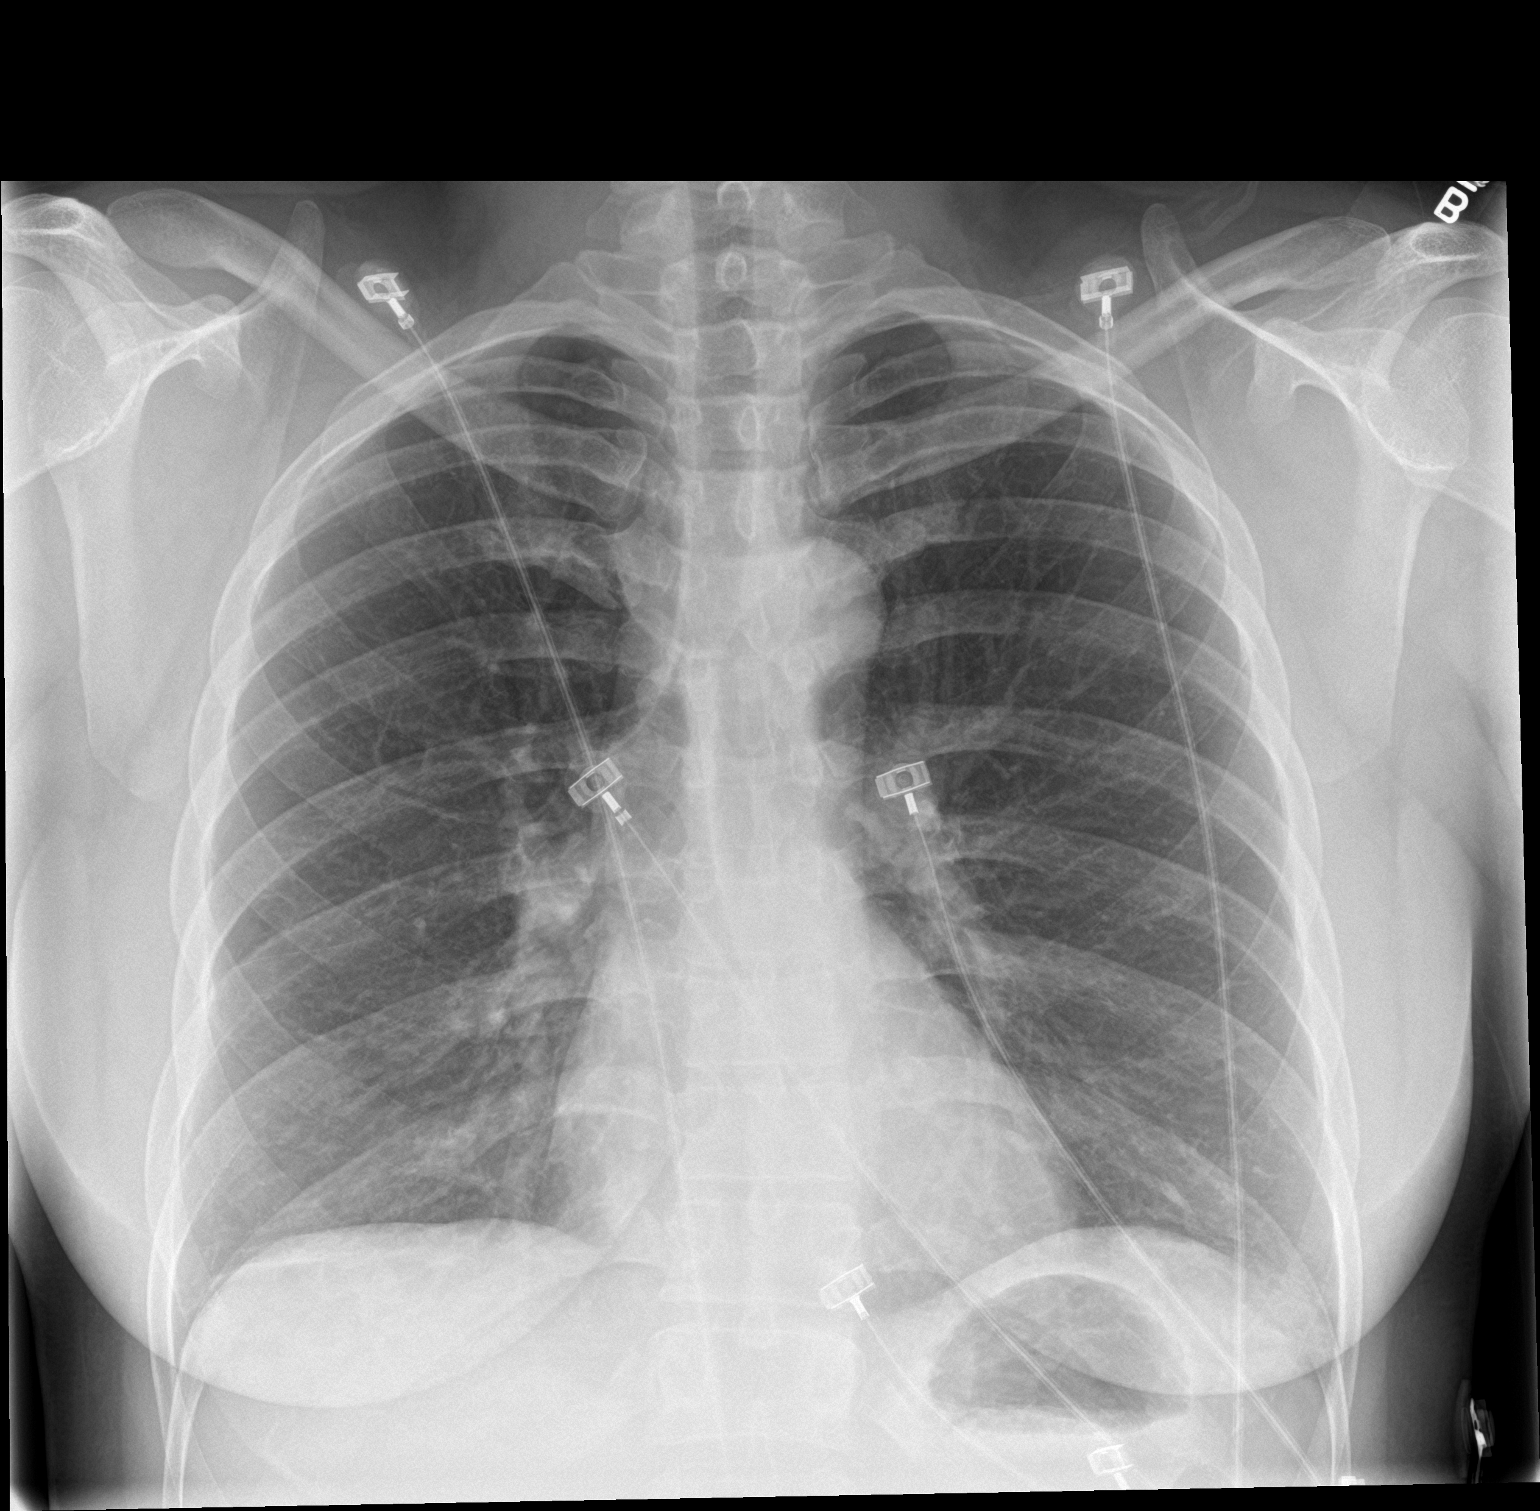

[chest lat]
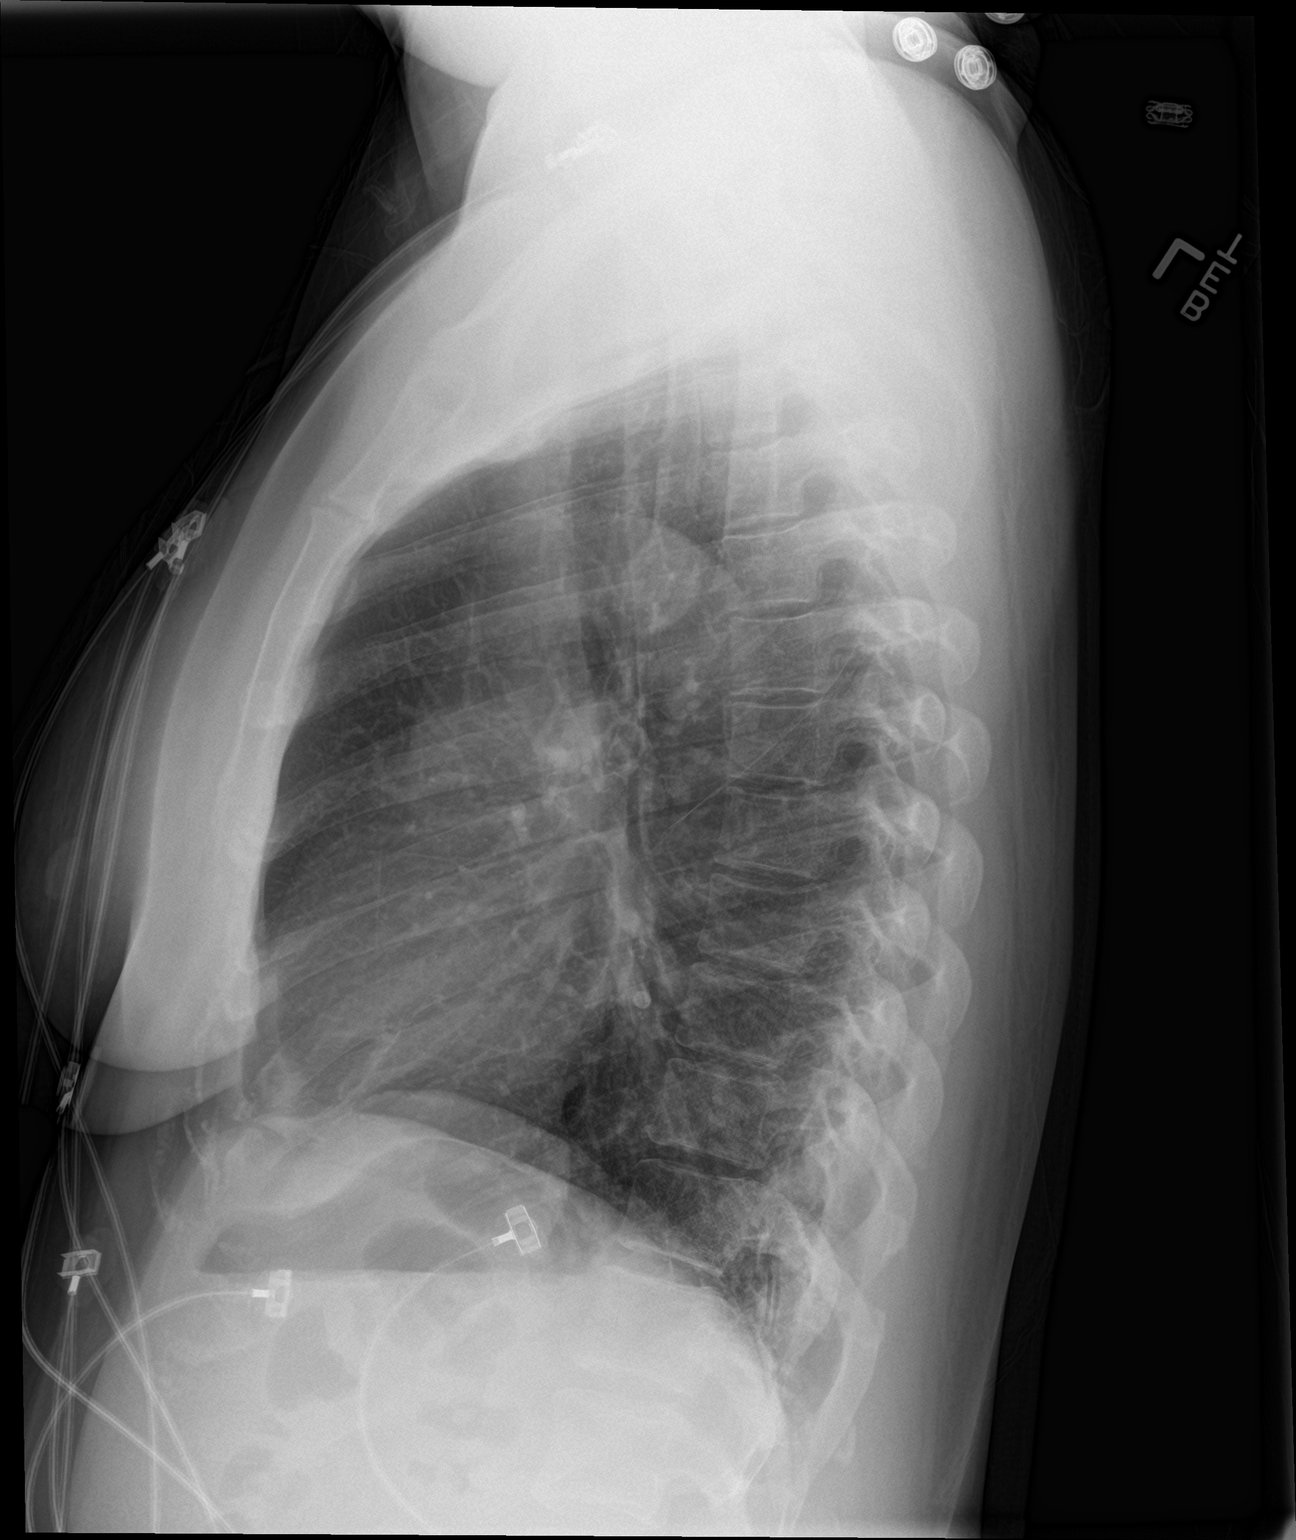

[2 of 2 positions shown; findings below may reference images not displayed]

FINDINGS: The lungs are well-aerated and clear. There is no evidence of focal
opacification, pleural effusion or pneumothorax.

The heart is normal in size; the mediastinal contour is within
normal limits. No acute osseous abnormalities are seen.
IMPRESSION: No acute cardiopulmonary process seen.

## 2015-02-15 MED ORDER — OMEPRAZOLE 20 MG PO CPDR: 20.0000 mg | DELAYED_RELEASE_CAPSULE | Freq: Every day | ORAL | Status: DC

## 2015-02-15 NOTE — Discharge Instructions (Signed)

## 2015-02-15 NOTE — ED Notes (Signed)
Pt reporting relief from GI cocktail

## 2018-10-17 ENCOUNTER — Emergency Department (HOSPITAL_COMMUNITY)
Admission: EM | Admit: 2018-10-17 | Discharge: 2018-10-17 | Disposition: A | Discharge status: Home / Self Care | Payer: BC Managed Care – PPO | Attending: Emergency Medicine | Admitting: Emergency Medicine

## 2018-10-17 ENCOUNTER — Encounter (HOSPITAL_COMMUNITY): Payer: Self-pay

## 2018-10-17 ENCOUNTER — Other Ambulatory Visit: Payer: Self-pay

## 2018-10-17 ENCOUNTER — Emergency Department (HOSPITAL_COMMUNITY): Payer: BC Managed Care – PPO

## 2018-10-17 DIAGNOSIS — N3000 Acute cystitis without hematuria: Secondary | ICD-10-CM

## 2018-10-17 DIAGNOSIS — F17210 Nicotine dependence, cigarettes, uncomplicated: Secondary | ICD-10-CM | POA: Diagnosis not present

## 2018-10-17 DIAGNOSIS — N3090 Cystitis, unspecified without hematuria: Secondary | ICD-10-CM | POA: Insufficient documentation

## 2018-10-17 DIAGNOSIS — R0602 Shortness of breath: Secondary | ICD-10-CM | POA: Diagnosis present

## 2018-10-17 LAB — COMPREHENSIVE METABOLIC PANEL
ALT: 15 U/L (ref 0–44)
AST: 15 U/L (ref 15–41)
Albumin: 4 g/dL (ref 3.5–5.0)
Alkaline Phosphatase: 85 U/L (ref 38–126)
Anion gap: 7 (ref 5–15)
BUN: 9 mg/dL (ref 6–20)
CO2: 26 mmol/L (ref 22–32)
Calcium: 9.9 mg/dL (ref 8.9–10.3)
Chloride: 106 mmol/L (ref 98–111)
Creatinine, Ser: 0.63 mg/dL (ref 0.44–1.00)
GFR calc Af Amer: >60 mL/min (ref >60)
GFR calc non Af Amer: >60 mL/min (ref >60)
Glucose, Bld: 101 mg/dL — ABNORMAL HIGH (ref 70–99)
Potassium: 3.5 mmol/L (ref 3.5–5.1)
Sodium: 139 mmol/L (ref 135–145)
Total Bilirubin: 0.3 mg/dL (ref 0.3–1.2)
Total Protein: 7.4 g/dL (ref 6.5–8.1)

## 2018-10-17 LAB — CBC WITH DIFFERENTIAL/PLATELET
Abs Immature Granulocytes: 0.03 10*3/uL (ref 0.00–0.07)
Basophils Absolute: 0 10*3/uL (ref 0.0–0.1)
Basophils Relative: 0 %
Eosinophils Absolute: 0.1 10*3/uL (ref 0.0–0.5)
Eosinophils Relative: 1 %
HCT: 40.6 % (ref 36.0–46.0)
Hemoglobin: 13.6 g/dL (ref 12.0–15.0)
Immature Granulocytes: 0 %
Lymphocytes Relative: 37 %
Lymphs Abs: 4 10*3/uL (ref 0.7–4.0)
MCH: 33.3 pg (ref 26.0–34.0)
MCHC: 33.5 g/dL (ref 30.0–36.0)
MCV: 99.5 fL (ref 80.0–100.0)
Monocytes Absolute: 0.7 10*3/uL (ref 0.1–1.0)
Monocytes Relative: 6 %
Neutro Abs: 6.1 10*3/uL (ref 1.7–7.7)
Neutrophils Relative %: 56 %
Platelets: 240 10*3/uL (ref 150–400)
RBC: 4.08 MIL/uL (ref 3.87–5.11)
RDW: 13.8 % (ref 11.5–15.5)
WBC: 10.8 10*3/uL — ABNORMAL HIGH (ref 4.0–10.5)
nRBC: 0 % (ref 0.0–0.2)

## 2018-10-17 LAB — URINALYSIS, ROUTINE W REFLEX MICROSCOPIC
Bilirubin Urine: NEGATIVE
Glucose, UA: NEGATIVE mg/dL
Ketones, ur: NEGATIVE mg/dL
Leukocytes,Ua: NEGATIVE
Nitrite: POSITIVE — AB
Protein, ur: NEGATIVE mg/dL
Specific Gravity, Urine: 1.024 (ref 1.005–1.030)
pH: 5 (ref 5.0–8.0)

## 2018-10-17 LAB — LIPASE, BLOOD: Lipase: 27 U/L (ref 11–51)

## 2018-10-17 LAB — PREGNANCY, URINE: Preg Test, Ur: NEGATIVE

## 2018-10-17 IMAGING — DX DG CHEST 2V
2 series · 2 of 2 positions shown · non-contrast
Comparison: [DATE].

CLINICAL DATA: Abdominal pain.

EXAM:
CHEST - 2 VIEW

[chest pa]
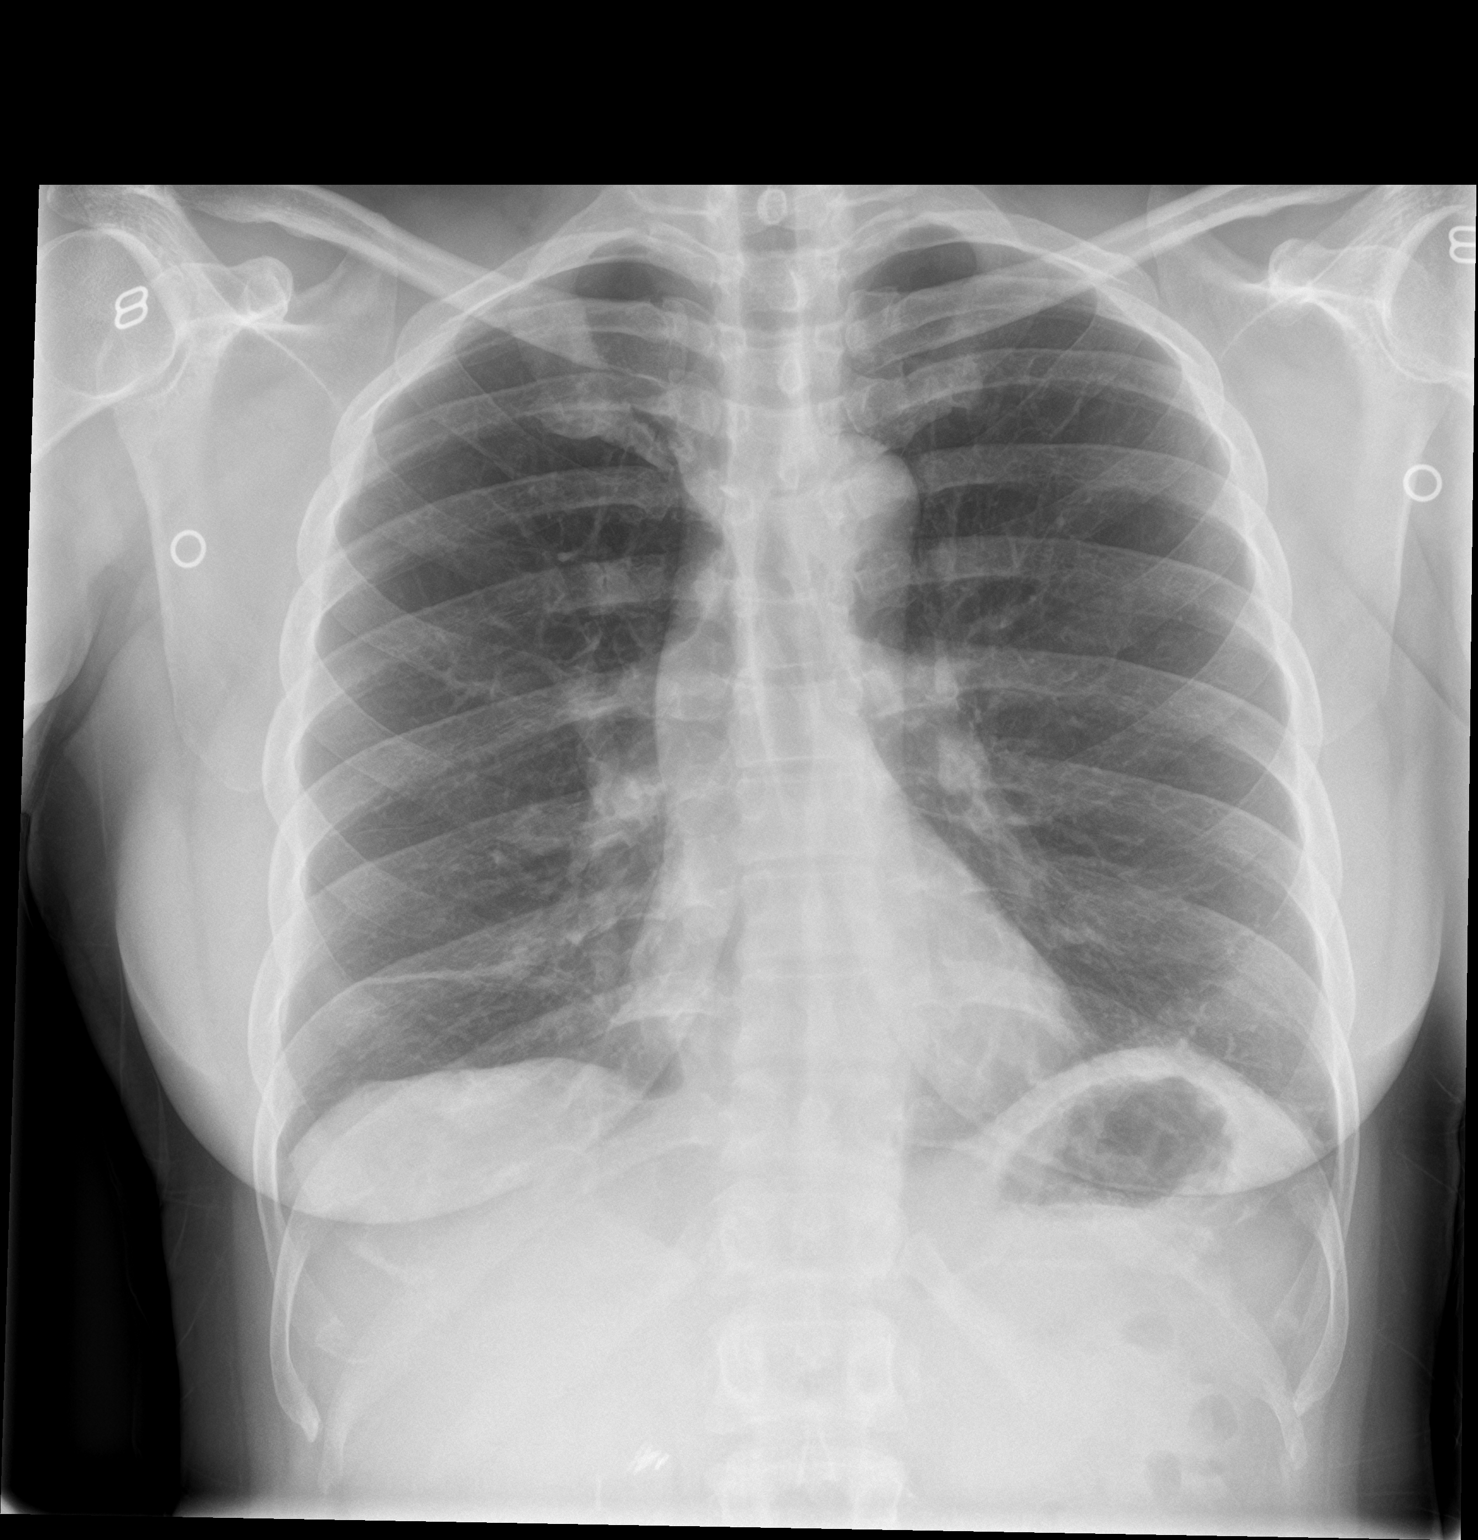

[chest lat]
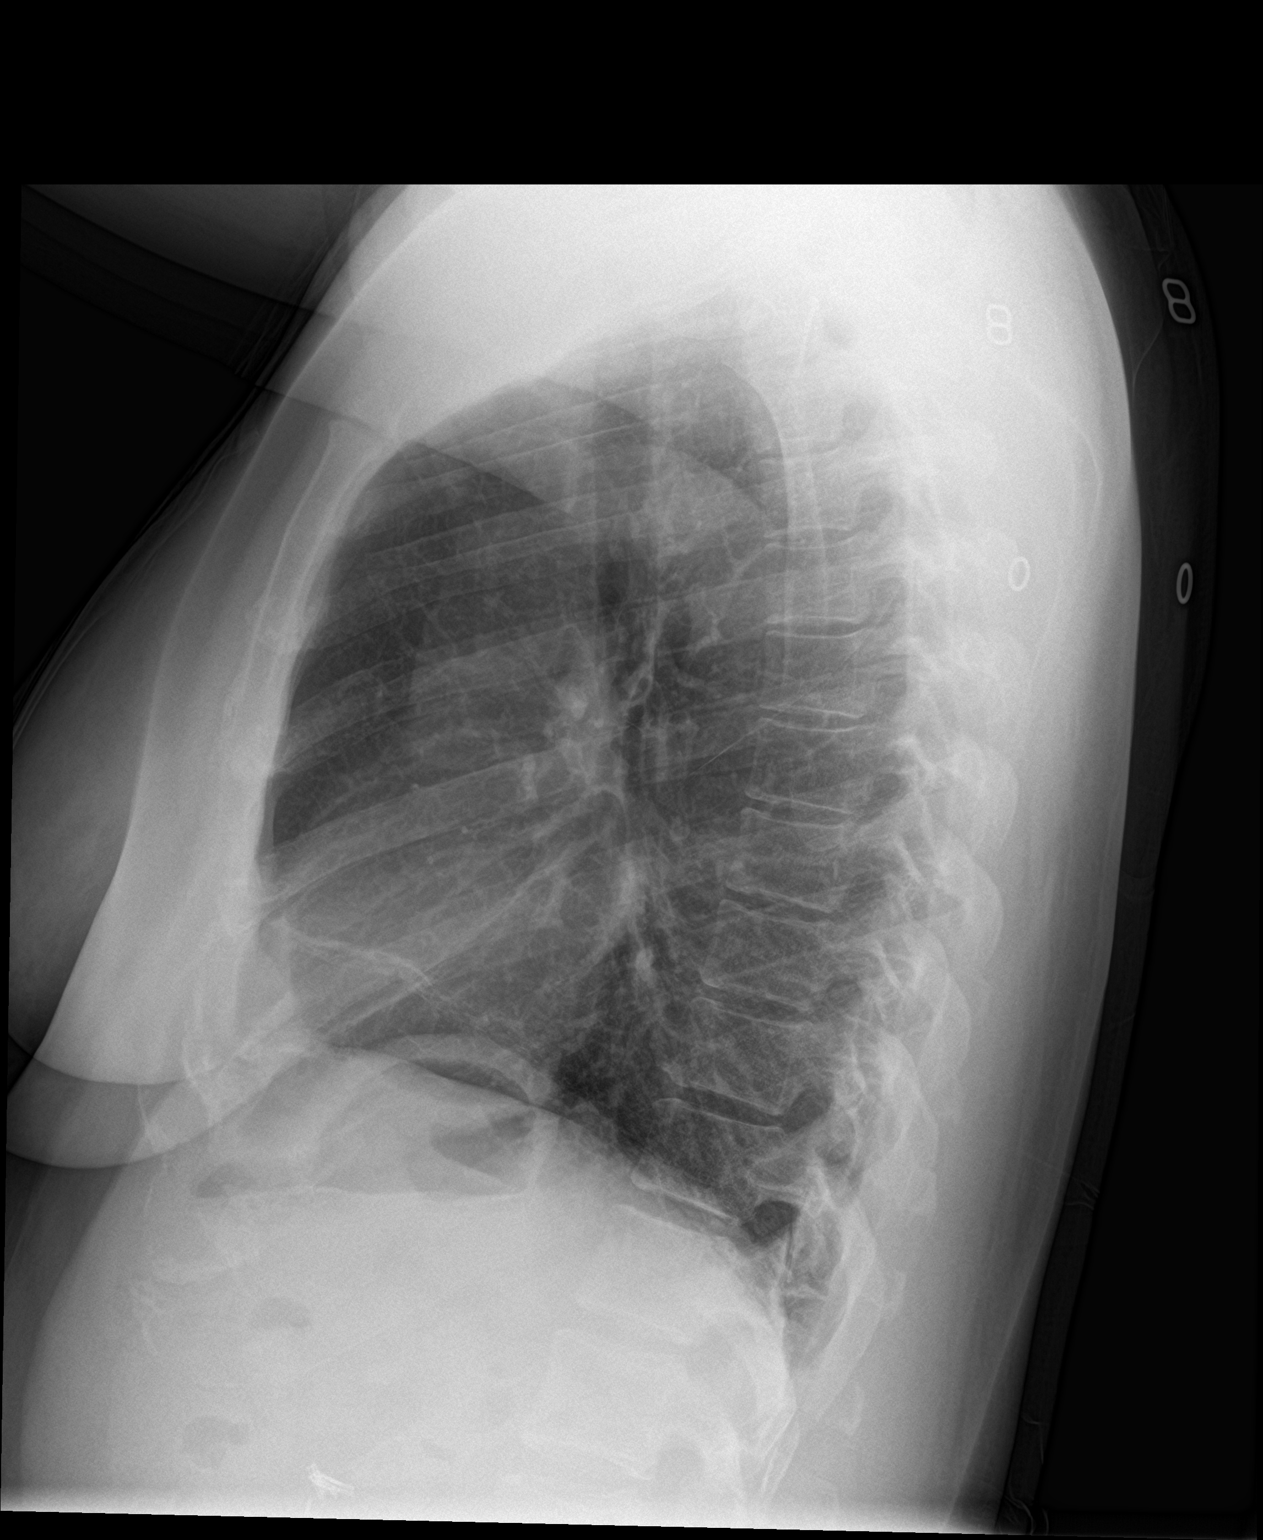

[2 of 2 positions shown; findings below may reference images not displayed]

FINDINGS: Mediastinum and hilar structures normal. Mild right base
subsegmental atelectasis. No pleural effusion or pneumothorax.
Surgical clips right upper quadrant. Heart size normal. No acute
bony abnormality.
IMPRESSION: Mild right base subsegmental atelectasis.

## 2018-10-17 MED ORDER — SULFAMETHOXAZOLE-TRIMETHOPRIM 800-160 MG PO TABS
1.0000 | ORAL_TABLET | Freq: Once | ORAL | Status: AC
  Administered 2018-10-17: 17:00:00 1 via ORAL
  Filled 2018-10-17: qty 1

## 2018-10-17 MED ORDER — PHENAZOPYRIDINE HCL 200 MG PO TABS: 200.0000 mg | ORAL_TABLET | Freq: Three times a day (TID) | ORAL | 0 refills | Status: AC | PRN

## 2018-10-17 MED ORDER — SULFAMETHOXAZOLE-TRIMETHOPRIM 800-160 MG PO TABS: 1.0000 | ORAL_TABLET | Freq: Two times a day (BID) | ORAL | 0 refills | Status: AC

## 2018-10-17 MED ORDER — PHENAZOPYRIDINE HCL 100 MG PO TABS
200.0000 mg | ORAL_TABLET | Freq: Once | ORAL | Status: AC
  Administered 2018-10-17: 200 mg via ORAL
  Filled 2018-10-17: qty 2

## 2018-10-17 NOTE — Discharge Instructions (Signed)
Thank you for allowing me to care for you today. Please return to the emergency department if you have new or worsening symptoms. Take your medications as instructed.  ° °

## 2018-10-17 NOTE — ED Triage Notes (Signed)
Pt is having upper abdominal pain that radiates to the back. States it hurts her to cough. Started Saturday night at 9pm. NAD.

## 2018-10-17 NOTE — ED Provider Notes (Signed)
[facility] EMERGENCY DEPARTMENT Provider Note   CSN: [id number] Arrival date & time: 10/17/18  1241     History   Chief Complaint Chief Complaint  Patient presents with   Shortness of Breath    HPI Kimberly Rocha is a 43 y.o. female.     Patient is a 43 year old female with past medical history of cholecystectomy who presents the emergency department for abdominal pain and shortness of breath.  Patient reports that the symptoms started this past Saturday night.  Reports that they have been waxing and waning.  No exacerbating or relieving factors.  Reports that the pain is mid abdomen that radiates to the right side and the right flank.  She has not tried anything for relief.  The pain is worse with taking a deep breath.  Denies any chest pain, exertional pain.  Denies any dysuria, vaginal discharge, vaginal bleeding.  She reports that this pain feels very similar to when she had a urinary tract flexion in the past.  She does report that she has some lower pelvic pressure.  Denies concern for any sexually transmitted infections.     Past Medical History:  Diagnosis Date   Gallstones     There are no active problems to display for this patient.   Past Surgical History:  Procedure Laterality Date   CESAREAN SECTION     CHOLECYSTECTOMY N/A 08/22/2012   Procedure: LAPAROSCOPIC CHOLECYSTECTOMY;  Surgeon: Jamesetta So, MD;  Location: [facility];  Service: General;  Laterality: N/A;   HERNIA REPAIR     TUBAL LIGATION       OB History   No obstetric history on file.      Home Medications    Prior to Admission medications   Medication Sig Start Date End Date Taking? Authorizing Provider  benzonatate (TESSALON) 200 MG capsule Take 1 capsule (200 mg total) by mouth 3 (three) times daily as needed for cough. 03/21/13   Evalee Jefferson, PA-C  celecoxib (CELEBREX) 100 MG capsule Take 1 capsule (100 mg total) by mouth 2 (two) times daily. 02/15/14   Lily Kocher, PA-C    dextromethorphan-guaiFENesin [facility] DM) 30-600 MG per 12 hr tablet Take 1 tablet by mouth 2 (two) times daily.    [provider]  DM-Doxylamine-Acetaminophen (NYQUIL COLD & FLU PO) Take 2 capsules by mouth every 4 (four) hours as needed. Cold/flu sx    [provider]  HYDROcodone-acetaminophen (NORCO/VICODIN) 5-325 MG per tablet Take 1 tablet by mouth every 4 (four) hours as needed. 02/15/14   Lily Kocher, PA-C  ibuprofen (ADVIL,MOTRIN) 200 MG tablet Take 400 mg by mouth every 6 (six) hours as needed for pain.    [provider]  methocarbamol (ROBAXIN) 500 MG tablet Take 1 tablet (500 mg total) by mouth 3 (three) times daily. 02/15/14   Lily Kocher, PA-C  omeprazole (PRILOSEC) 20 MG capsule Take 1 capsule (20 mg total) by mouth daily. 02/15/15   Horton, Barbette Hair, MD  phenazopyridine (PYRIDIUM) 200 MG tablet Take 1 tablet (200 mg total) by mouth 3 (three) times daily as needed for up to 2 days for pain. 10/17/18 10/19/18  Madilyn Hook A, PA-C  sulfamethoxazole-trimethoprim (BACTRIM DS) 800-160 MG tablet Take 1 tablet by mouth 2 (two) times daily for 7 days. 10/17/18 10/24/18  Alveria Apley, PA-C    Family History No family history on file.  Social History Social History   Tobacco Use   Smoking status: Current Every Day Smoker  Packs/day: 0.50    Types: Cigarettes   Smokeless tobacco: Never Used  Substance Use Topics   Alcohol use: Yes    Comment: occ. use   Drug use: No     Allergies   Demerol   Review of Systems Review of Systems  Constitutional: Negative for chills and fever.  HENT: Negative for ear pain and sore throat.   Eyes: Negative for pain and visual disturbance.  Respiratory: Positive for shortness of breath. Negative for cough.   Cardiovascular: Negative for chest pain and palpitations.  Gastrointestinal: Positive for abdominal pain. Negative for nausea and vomiting.  Genitourinary: Positive for flank pain. Negative for  decreased urine volume, dysuria, hematuria, vaginal bleeding and vaginal discharge.  Musculoskeletal: Negative for arthralgias and back pain.  Skin: Negative for color change and rash.  Neurological: Negative for syncope.  All other systems reviewed and are negative.    Physical Exam Updated Vital Signs BP 127/78 (BP Location: Right Arm)    Pulse 86    Temp 98.3 F (36.8 C) (Oral)    Resp 16    Ht 5' (1.524 m)    Wt 70.3 kg    LMP 10/02/2018    SpO2 100%    BMI 30.27 kg/m   Physical Exam Vitals signs and nursing note reviewed.  Constitutional:      General: She is not in acute distress.    Appearance: Normal appearance. She is well-developed. She is not ill-appearing, toxic-appearing or diaphoretic.  HENT:     Head: Normocephalic and atraumatic.     Mouth/Throat:     Mouth: Mucous membranes are moist.  Eyes:     Conjunctiva/sclera: Conjunctivae normal.  Cardiovascular:     Rate and Rhythm: Normal rate and regular rhythm.  Pulmonary:     Effort: Pulmonary effort is normal.     Breath sounds: Normal breath sounds.  Chest:     Chest wall: No mass or deformity.  Abdominal:     General: Bowel sounds are normal.     Palpations: Abdomen is soft.     Tenderness: There is abdominal tenderness (RUQ). There is no guarding or rebound.  Skin:    General: Skin is dry.  Neurological:     General: No focal deficit present.     Mental Status: She is alert.  Psychiatric:        Mood and Affect: Mood normal.      ED Treatments / Results  Labs (all labs ordered are listed, but only abnormal results are displayed) Labs Reviewed  CBC WITH DIFFERENTIAL/PLATELET - Abnormal; Notable for the following components:      Result Value   WBC 10.8 (*)    All other components within normal limits  COMPREHENSIVE METABOLIC PANEL - Abnormal; Notable for the following components:   Glucose, Bld 101 (*)    All other components within normal limits  URINALYSIS, ROUTINE W REFLEX MICROSCOPIC -  Abnormal; Notable for the following components:   APPearance HAZY (*)    Hgb urine dipstick SMALL (*)    Nitrite POSITIVE (*)    Bacteria, UA MANY (*)    All other components within normal limits  URINE CULTURE  LIPASE, BLOOD  PREGNANCY, URINE    EKG None  Radiology Dg Chest 2 View  Result Date: 10/17/2018 CLINICAL DATA:  Abdominal pain. EXAM: CHEST - 2 VIEW COMPARISON:  02/15/2015. FINDINGS: Mediastinum and hilar structures normal. Mild right base subsegmental atelectasis. No pleural effusion or pneumothorax. Surgical clips right upper quadrant.  Heart size normal. No acute bony abnormality. IMPRESSION: Mild right base subsegmental atelectasis. Electronically Signed   By: Maisie Fus  Register   On: 10/17/2018 13:36    Procedures Procedures (including critical care time)  Medications Ordered in ED Medications  sulfamethoxazole-trimethoprim (BACTRIM DS) 800-160 MG per tablet 1 tablet (has no administration in time range)  phenazopyridine (PYRIDIUM) tablet 200 mg (has no administration in time range)     Initial Impression / Assessment and Plan / ED Course  I have reviewed the triage vital signs and the nursing notes.  Pertinent labs & imaging results that were available during my care of the patient were reviewed by me and considered in my medical decision making (see chart for details).  Clinical Course as of Oct 16 1648  Mon Oct 17, 2018  872 43 year old female presenting with shortness of breath and abdominal pain similar to when she had a urinary tract infection in the past.  Work-up reassuring with a normal metabolic panel, lipase, chest x-ray.  Vitals are normal.  Her urinalysis is consistent with a urinary tract infection and she does have a very mildly increased white blood cell count of 10.8.  She is not septic and does not have CVA tenderness.  Treated her with Bactrim and Pyridium.  She did declined any sexually transmitted infection testing.  Advised on strict return  precautions and advised to follow-up with her primary care doctor in 2 to 3 days.   [KM]    Clinical Course User Index [KM] Arlyn Dunning, PA-C       Based on review of vitals, medical screening exam, lab work and/or imaging, there does not appear to be an acute, emergent etiology for the patient's symptoms. Counseled pt on good return precautions and encouraged both PCP and ED follow-up as needed.  Prior to discharge, I also discussed incidental imaging findings with patient in detail and advised appropriate, recommended follow-up in detail.  Clinical Impression: 1. Acute cystitis without hematuria     Disposition: Discharge  Prior to providing a prescription for a controlled substance, I independently reviewed the patient's recent prescription history on the [place] Controlled Substance Reporting System. The patient had no recent or regular prescriptions and was deemed appropriate for a brief, less than 3 day prescription of narcotic for acute analgesia.  This note was prepared with assistance of Conservation officer, historic buildings. Occasional wrong-word or sound-a-like substitutions may have occurred due to the inherent limitations of voice recognition software.   Final Clinical Impressions(s) / ED Diagnoses   Final diagnoses:  Acute cystitis without hematuria    ED Discharge Orders         Ordered    phenazopyridine (PYRIDIUM) 200 MG tablet  3 times daily PRN     10/17/18 1649    sulfamethoxazole-trimethoprim (BACTRIM DS) 800-160 MG tablet  2 times daily     10/17/18 1649           Jeral Pinch 10/17/18 1650    Raeford Razor, MD 10/18/18 [phone]

## 2018-10-20 LAB — URINE CULTURE: Culture: >=100000 — AB

## 2018-10-21 ENCOUNTER — Telehealth: Payer: Self-pay

## 2018-10-21 ENCOUNTER — Telehealth (HOSPITAL_COMMUNITY): Payer: Self-pay | Admitting: Pharmacist

## 2018-10-21 NOTE — Telephone Encounter (Signed)
Post ED Visit - Positive Culture Follow-up: Unsuccessful Patient Follow-up  Culture assessed and recommendations reviewed by:  []  Elenor Quinones, Pharm.D. []  Heide Guile, Pharm.D., BCPS AQ-ID []  Parks Neptune, Pharm.D., BCPS []  Alycia Rossetti, Pharm.D., BCPS []  [place], Pharm.D., BCPS, AAHIVP []  Legrand Como, Pharm.D., BCPS, AAHIVP []  Wynell Balloon, PharmD []  Vincenza Hews, PharmD, BCPS [facility] Pharm D Positive urine culture  []  Patient discharged without antimicrobial prescription and treatment is now indicated [x]  Organism is resistant to prescribed ED discharge antimicrobial []  Patient with positive blood cultures   Unable to contact patient after 3 attempts, letter will be sent to address on file  Genia Del 10/21/2018, 10:39 AM

## 2018-10-21 NOTE — Progress Notes (Signed)
ED Antimicrobial Stewardship Positive Culture Follow Up   Kimberly Rocha is an 43 y.o. female who presented to [facility] on (Not on file) with a chief complaint of No chief complaint on file.   Recent Results (from the past 720 hour(s))  Urine culture     Status: Abnormal   Collection Time: 10/17/18  4:51 PM   Specimen: Urine, Random  Result Value Ref Range Status   Specimen Description   Final    URINE, RANDOM Performed at [facility], [address] [place], [place] [address]    Special Requests   Final    NONE Performed at [facility], [address], [place], [place] [address]    Culture >=100,000 COLONIES/mL ESCHERICHIA COLI (A)  Final   Report Status 10/20/2018 FINAL  Final   Organism ID, Bacteria ESCHERICHIA COLI (A)  Final      Susceptibility   Escherichia coli - MIC*    AMPICILLIN >=32 RESISTANT Resistant     CEFAZOLIN <=4 SENSITIVE Sensitive     CEFTRIAXONE <=1 SENSITIVE Sensitive     CIPROFLOXACIN 0.5 SENSITIVE Sensitive     GENTAMICIN <=1 SENSITIVE Sensitive     IMIPENEM <=0.25 SENSITIVE Sensitive     NITROFURANTOIN <=16 SENSITIVE Sensitive     TRIMETH/SULFA >=320 RESISTANT Resistant     AMPICILLIN/SULBACTAM 16 INTERMEDIATE Intermediate     PIP/TAZO <=4 SENSITIVE Sensitive     Extended ESBL NEGATIVE Sensitive     * >=100,000 COLONIES/mL ESCHERICHIA COLI    [x]  Treated with Bactrim, organism resistant to prescribed antimicrobial []  Patient discharged originally without antimicrobial agent and treatment is now indicated  New antibiotic prescription: Keflex 500 mg po bid x 7 days   ED Provider: Jamal Collin, PA-C   MastersJake Church 10/21/2018, 11:11 AM Clinical Pharmacist Monday - Friday phone -  [phone] Saturday - Sunday phone - [phone]

## 2019-11-12 ENCOUNTER — Emergency Department (HOSPITAL_COMMUNITY): Payer: Self-pay

## 2019-11-12 ENCOUNTER — Other Ambulatory Visit: Payer: Self-pay

## 2019-11-12 ENCOUNTER — Observation Stay (HOSPITAL_COMMUNITY)
Admission: EM | Admit: 2019-11-12 | Discharge: 2019-11-13 | Disposition: A | Discharge status: Home / Self Care | Payer: Self-pay | Attending: Internal Medicine | Admitting: Internal Medicine

## 2019-11-12 ENCOUNTER — Encounter (HOSPITAL_COMMUNITY): Payer: Self-pay

## 2019-11-12 DIAGNOSIS — F17210 Nicotine dependence, cigarettes, uncomplicated: Secondary | ICD-10-CM | POA: Insufficient documentation

## 2019-11-12 DIAGNOSIS — Z20822 Contact with and (suspected) exposure to covid-19: Secondary | ICD-10-CM | POA: Insufficient documentation

## 2019-11-12 DIAGNOSIS — Z720 Tobacco use: Secondary | ICD-10-CM | POA: Diagnosis present

## 2019-11-12 DIAGNOSIS — I2699 Other pulmonary embolism without acute cor pulmonale: Principal | ICD-10-CM

## 2019-11-12 LAB — BASIC METABOLIC PANEL
Anion gap: 7 (ref 5–15)
BUN: 7 mg/dL (ref 6–20)
CO2: 25 mmol/L (ref 22–32)
Calcium: 9.2 mg/dL (ref 8.9–10.3)
Chloride: 106 mmol/L (ref 98–111)
Creatinine, Ser: 0.78 mg/dL (ref 0.44–1.00)
GFR, Estimated: >60 mL/min (ref >60)
Glucose, Bld: 98 mg/dL (ref 70–99)
Potassium: 3.7 mmol/L (ref 3.5–5.1)
Sodium: 138 mmol/L (ref 135–145)

## 2019-11-12 LAB — TROPONIN I (HIGH SENSITIVITY)
Troponin I (High Sensitivity): <2 ng/L (ref <18)
Troponin I (High Sensitivity): <2 ng/L (ref <18)

## 2019-11-12 LAB — CBC
HCT: 40.3 % (ref 36.0–46.0)
Hemoglobin: 13.2 g/dL (ref 12.0–15.0)
MCH: 32.8 pg (ref 26.0–34.0)
MCHC: 32.8 g/dL (ref 30.0–36.0)
MCV: 100 fL (ref 80.0–100.0)
Platelets: 233 10*3/uL (ref 150–400)
RBC: 4.03 MIL/uL (ref 3.87–5.11)
RDW: 13.7 % (ref 11.5–15.5)
WBC: 10.5 10*3/uL (ref 4.0–10.5)
nRBC: 0 % (ref 0.0–0.2)

## 2019-11-12 LAB — RESPIRATORY PANEL BY RT PCR (FLU A&B, COVID)
Influenza A by PCR: NEGATIVE
Influenza B by PCR: NEGATIVE
SARS Coronavirus 2 by RT PCR: NEGATIVE

## 2019-11-12 IMAGING — CT CT ANGIO CHEST
2 of 6 series · 18 of 46 positions shown · IV contrast (Omnipaque or Isovue)
Comparison: Same day chest radiograph

CLINICAL DATA: Sudden sharp back and chest pain, worse with
inspiration

EXAM:
CT ANGIOGRAPHY CHEST WITH CONTRAST
TECHNIQUE: Multidetector CT imaging of the chest was performed using the
standard protocol during bolus administration of intravenous
contrast. Multiplanar CT image reconstructions and MIPs were
obtained to evaluate the vascular anatomy.
CONTRAST:  75mL OMNIPAQUE IOHEXOL 350 MG/ML SOLN

[Series 5: pe axial thins · axial · 0.70mm/px · z∈[+1060,+1317]mm · 15 of 283 slices shown]
[im 13/283  lung]
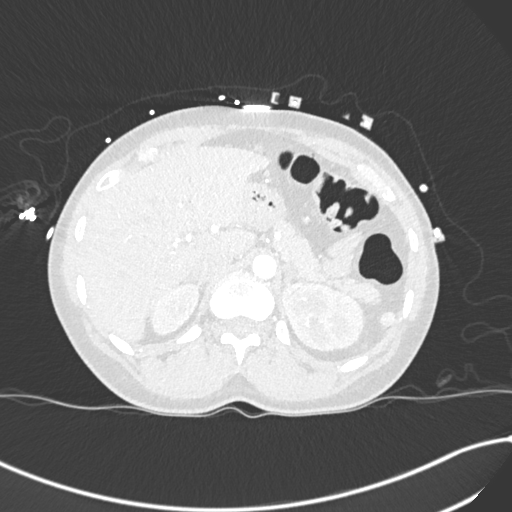
[im 37/283  soft-tissue]
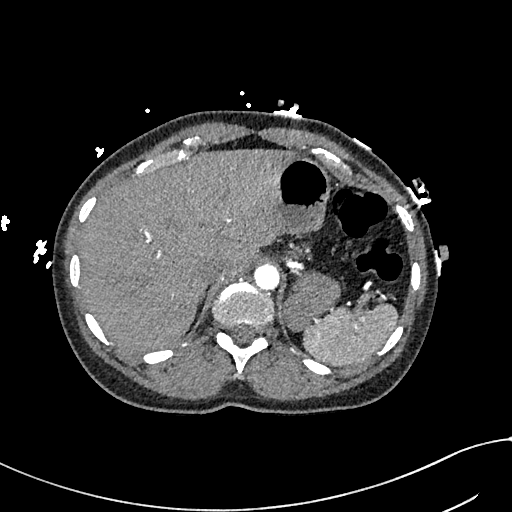
[im 50/283  lung]
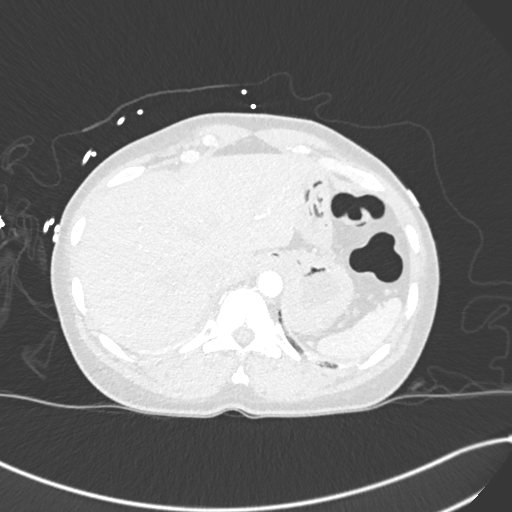
[im 74/283  soft-tissue]
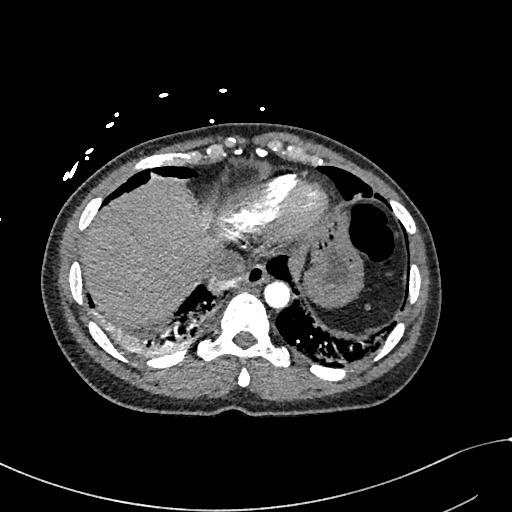
[im 86/283  lung]
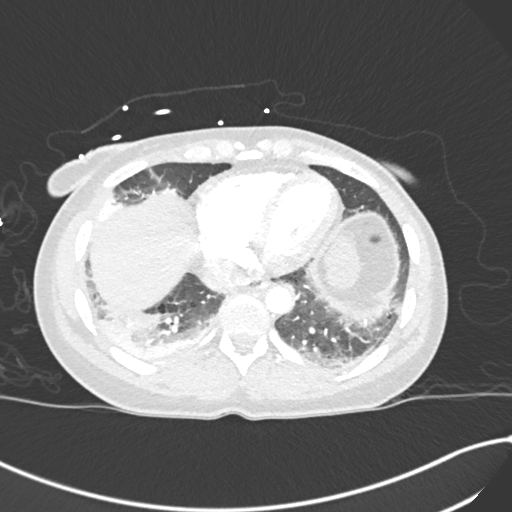
[im 111/283  soft-tissue]
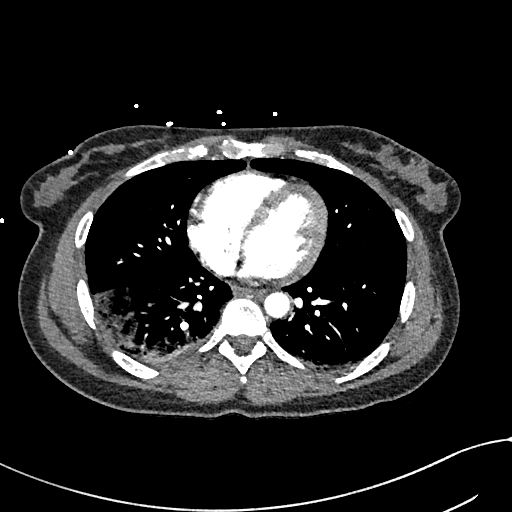
[im 123/283  lung]
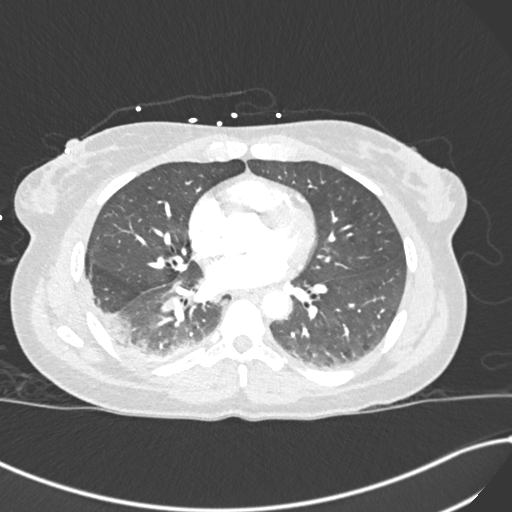
[im 148/283  soft-tissue]
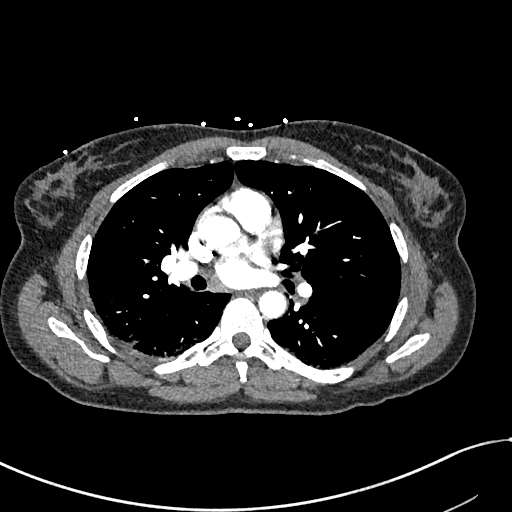
[im 160/283  lung]
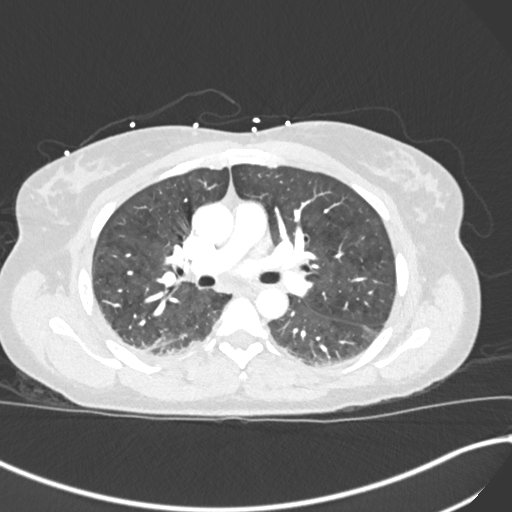
[im 172/283  soft-tissue]
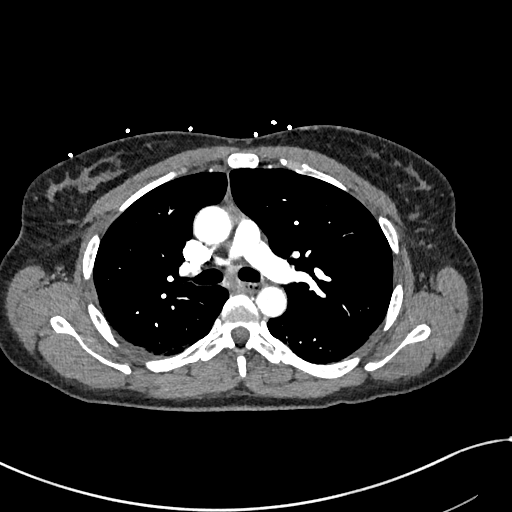
[im 197/283  lung]
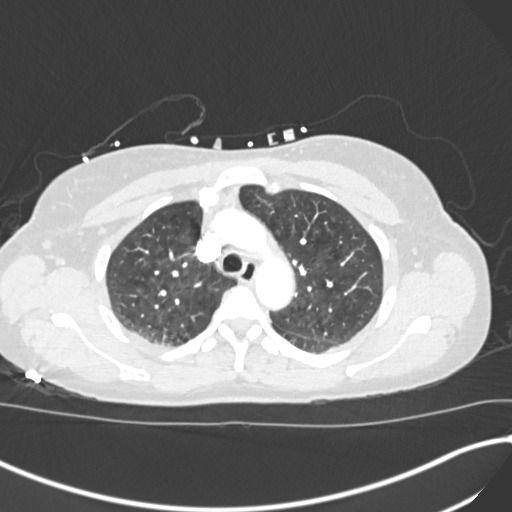
[im 209/283  soft-tissue]
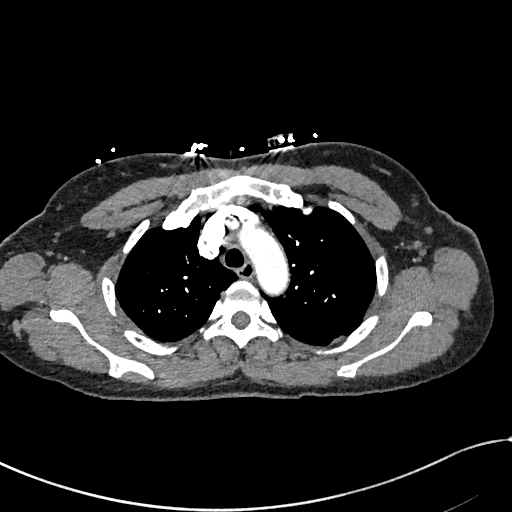
[im 233/283  lung]
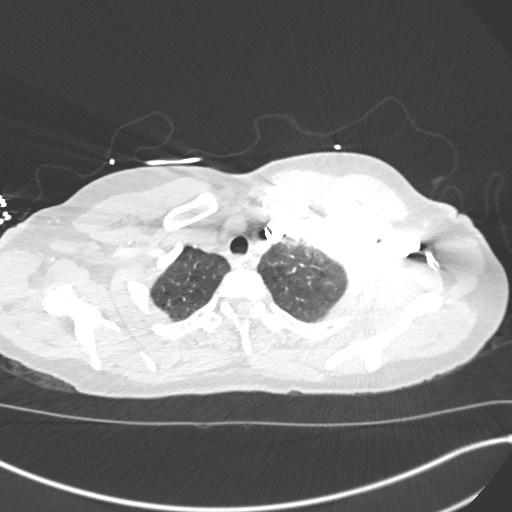
[im 246/283  soft-tissue]
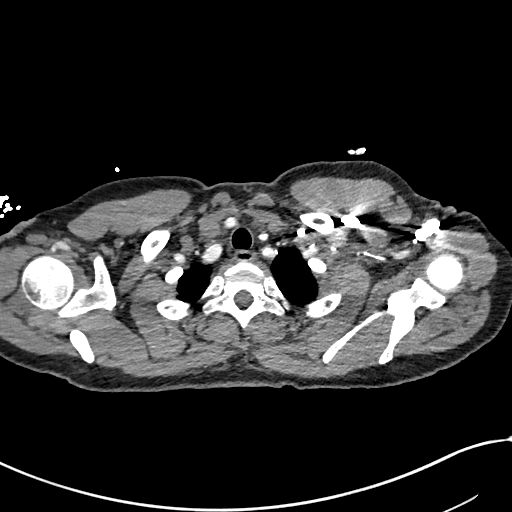
[im 270/283  lung]
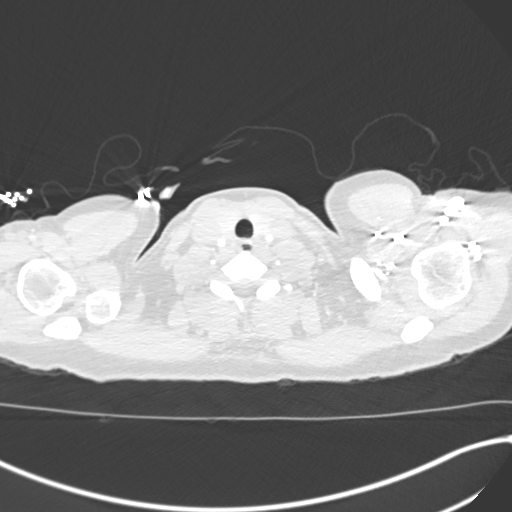

[Series 7: cor soft · coronal · 0.58mm/px · 3 of 115 slices shown]
[im 29/115  soft-tissue]
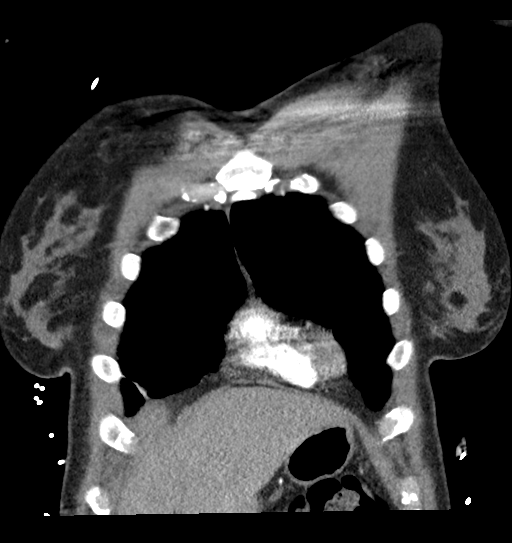
[im 58/115  soft-tissue]
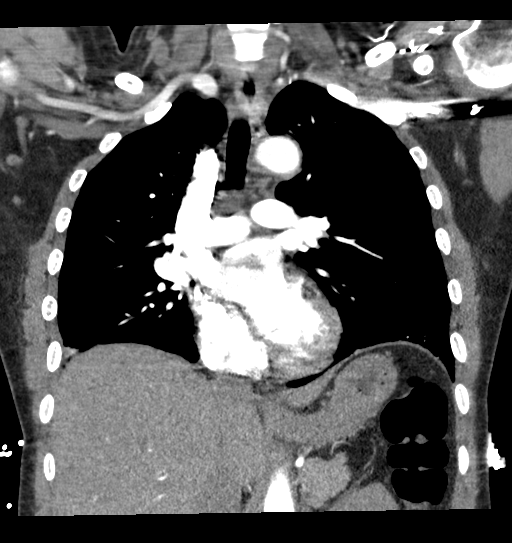
[im 86/115  soft-tissue]
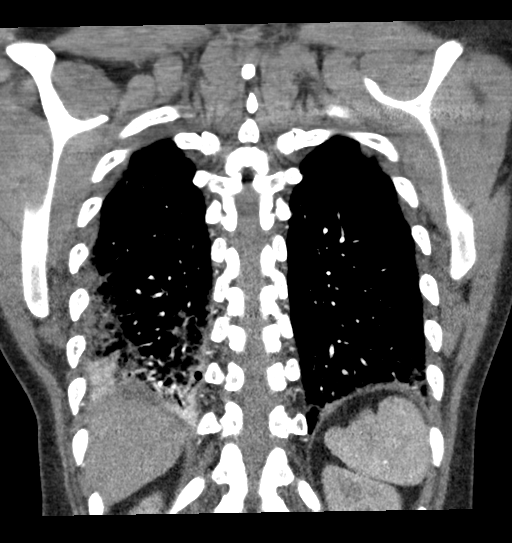

[18 of 46 positions shown; findings below may reference images not displayed]

FINDINGS: Cardiovascular: Satisfactory opacification of the pulmonary arteries
to the segmental level. Positive examination for pulmonary embolism
with segmental to subsegmental embolus present in right lower lobe,
as well as a anterior right upper lobe (series 5, image 152, 127).
No left-sided embolus identified. Normal heart size. RV LV ratio is
preserved at approximately 0.8. No pericardial effusion.

Mediastinum/Nodes: No enlarged mediastinal, hilar, or axillary lymph
nodes. Thyroid gland, trachea, and esophagus demonstrate no
significant findings.

Lungs/Pleura: Trace right pleural effusion. There is a wedge-shaped
subpleural ground-glass opacity of the lateral segment right lower
lobe with central clearing (series 6, image 93). Mild centrilobular
emphysema.

Upper Abdomen: No acute abnormality.

Musculoskeletal: No chest wall abnormality. No acute or significant
osseous findings.

Review of the MIP images confirms the above findings.
IMPRESSION: 1. Positive examination for pulmonary embolism with segmental to
subsegmental embolus present in the right lower lobe, as well as the
anterior right upper lobe.
2. There is a wedge-shaped subpleural ground-glass opacity of the
lateral segment right lower lobe with central clearing, located
distal to embolus and consistent with pulmonary infarction.
3. Trace right pleural effusion.
4. Emphysema ([BT]-[BT]).

These results were called by telephone at the time of interpretation
on [DATE] at [DATE] to Dr. WALIEDDINE, who verbally acknowledged
these results.

## 2019-11-12 IMAGING — DX DG CHEST 2V
2 series · 2 of 2 positions shown · non-contrast
Comparison: [DATE]

CLINICAL DATA: Chest pain

EXAM:
CHEST - 2 VIEW

[chest pa]
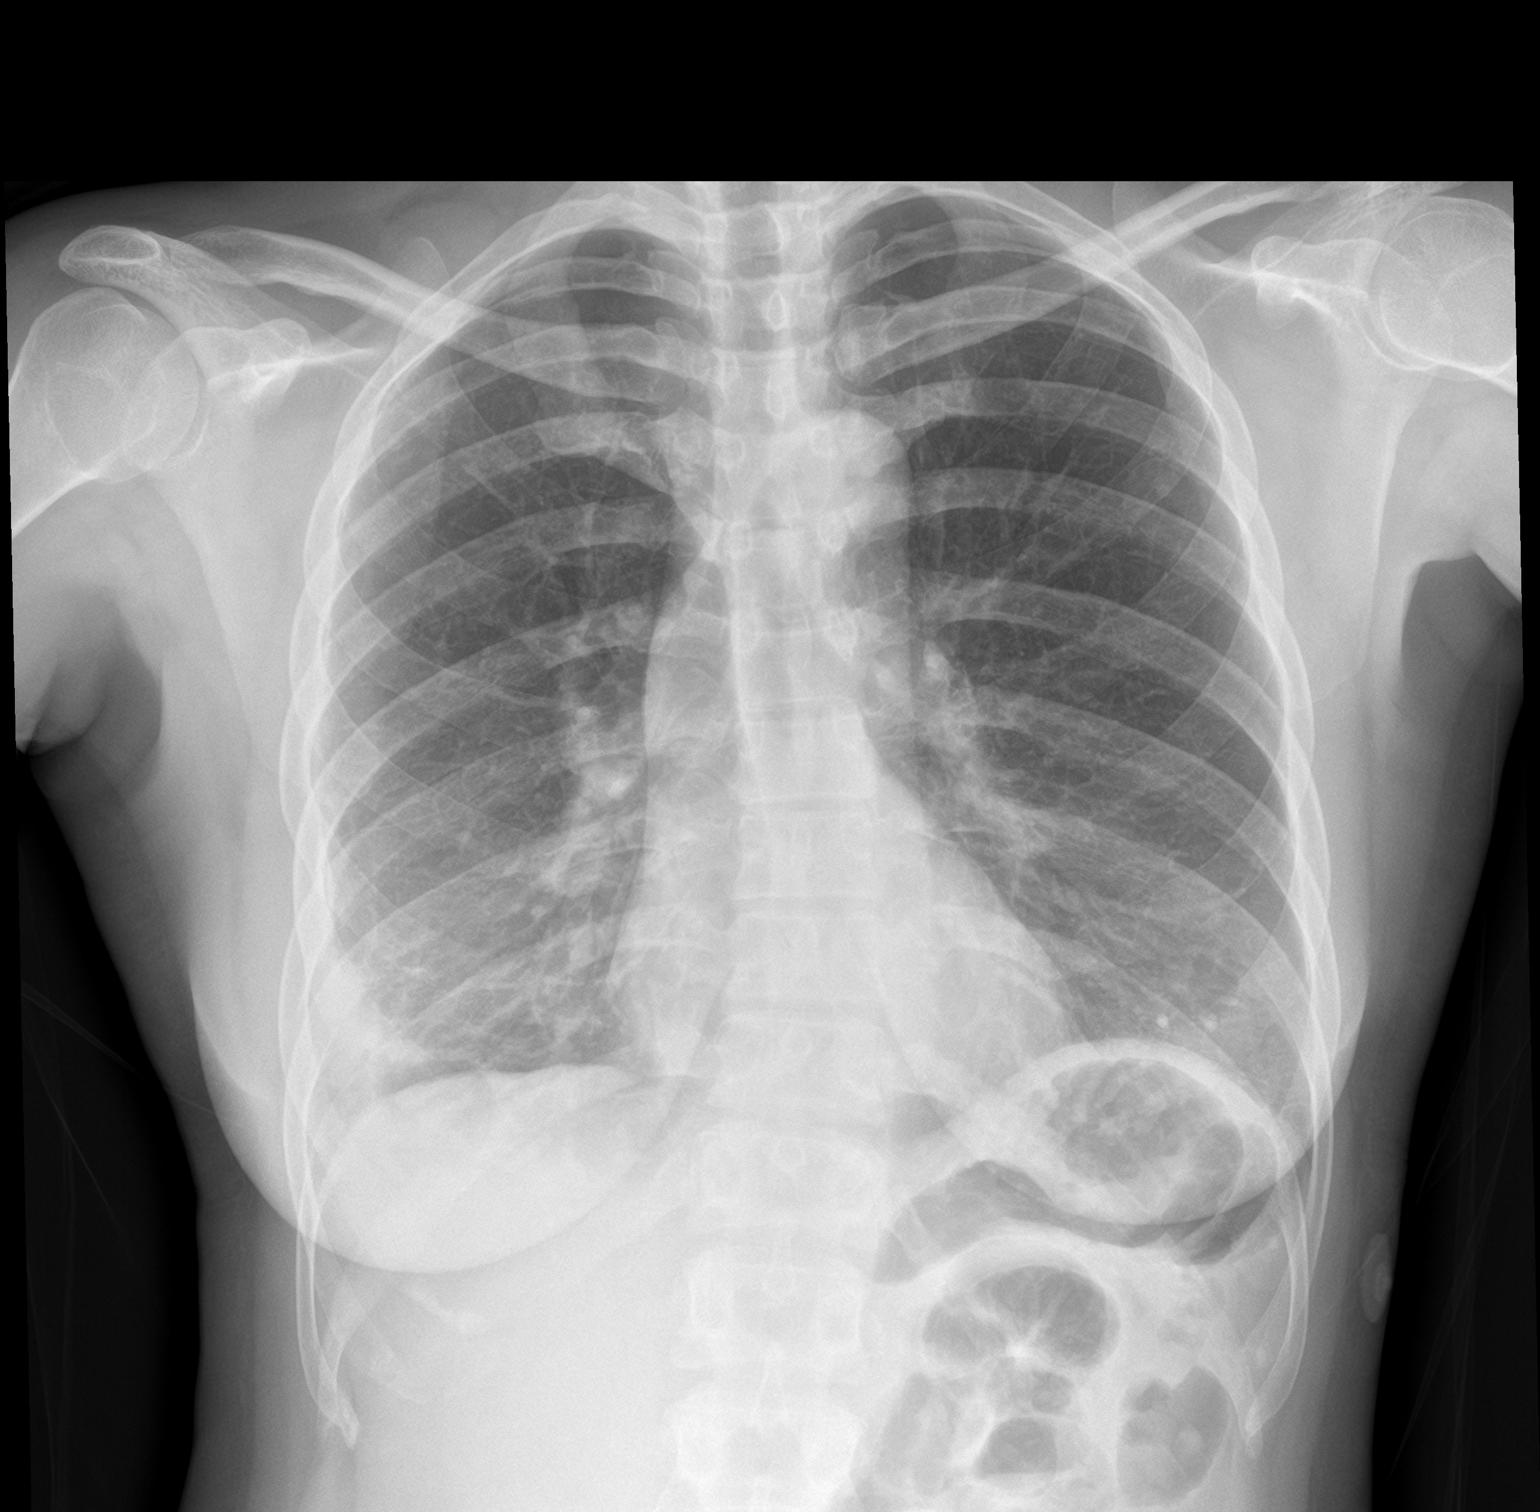

[chest lat]
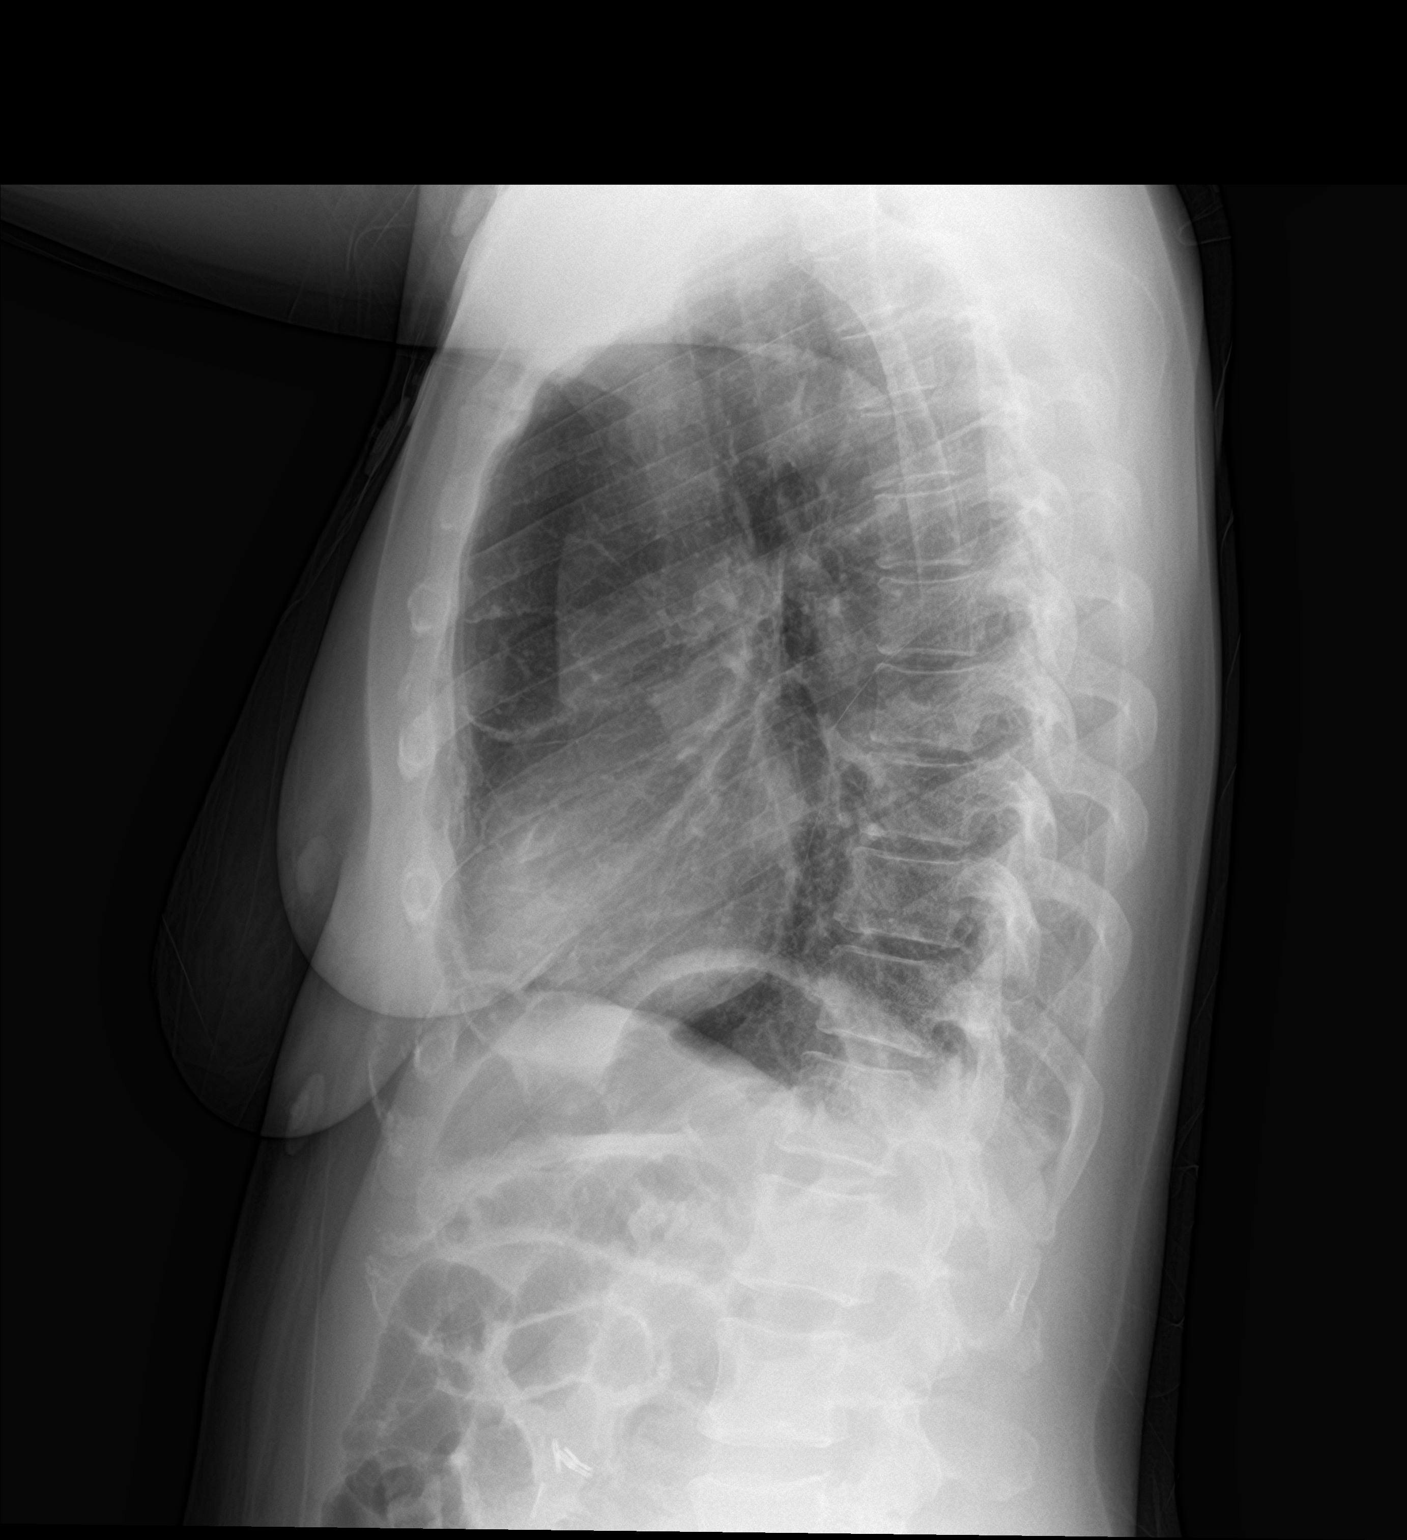

[2 of 2 positions shown; findings below may reference images not displayed]

FINDINGS: The heart size and mediastinal contours are within normal limits.
Heterogeneous airspace opacity of the peripheral right lung base.
The visualized skeletal structures are unremarkable.
IMPRESSION: Heterogeneous airspace opacity of the peripheral right lung base,
concerning for infection.

## 2019-11-12 MED ORDER — ONDANSETRON HCL 4 MG/2ML IJ SOLN
4.0000 mg | Freq: Four times a day (QID) | INTRAMUSCULAR | Status: DC | PRN
  Administered 2019-11-13 (×2): 4 mg via INTRAVENOUS
  Filled 2019-11-12 (×2): qty 2

## 2019-11-12 MED ORDER — ONDANSETRON HCL 4 MG/2ML IJ SOLN
INTRAMUSCULAR | Status: AC
  Administered 2019-11-12: 4 mg via INTRAVENOUS
  Filled 2019-11-12: qty 2

## 2019-11-12 MED ORDER — ONDANSETRON HCL 4 MG PO TABS: 4.0000 mg | ORAL_TABLET | Freq: Four times a day (QID) | ORAL | Status: DC | PRN

## 2019-11-12 MED ORDER — IOHEXOL 350 MG/ML SOLN
75.0000 mL | Freq: Once | INTRAVENOUS | Status: AC | PRN
  Administered 2019-11-12: 75 mL via INTRAVENOUS

## 2019-11-12 MED ORDER — SODIUM CHLORIDE 0.9 % IV BOLUS
1000.0000 mL | Freq: Once | INTRAVENOUS | Status: AC
  Administered 2019-11-12: 1000 mL via INTRAVENOUS

## 2019-11-12 MED ORDER — FENTANYL CITRATE (PF) 100 MCG/2ML IJ SOLN
50.0000 ug | Freq: Once | INTRAMUSCULAR | Status: AC
  Administered 2019-11-12: 50 ug via INTRAVENOUS
  Filled 2019-11-12: qty 2

## 2019-11-12 MED ORDER — MORPHINE SULFATE (PF) 4 MG/ML IV SOLN
4.0000 mg | Freq: Once | INTRAVENOUS | Status: AC
  Administered 2019-11-12: 4 mg via INTRAVENOUS
  Filled 2019-11-12 (×2): qty 1

## 2019-11-12 MED ORDER — ENOXAPARIN SODIUM 60 MG/0.6ML ~~LOC~~ SOLN
60.0000 mg | Freq: Two times a day (BID) | SUBCUTANEOUS | Status: DC
  Administered 2019-11-12 – 2019-11-13 (×2): 60 mg via SUBCUTANEOUS
  Filled 2019-11-12 (×2): qty 0.6

## 2019-11-12 MED ORDER — HYDROMORPHONE HCL 1 MG/ML IJ SOLN
0.5000 mg | INTRAMUSCULAR | Status: DC | PRN
  Administered 2019-11-12 – 2019-11-13 (×4): 0.5 mg via INTRAVENOUS
  Filled 2019-11-12 (×4): qty 1

## 2019-11-12 MED ORDER — ONDANSETRON HCL 4 MG/2ML IJ SOLN
4.0000 mg | Freq: Once | INTRAMUSCULAR | Status: AC
  Administered 2019-11-12: 4 mg via INTRAVENOUS
  Filled 2019-11-12: qty 2

## 2019-11-12 MED ORDER — ONDANSETRON HCL 4 MG/2ML IJ SOLN: 4.0000 mg | Freq: Four times a day (QID) | INTRAMUSCULAR | Status: DC | PRN

## 2019-11-12 MED ORDER — POLYETHYLENE GLYCOL 3350 17 G PO PACK: 17.0000 g | PACK | Freq: Every day | ORAL | Status: DC | PRN

## 2019-11-12 MED ORDER — ACETAMINOPHEN 325 MG PO TABS: 650.0000 mg | ORAL_TABLET | Freq: Four times a day (QID) | ORAL | Status: DC | PRN

## 2019-11-12 MED ORDER — ACETAMINOPHEN 650 MG RE SUPP: 650.0000 mg | Freq: Four times a day (QID) | RECTAL | Status: DC | PRN

## 2019-11-12 NOTE — Progress Notes (Signed)
ANTICOAGULATION CONSULT NOTE - Initial Consult  Pharmacy Consult for lovenox Indication: pulmonary embolus  Allergies  Allergen Reactions  . Demerol Hives and Itching    Patient Measurements: Height: 5' (152.4 cm) Weight: 61.2 kg (135 lb) IBW/kg (Calculated) : 45.5 Heparin Dosing Weight:  Vital Signs: Temp: 97.9 F (36.6 C) (10/17 1711) Temp Source: Oral (10/17 1711) BP: 124/95 (10/17 2015) Pulse Rate: 99 (10/17 2015)  Labs: Recent Labs    11/12/19 1215 11/12/19 1427  HGB 13.2  --   HCT 40.3  --   PLT 233  --   CREATININE 0.78  --   TROPONINIHS <2 <2    Estimated Creatinine Clearance: 72.6 mL/min (by C-G formula based on SCr of 0.78 mg/dL).   Medical History: Past Medical History:  Diagnosis Date  . Gallstones     Medications:  (Not in a hospital admission)   Assessment: Pharmacy consulted to dose lovenox in patient with pulmonary embolus, confirmed by CT.  Goal of Therapy:   Monitor platelets by anticoagulation protocol: Yes   Plan:  Lovenox 60 mg subq every 12 hours. Monitor labs and s/s of bleeding.  Salvatore Decent Samari Bittinger 11/12/2019,8:26 PM

## 2019-11-12 NOTE — ED Notes (Signed)
Dr E in to assess 

## 2019-11-12 NOTE — ED Notes (Signed)
covid swab to lab without label   Attempt to recollect with explaination to pt that she cannot be admitted without swab  She states she will not have it recollected as "it hurt"

## 2019-11-12 NOTE — ED Provider Notes (Signed)
[facility] EMERGENCY DEPARTMENT Provider Note  CSN: [id number] Arrival date & time: 11/12/19 1059    History Chief Complaint  Patient presents with  . Chest Pain    HPI  Kimberly Rocha is a 44 y.o. female with no significant PMH reports sudden onset of severe sharp R lower chest pain yesterday, she thought it might be GERD so she tried taking Nexium without improvement. Pain radiated into her R thoracic back and into her R shoulder so she took some OTC back pain medications without improvement. She reports pain is worse with deep breathing and cough. No fever. She recently travelled to [facility], but denies any leg swelling. She smokes but is not on OCPs, has had tubal ligation.    Past Medical History:  Diagnosis Date  . Gallstones     Past Surgical History:  Procedure Laterality Date  . CESAREAN SECTION    . CHOLECYSTECTOMY N/A 08/22/2012   Procedure: LAPAROSCOPIC CHOLECYSTECTOMY;  Surgeon: Dalia Heading, MD;  Location: [facility];  Service: General;  Laterality: N/A;  . HERNIA REPAIR    . TUBAL LIGATION      History reviewed. No pertinent family history.  Social History   Tobacco Use  . Smoking status: Current Every Day Smoker    Packs/day: 0.50    Types: Cigarettes  . Smokeless tobacco: Never Used  Substance Use Topics  . Alcohol use: Yes  . Drug use: No     Home Medications Prior to Admission medications   Medication Sig Start Date End Date Taking? Authorizing Provider  benzonatate (TESSALON) 200 MG capsule Take 1 capsule (200 mg total) by mouth 3 (three) times daily as needed for cough. 03/21/13   Burgess Amor, PA-C  celecoxib (CELEBREX) 100 MG capsule Take 1 capsule (100 mg total) by mouth 2 (two) times daily. 02/15/14   Ivery Quale, PA-C  dextromethorphan-guaiFENesin [facility] DM) 30-600 MG per 12 hr tablet Take 1 tablet by mouth 2 (two) times daily.    [provider]  DM-Doxylamine-Acetaminophen (NYQUIL COLD & FLU PO) Take 2 capsules by mouth  every 4 (four) hours as needed. Cold/flu sx    [provider]  HYDROcodone-acetaminophen (NORCO/VICODIN) 5-325 MG per tablet Take 1 tablet by mouth every 4 (four) hours as needed. 02/15/14   Ivery Quale, PA-C  ibuprofen (ADVIL,MOTRIN) 200 MG tablet Take 400 mg by mouth every 6 (six) hours as needed for pain.    [provider]  methocarbamol (ROBAXIN) 500 MG tablet Take 1 tablet (500 mg total) by mouth 3 (three) times daily. 02/15/14   Ivery Quale, PA-C  omeprazole (PRILOSEC) 20 MG capsule Take 1 capsule (20 mg total) by mouth daily. 02/15/15   Horton, Mayer Masker, MD     Allergies    Demerol   Review of Systems   Review of Systems A comprehensive review of systems was completed and negative except as noted in HPI.    Physical Exam BP (!) 158/143   Pulse 95   Temp 98.9 F (37.2 C) (Oral)   Resp (!) 34   Ht 5' (1.524 m)   Wt 61.2 kg   SpO2 100%   BMI 26.37 kg/m   Physical Exam Vitals and nursing note reviewed.  Constitutional:      Appearance: Normal appearance.  HENT:     Head: Normocephalic and atraumatic.     Nose: Nose normal.     Mouth/Throat:     Mouth: Mucous membranes are moist.  Eyes:  Extraocular Movements: Extraocular movements intact.     Conjunctiva/sclera: Conjunctivae normal.  Cardiovascular:     Rate and Rhythm: Normal rate.  Pulmonary:     Effort: Pulmonary effort is normal.     Breath sounds: Normal breath sounds.  Abdominal:     General: Abdomen is flat.     Palpations: Abdomen is soft.     Tenderness: There is no abdominal tenderness.  Musculoskeletal:        General: No swelling. Normal range of motion.     Cervical back: Neck supple.  Skin:    General: Skin is warm and dry.  Neurological:     General: No focal deficit present.     Mental Status: She is alert.  Psychiatric:        Mood and Affect: Mood normal.      ED Results / Procedures / Treatments   Labs (all labs ordered are listed, but only abnormal  results are displayed) Labs Reviewed  RESPIRATORY PANEL BY RT PCR (FLU A&B, COVID)  BASIC METABOLIC PANEL  CBC  POC URINE PREG, ED  TROPONIN I (HIGH SENSITIVITY)  TROPONIN I (HIGH SENSITIVITY)    EKG EKG Interpretation  Date/Time:  Sunday November 12 2019 11:33:32 EDT Ventricular Rate:  94 PR Interval:  158 QRS Duration: 66 QT Interval:  344 QTC Calculation: 430 R Axis:   82 Text Interpretation: Normal sinus rhythm Septal infarct , age undetermined Abnormal ECG No significant change since last tracing Confirmed by Susy Frizzle [phone]) on 11/12/2019 11:42:20 AM   Radiology DG Chest 2 View  Result Date: 11/12/2019 CLINICAL DATA:  Chest pain EXAM: CHEST - 2 VIEW COMPARISON:  02/23/2019 FINDINGS: The heart size and mediastinal contours are within normal limits. Heterogeneous airspace opacity of the peripheral right lung base. The visualized skeletal structures are unremarkable. IMPRESSION: Heterogeneous airspace opacity of the peripheral right lung base, concerning for infection. Electronically Signed   By: Lauralyn Primes M.D.   On: 11/12/2019 12:12    Procedures Procedures  Medications Ordered in the ED Medications  sodium chloride 0.9 % bolus 1,000 mL (has no administration in time range)  fentaNYL (SUBLIMAZE) injection 50 mcg (has no administration in time range)     MDM Rules/Calculators/A&P MDM CXR reviewed, RLL infiltrate vs wedge infarct. Given recent travel, no fever or leukocytosis will send for CTA PE study. IVF and pain meds in the meantime.  ED Course  I have reviewed the triage vital signs and the nursing notes.  Pertinent labs & imaging results that were available during my care of the patient were reviewed by me and considered in my medical decision making (see chart for details).  Clinical Course as of Nov 12 733  Wynelle Link Nov 12, 2019  1456 Care of the patient signed out to Dr. Criss Alvine at the change of shift pending CTA.    [CS]    Clinical Course User  Index [CS] Pollyann Savoy, MD    Final Clinical Impression(s) / ED Diagnoses Final diagnoses:  None    Rx / DC Orders ED Discharge Orders    None       Pollyann Savoy, MD 11/13/19 [phone]

## 2019-11-12 NOTE — ED Notes (Signed)
Pt c/o burning to IV site; site checked and no swelling noted; blood return noted and IV flushed without difficulty; pt given heat pack for pain

## 2019-11-12 NOTE — ED Notes (Signed)
IV to L AC unsuccessful   Lab drawn from site but IV would not advance   Pt demands it be withdrawn  SWOT called due to pt need for large bore IV for CT angio and pt demand that "best" start her

## 2019-11-12 NOTE — ED Notes (Signed)
No covid vaccines "and I don't plan to get one"  Reports pain yesterday like "my acid reflux" then progressed to back and chest pain worse with movement and deep breaths  Here for eval

## 2019-11-12 NOTE — ED Notes (Signed)
Pt and daughter are upset that there are no beds inpatient in spite of request of [facility] for hospital bed  Review plan of care w patient after she asked why she is not getting heparin- She has received lovenox   Pt reports she wants to leave AMA  Dr E notified and reports pt desires to leave and will need to sign out AMA  Pt then decided to remain

## 2019-11-12 NOTE — ED Triage Notes (Signed)
Pt to er, pt states that yesterday afternoon when she was sitting on the side of the bed she had a sudden onset of back and chest pain that is worse when she takes a deep breath.  States that she had a similar pain before and it was acid reflux, but that was in the center of her chest and this is on the R side.

## 2019-11-12 NOTE — H&P (Addendum)
History and Physical    Kimberly Rocha VVO:[id number] DOB: 1975-12-19 DOA: 11/12/2019  PCP: Benita Stabile, MD   Patient coming from: Home  I have personally briefly reviewed patient's old medical records in [facility]  Chief Complaint: Chest Pain  HPI: Kimberly Rocha is a 44 y.o. female with medical history significant for  Anxiety, GERD, tobacco abuse. Patient presented to the ED with complaints of sudden onset of severe right-sided lower chest pain radiating to the back. Chest pain is worse with deep breathing and cough. Chest pain associated with difficulty breathing. Traveled to [facility] on the 8th of this month October, spent the weekend there and travelled back on Sunday the 10th. She flew back and forth, 3-1/2 h straight flight both ways, no layovers. She denies lower extremity swelling redness or pain. She reports she travels frequently, flies a lot and takes even longer trips and has not had this problem in the past. No personal family history of blood clots in lungs or legs. She smokes half a pack of cigarettes daily but is trying to quit currently vaping. She has not been vaccinated for Covid. Not on any hormonal supplements, she had her tubes tied. She denies history of blood in stools vomiting of blood or ulcers.  ED Course: Heart rate 70s to 90s, temperature 98.9. Blood pressure 120s to 160s. O2 sats greater than 95% on room air. Troponin II. Unremarkable CBC, BMP. EKG shows sinus rhythm no significant abnormalities. CTA chest-segmental to subsegmental embolus present to the right lower lobe and to the right upper lobe. Wedge-shaped opacity right lower lobe consistent with pulmonary infarction. Hospitalist to admit for further evaluation and management.  Review of Systems: As per HPI all other systems reviewed and negative.  Past Medical History:  Diagnosis Date  . Gallstones     Past Surgical History:  Procedure Laterality Date  . CESAREAN SECTION    .  CHOLECYSTECTOMY N/A 08/22/2012   Procedure: LAPAROSCOPIC CHOLECYSTECTOMY;  Surgeon: Dalia Heading, MD;  Location: [facility];  Service: General;  Laterality: N/A;  . HERNIA REPAIR    . TUBAL LIGATION       reports that she has been smoking cigarettes. She has been smoking about 0.50 packs per day. She has never used smokeless tobacco. She reports current alcohol use. She reports that she does not use drugs.  Allergies  Allergen Reactions  . Demerol Hives and Itching    No family history of blood clots.  Prior to Admission medications   Medication Sig Start Date End Date Taking? Authorizing Provider  benzonatate (TESSALON) 200 MG capsule Take 1 capsule (200 mg total) by mouth 3 (three) times daily as needed for cough. 03/21/13   Burgess Amor, PA-C  celecoxib (CELEBREX) 100 MG capsule Take 1 capsule (100 mg total) by mouth 2 (two) times daily. 02/15/14   Ivery Quale, PA-C  dextromethorphan-guaiFENesin [facility] DM) 30-600 MG per 12 hr tablet Take 1 tablet by mouth 2 (two) times daily.    [provider]  DM-Doxylamine-Acetaminophen (NYQUIL COLD & FLU PO) Take 2 capsules by mouth every 4 (four) hours as needed. Cold/flu sx    [provider]  HYDROcodone-acetaminophen (NORCO/VICODIN) 5-325 MG per tablet Take 1 tablet by mouth every 4 (four) hours as needed. 02/15/14   Ivery Quale, PA-C  ibuprofen (ADVIL,MOTRIN) 200 MG tablet Take 400 mg by mouth every 6 (six) hours as needed for pain.    [provider]  methocarbamol (ROBAXIN) 500 MG  tablet Take 1 tablet (500 mg total) by mouth 3 (three) times daily. 02/15/14   Ivery Quale, PA-C  omeprazole (PRILOSEC) 20 MG capsule Take 1 capsule (20 mg total) by mouth daily. 02/15/15   Shon Baton, MD    Physical Exam: Vitals:   11/12/19 1135 11/12/19 1409 11/12/19 1430 11/12/19 1500  BP:  (!) 158/143  (!) 129/93  Pulse:   96 (!) 105  Resp:  (!) 34 (!) 28 (!) 28  Temp:      TempSrc:      SpO2:   99% 94%  Weight: 61.2  kg     Height: 5' (1.524 m)       Constitutional: NAD, calm, comfortable Vitals:   11/12/19 1135 11/12/19 1409 11/12/19 1430 11/12/19 1500  BP:  (!) 158/143  (!) 129/93  Pulse:   96 (!) 105  Resp:  (!) 34 (!) 28 (!) 28  Temp:      TempSrc:      SpO2:   99% 94%  Weight: 61.2 kg     Height: 5' (1.524 m)      Eyes: PERRL, lids and conjunctivae normal ENMT: Mucous membranes are moist.  Neck: normal, supple, no masses, no thyromegaly Respiratory: clear to auscultation bilaterally, no wheezing, no crackles. Normal respiratory effort. No accessory muscle use.  Cardiovascular: Regular rate and rhythm, no murmurs / rubs / gallops. No extremity edema. 2+ pedal pulses.  Abdomen: no tenderness, no masses palpated. No hepatosplenomegaly. Bowel sounds positive.  Musculoskeletal: no clubbing / cyanosis. No joint deformity upper and lower extremities. Good ROM, no contractures. Normal muscle tone.  Skin: no rashes, lesions, ulcers. No induration Neurologic: No apparent cranial nerve abnormality, moving extremities spontaneously. Psychiatric: Normal judgment and insight. Alert and oriented x 3. Normal mood.   Labs on Admission: I have personally reviewed following labs and imaging studies  CBC: Recent Labs  Lab 11/12/19 1215  WBC 10.5  HGB 13.2  HCT 40.3  MCV 100.0  PLT 233   Basic Metabolic Panel: Recent Labs  Lab 11/12/19 1215  NA 138  K 3.7  CL 106  CO2 25  GLUCOSE 98  BUN 7  CREATININE 0.78  CALCIUM 9.2   Urine analysis:    Component Value Date/Time   COLORURINE YELLOW 10/17/2018 1532   APPEARANCEUR HAZY (A) 10/17/2018 1532   LABSPEC 1.024 10/17/2018 1532   PHURINE 5.0 10/17/2018 1532   GLUCOSEU NEGATIVE 10/17/2018 1532   HGBUR SMALL (A) 10/17/2018 1532   BILIRUBINUR NEGATIVE 10/17/2018 1532   KETONESUR NEGATIVE 10/17/2018 1532   PROTEINUR NEGATIVE 10/17/2018 1532   UROBILINOGEN 0.2 07/23/2012 0825   NITRITE POSITIVE (A) 10/17/2018 1532   LEUKOCYTESUR NEGATIVE  10/17/2018 1532    Radiological Exams on Admission: DG Chest 2 View  Result Date: 11/12/2019 CLINICAL DATA:  Chest pain EXAM: CHEST - 2 VIEW COMPARISON:  02/23/2019 FINDINGS: The heart size and mediastinal contours are within normal limits. Heterogeneous airspace opacity of the peripheral right lung base. The visualized skeletal structures are unremarkable. IMPRESSION: Heterogeneous airspace opacity of the peripheral right lung base, concerning for infection. Electronically Signed   By: Lauralyn Primes M.D.   On: 11/12/2019 12:12   CT Angio Chest PE W/Cm &/Or Wo Cm  Result Date: 11/12/2019 CLINICAL DATA:  Sudden sharp back and chest pain, worse with inspiration EXAM: CT ANGIOGRAPHY CHEST WITH CONTRAST TECHNIQUE: Multidetector CT imaging of the chest was performed using the standard protocol during bolus administration of intravenous contrast. Multiplanar CT image reconstructions  and MIPs were obtained to evaluate the vascular anatomy. CONTRAST:  61mL OMNIPAQUE IOHEXOL 350 MG/ML SOLN COMPARISON:  Same day chest radiograph FINDINGS: Cardiovascular: Satisfactory opacification of the pulmonary arteries to the segmental level. Positive examination for pulmonary embolism with segmental to subsegmental embolus present in right lower lobe, as well as a anterior right upper lobe (series 5, image 152, 127). No left-sided embolus identified. Normal heart size. RV LV ratio is preserved at approximately 0.8. No pericardial effusion. Mediastinum/Nodes: No enlarged mediastinal, hilar, or axillary lymph nodes. Thyroid gland, trachea, and esophagus demonstrate no significant findings. Lungs/Pleura: Trace right pleural effusion. There is a wedge-shaped subpleural ground-glass opacity of the lateral segment right lower lobe with central clearing (series 6, image 93). Mild centrilobular emphysema. Upper Abdomen: No acute abnormality. Musculoskeletal: No chest wall abnormality. No acute or significant osseous findings. Review  of the MIP images confirms the above findings. IMPRESSION: 1. Positive examination for pulmonary embolism with segmental to subsegmental embolus present in the right lower lobe, as well as the anterior right upper lobe. 2. There is a wedge-shaped subpleural ground-glass opacity of the lateral segment right lower lobe with central clearing, located distal to embolus and consistent with pulmonary infarction. 3. Trace right pleural effusion. 4. Emphysema (ICD10-J43.9). These results were called by telephone at the time of interpretation on 11/12/2019 at 3:51 pm to Dr. Criss Alvine, who verbally acknowledged these results. Electronically Signed   By: Lauralyn Primes M.D.   On: 11/12/2019 15:52    EKG: Independently reviewed. Sinus rhythm rate 94. QTc 430. No significant ST or T wave changes compared to prior EKG.  Assessment/Plan Active Problems:   Pulmonary embolism (HCC)  Likely provoked pulmonary embolism-chest pain, dyspnea. Heart rate 80s. Recent travel 7-hour round flight to [facility] 1 week ago. No lower extremity redness swelling or pain. O2 sats greater than ~94% on room air. Current history of tobacco abuse. CTA chest shows segmental to subsegmental PE in right lower and upper lobe. Not on hormonal supplements. -IV Lovenox, pharmacy to dose -Obtain echocardiogram. -Obtain bilateral lower extremity Dopplers -Persistent chest pain after fentanyl and morphine, will use IV Dilaudid 0.5 q4h prn. - 1L N/s bolus given  Anxiety/depression - med rec pending, she tells me she takes hydroxyzine, Remeron daily  Cold Sores - Med rec pending, she tells me she takes Acyclovir daily  Tobacco abuse- 1/2 PPD, reports she is trying to quit, currently vaping. -Counseled to quit   DVT prophylaxis: Lovenox Code Status: Full code Family Communication: None at bedside Disposition Plan:  ~ 1 day Consults called: None Admission status: Observation, telemetry.   Onnie Boer MD [facility]  11/12/2019, 4:57 PM

## 2019-11-12 NOTE — ED Provider Notes (Signed)
Care transferred to me.  CT confirms pulmonary embolus though it is small but causing infarction.  Because of this and because of poor pain control, I will give her IV morphine and admit her to hospital service for observation.  Dr. Mariea Clonts will decide on which type of anticoagulation.   Pricilla Loveless, MD 11/12/19 [phone]

## 2019-11-13 ENCOUNTER — Observation Stay (HOSPITAL_BASED_OUTPATIENT_CLINIC_OR_DEPARTMENT_OTHER): Payer: Self-pay

## 2019-11-13 ENCOUNTER — Observation Stay (HOSPITAL_COMMUNITY): Payer: Self-pay

## 2019-11-13 DIAGNOSIS — I2693 Single subsegmental pulmonary embolism without acute cor pulmonale: Secondary | ICD-10-CM

## 2019-11-13 DIAGNOSIS — I2699 Other pulmonary embolism without acute cor pulmonale: Secondary | ICD-10-CM | POA: Diagnosis present

## 2019-11-13 DIAGNOSIS — Z720 Tobacco use: Secondary | ICD-10-CM

## 2019-11-13 LAB — CBC
HCT: 35.4 % — ABNORMAL LOW (ref 36.0–46.0)
Hemoglobin: 11.4 g/dL — ABNORMAL LOW (ref 12.0–15.0)
MCH: 32.1 pg (ref 26.0–34.0)
MCHC: 32.2 g/dL (ref 30.0–36.0)
MCV: 99.7 fL (ref 80.0–100.0)
Platelets: 233 10*3/uL (ref 150–400)
RBC: 3.55 MIL/uL — ABNORMAL LOW (ref 3.87–5.11)
RDW: 13.5 % (ref 11.5–15.5)
WBC: 11.1 10*3/uL — ABNORMAL HIGH (ref 4.0–10.5)
nRBC: 0 % (ref 0.0–0.2)

## 2019-11-13 LAB — ECHOCARDIOGRAM COMPLETE
AR max vel: 1.86 cm2
AV Area VTI: 1.67 cm2
AV Area mean vel: 1.72 cm2
AV Mean grad: 4 mmHg
AV Peak grad: 8.6 mmHg
Ao pk vel: 1.47 m/s
Area-P 1/2: 4.21 cm2
Height: 60 in
S' Lateral: 2.42 cm
Weight: 2160 oz

## 2019-11-13 LAB — HIV ANTIBODY (ROUTINE TESTING W REFLEX): HIV Screen 4th Generation wRfx: NONREACTIVE

## 2019-11-13 IMAGING — US US EXTREM LOW VENOUS
1 series · 13 of 24 positions shown · non-contrast
Comparison: None.

CLINICAL DATA: Pulmonary embolism



[Series 1: us extrem low venous · 0.06mm/px · 13 of 84 slices shown]
[im 1/84]
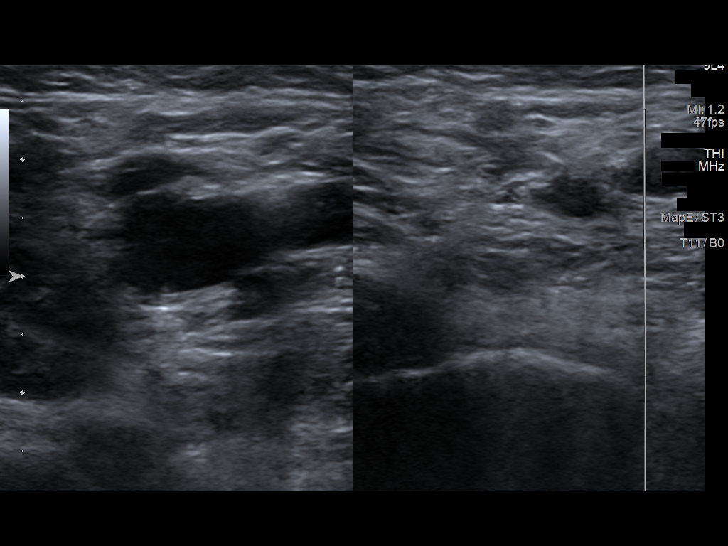
[im 8/84]
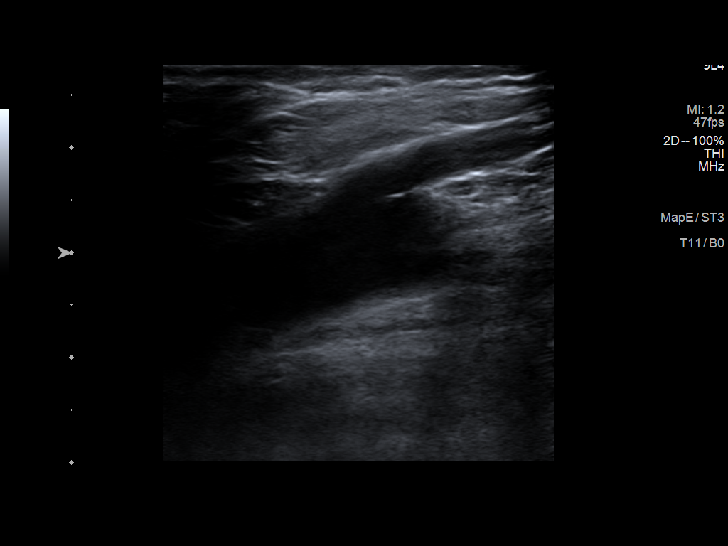
[im 15/84]
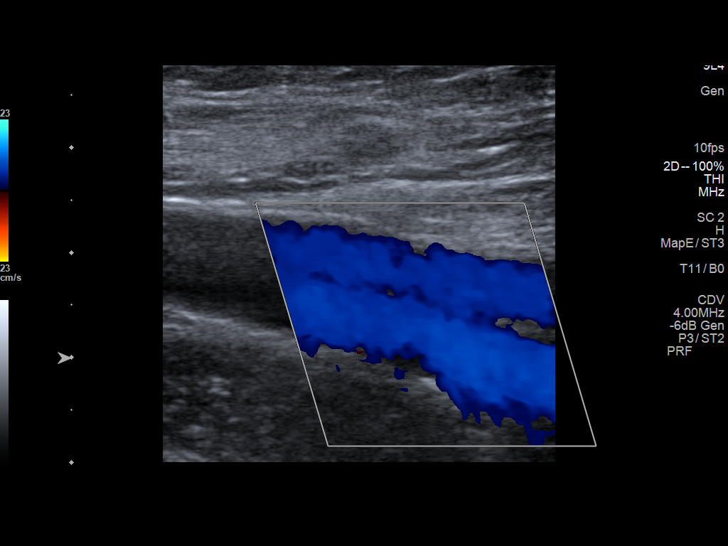
[im 22/84]
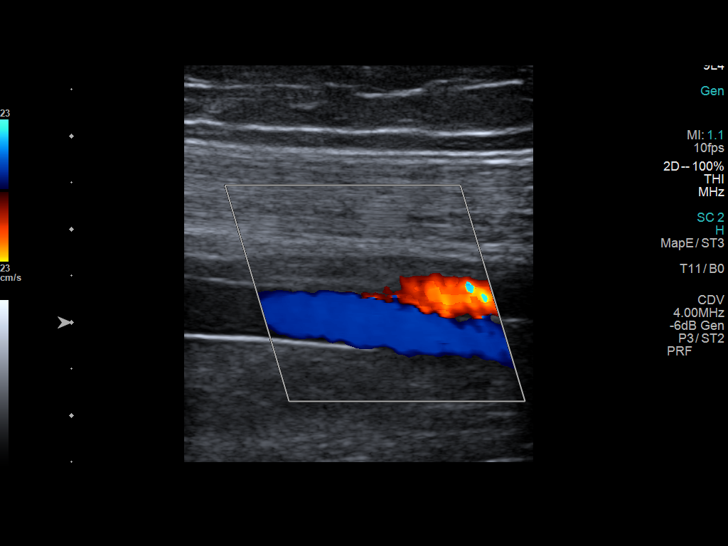
[im 29/84]
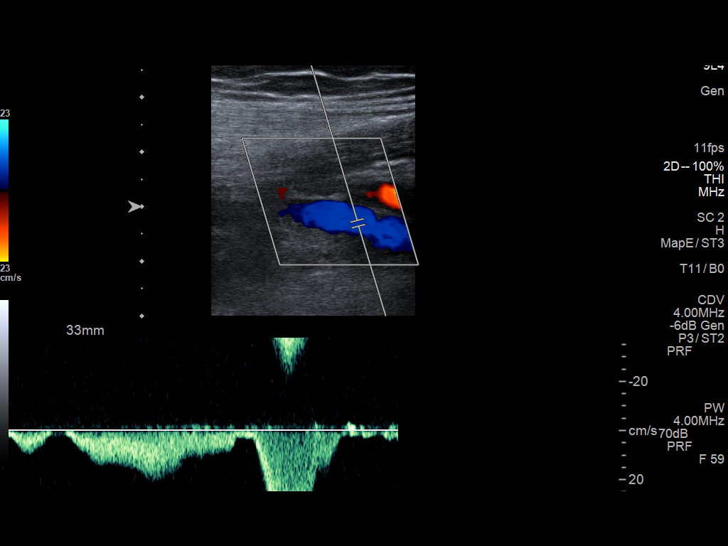
[im 37/84]
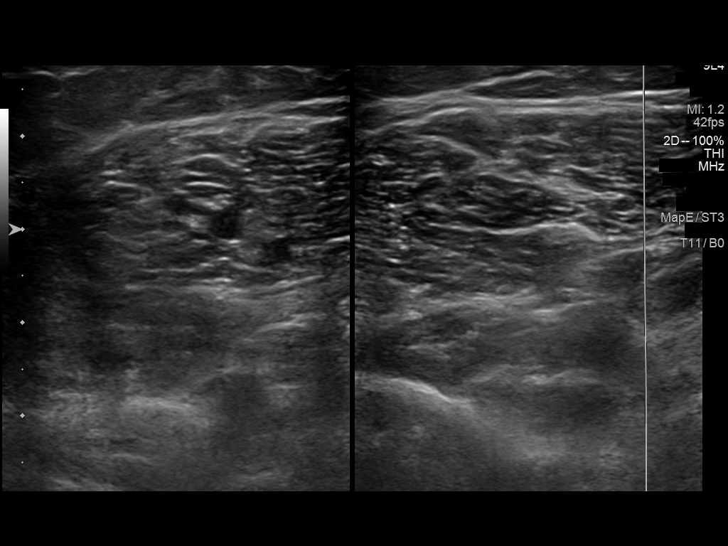
[im 44/84]
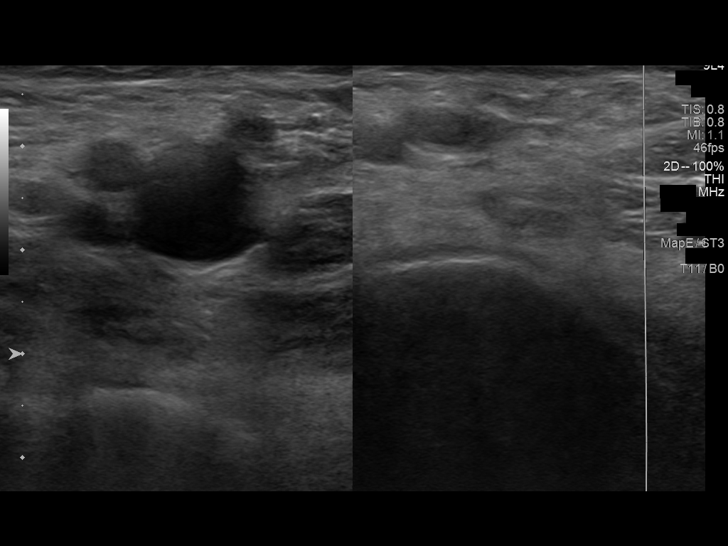
[im 47/84]
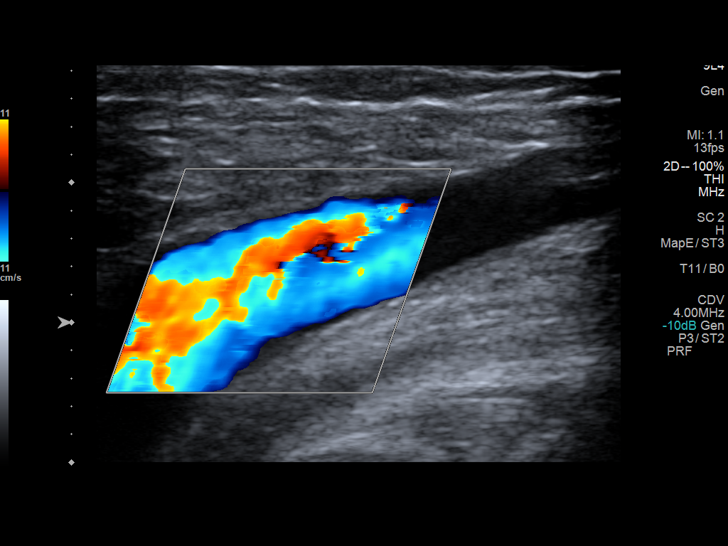
[im 55/84]
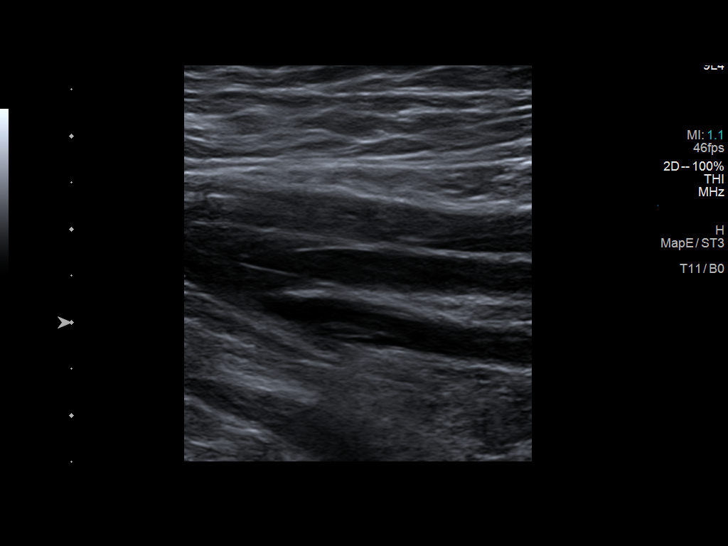
[im 62/84]
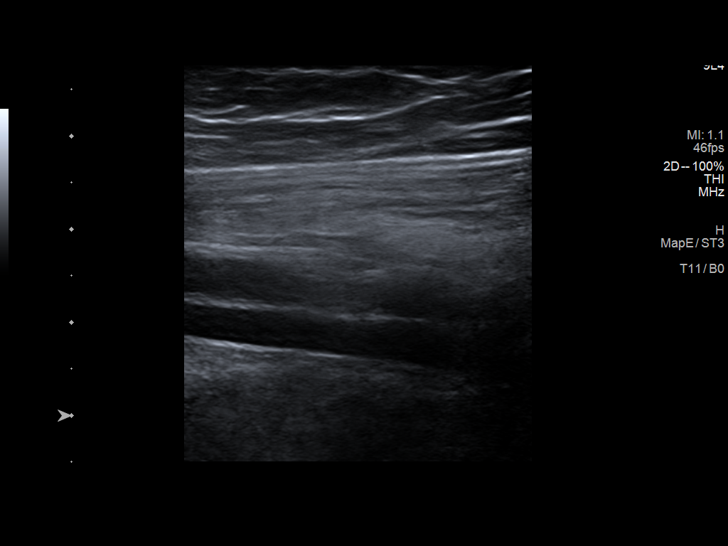
[im 69/84]
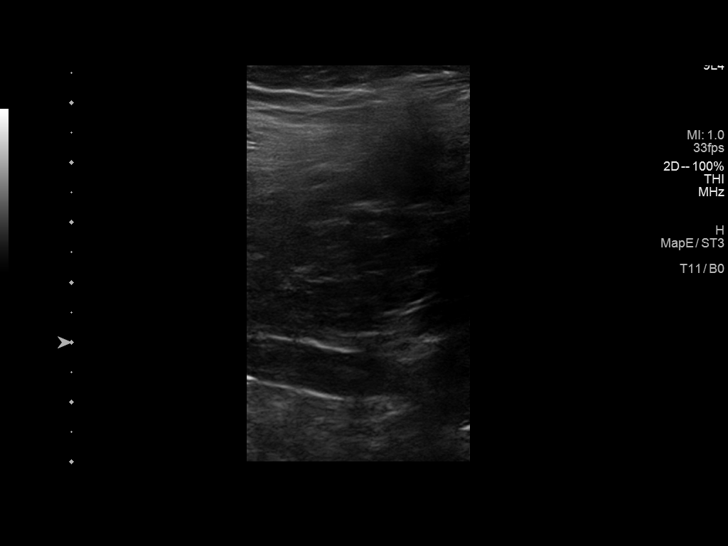
[im 76/84]
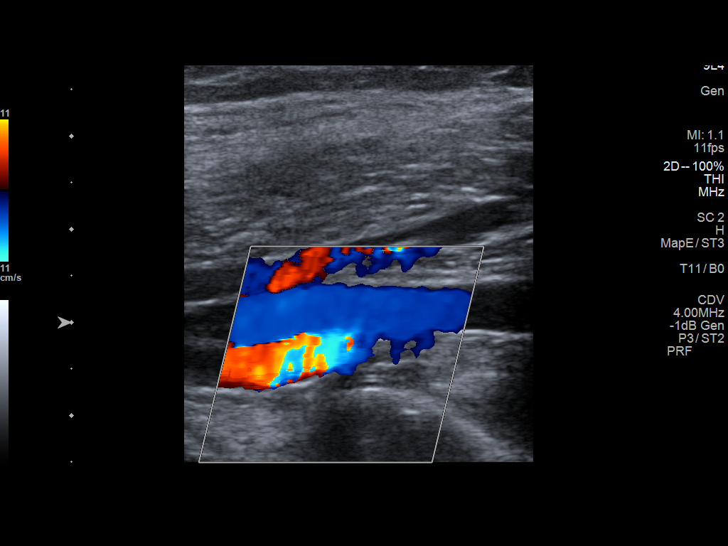
[im 84/84]
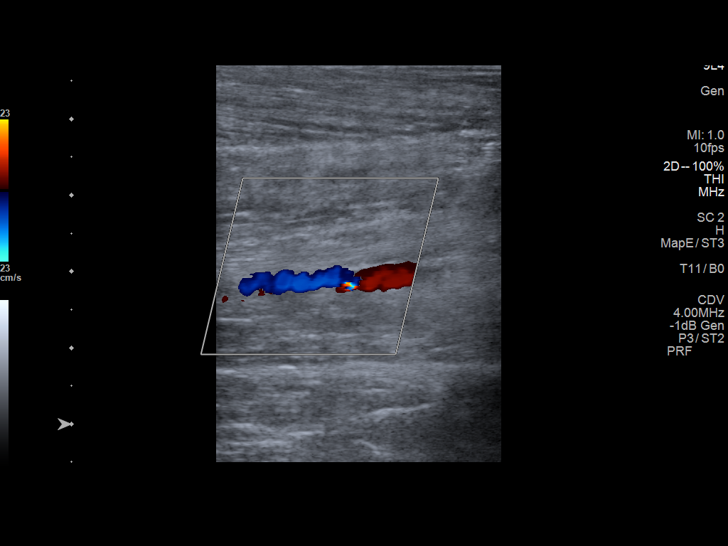

[13 of 24 positions shown; findings below may reference images not displayed]

FINDINGS: RIGHT LOWER EXTREMITY

Common Femoral Vein: No evidence of thrombus. Normal
compressibility, respiratory phasicity and response to augmentation.

Saphenofemoral Junction: No evidence of thrombus. Normal
compressibility and flow on color Doppler imaging.

Profunda Femoral Vein: No evidence of thrombus. Normal
compressibility and flow on color Doppler imaging.

Femoral Vein: No evidence of thrombus. Normal compressibility,
respiratory phasicity and response to augmentation.

Popliteal Vein: No evidence of thrombus. Normal compressibility,
respiratory phasicity and response to augmentation.

Calf Veins: No evidence of thrombus. Normal compressibility and flow
on color Doppler imaging.

Superficial Great Saphenous Vein: No evidence of thrombus. Normal
compressibility.

Venous Reflux:  None.

Other Findings:  None.

LEFT LOWER EXTREMITY

Common Femoral Vein: No evidence of thrombus. Normal
compressibility, respiratory phasicity and response to augmentation.

Saphenofemoral Junction: No evidence of thrombus. Normal
compressibility and flow on color Doppler imaging.

Profunda Femoral Vein: No evidence of thrombus. Normal
compressibility and flow on color Doppler imaging.

Femoral Vein: No evidence of thrombus. Normal compressibility,
respiratory phasicity and response to augmentation.

Popliteal Vein: No evidence of thrombus. Normal compressibility,
respiratory phasicity and response to augmentation.

Calf Veins: No evidence of thrombus. Normal compressibility and flow
on color Doppler imaging.

Superficial Great Saphenous Vein: No evidence of thrombus. Normal
compressibility.

Venous Reflux:  None.

Other Findings:  None.
IMPRESSION: No evidence of deep venous thrombosis in either lower extremity.

## 2019-11-13 MED ORDER — APIXABAN (ELIQUIS) VTE STARTER PACK (10MG AND 5MG): ORAL_TABLET | ORAL | 0 refills | Status: DC

## 2019-11-13 MED ORDER — TRAMADOL HCL 50 MG PO TABS
50.0000 mg | ORAL_TABLET | Freq: Four times a day (QID) | ORAL | Status: DC | PRN
  Administered 2019-11-13: 50 mg via ORAL
  Filled 2019-11-13: qty 1

## 2019-11-13 MED ORDER — TRAMADOL HCL 50 MG PO TABS: 50.0000 mg | ORAL_TABLET | Freq: Four times a day (QID) | ORAL | 0 refills | Status: AC | PRN

## 2019-11-13 MED ORDER — APIXABAN 5 MG PO TABS: 5.0000 mg | ORAL_TABLET | Freq: Two times a day (BID) | ORAL | Status: DC

## 2019-11-13 MED ORDER — APIXABAN 5 MG PO TABS: 10.0000 mg | ORAL_TABLET | Freq: Two times a day (BID) | ORAL | Status: DC

## 2019-11-13 MED ORDER — NICOTINE 14 MG/24HR TD PT24: 14.0000 mg | MEDICATED_PATCH | TRANSDERMAL | 0 refills | Status: DC

## 2019-11-13 MED ORDER — INFLUENZA VAC SPLIT QUAD 0.5 ML IM SUSY: 0.5000 mL | PREFILLED_SYRINGE | INTRAMUSCULAR | Status: DC

## 2019-11-13 NOTE — Progress Notes (Signed)
SATURATION QUALIFICATIONS:   Patient Saturations on Room Air at Rest = 100%  Patient Saturations on Room Air while Ambulating = 96%  Pt ambulated > 500 ft without difficulty or SOB.

## 2019-11-13 NOTE — Progress Notes (Signed)
*  PRELIMINARY RESULTS* Echocardiogram 2D Echocardiogram has been performed.  Kimberly Rocha 11/13/2019, 9:13 AM

## 2019-11-13 NOTE — ED Notes (Signed)
Pt was given a hot pack at this time for her low back pain Kimberly Rocha

## 2019-11-13 NOTE — Progress Notes (Signed)
Nsg Discharge Note  Admit Date:  11/12/2019 Discharge date: 11/13/2019   [name] to be D/C'd Home per MD order.  AVS completed.  Patient able to verbalize understanding.  Discharge Medication: Allergies as of 11/13/2019      Reactions   Demerol Hives, Itching      Medication List    STOP taking these medications   ibuprofen 200 MG tablet Commonly known as: ADVIL     TAKE these medications   acyclovir 400 MG tablet Commonly known as: ZOVIRAX Take 400 mg by mouth 2 (two) times daily.   Apixaban Starter Pack (10mg  and 5mg ) Commonly known as: ELIQUIS STARTER PACK Take as directed on package: start with two-5mg  tablets twice daily for 7 days. On day 8, switch to one-5mg  tablet twice daily.   hydrOXYzine 50 MG capsule Commonly known as: VISTARIL Take 50 mg by mouth 3 (three) times daily as needed.   mirtazapine 45 MG tablet Commonly known as: REMERON Take 45 mg by mouth at bedtime.   omeprazole 20 MG capsule Commonly known as: PRILOSEC Take 1 capsule (20 mg total) by mouth daily.   traMADol 50 MG tablet Commonly known as: ULTRAM Take 1 tablet (50 mg total) by mouth every 6 (six) hours as needed for up to 5 days for severe pain.       Discharge Assessment: Vitals:   11/13/19 0941 11/13/19 1051  BP:  (!) 138/98  Pulse:  93  Resp:  18  Temp: 97.7 F (36.5 C)   SpO2:  95%   Skin clean, dry and intact without evidence of skin break down, no evidence of skin tears noted. IV catheter discontinued intact. Site without signs and symptoms of complications - no redness or edema noted at insertion site, patient denies c/o pain - only slight tenderness at site.  Dressing with slight pressure applied.  D/c Instructions-Education: Discharge instructions given to patient with verbalized understanding. D/c education completed with patient including follow up instructions, medication list, d/c activities limitations if indicated, with other d/c instructions as indicated  by MD - patient able to verbalize understanding, all questions fully answered. Patient instructed to return to ED, call 911, or call MD for any changes in condition.  Patient escorted via WC, and D/C home via private auto.  Emori Kamau 11/15/19, RN 11/13/2019 1:56 PM

## 2019-11-13 NOTE — TOC Progression Note (Signed)
Transition of Care [facility]) - Progression Note    Patient Details  Name: Kimberly Rocha MRN: [id number] Date of Birth: [birth year]  Transition of Care [facility]) CM/SW Contact  Karn Cassis, [place] Phone Number: 11/13/2019, 8:39 AM  Clinical Narrative:  TOC noted pt has no insurance. LCSW spoke with pt who was agreeable to referral to financial counselor. LCSW referred to [facility] to see if eligible for Medicaid. TOC to follow.           Expected Discharge Plan and Services                                                 Social Determinants of Health (SDOH) Interventions    Readmission Risk Interventions No flowsheet data found.

## 2019-11-13 NOTE — Discharge Instructions (Signed)
Pulmonary Embolism  A pulmonary embolism (PE) is a sudden blockage or decrease of blood flow in one or both lungs. Most blockages come from a blood clot that forms in the vein of a lower leg, thigh, or arm (deep vein thrombosis, DVT) and travels to the lungs. A clot is blood that has thickened into a gel or solid. PE is a dangerous and life-threatening condition that needs to be treated right away. What are the causes? This condition is usually caused by a blood clot that forms in a vein and moves to the lungs. In rare cases, it may be caused by air, fat, part of a tumor, or other tissue that moves through the veins and into the lungs. What increases the risk? The following factors may make you more likely to develop this condition:  Experiencing a traumatic injury, such as breaking a hip or leg.  Having: ? A spinal cord injury. ? Orthopedic surgery, especially hip or knee replacement. ? Any major surgery. ? A stroke. ? DVT. ? Blood clots or blood clotting disease. ? Long-term (chronic) lung or heart disease. ? Cancer treated with chemotherapy. ? A central venous catheter.  Taking medicines that contain estrogen. These include birth control pills and hormone replacement therapy.  Being: ? Pregnant. ? In the period of time after your baby is delivered (postpartum). ? Older than age 35. ? Overweight. ? A smoker, especially if you have other risks. What are the signs or symptoms? Symptoms of this condition usually start suddenly and include:  Shortness of breath during activity or at rest.  Coughing, coughing up blood, or coughing up blood-tinged mucus.  Chest pain that is often worse with deep breaths.  Rapid or irregular heartbeat.  Feeling light-headed or dizzy.  Fainting.  Feeling anxious.  Fever.  Sweating.  Pain and swelling in a leg. This is a symptom of DVT, which can lead to PE. How is this diagnosed? This condition may be diagnosed based on:  Your  medical history.  A physical exam.  Blood tests.  CT pulmonary angiogram. This test checks blood flow in and around your lungs.  Ventilation-perfusion scan, also called a lung VQ scan. This test measures air flow and blood flow to the lungs.  An ultrasound of the legs. How is this treated? Treatment for this condition depends on many factors, such as the cause of your PE, your risk for bleeding or developing more clots, and other medical conditions you have. Treatment aims to remove, dissolve, or stop blood clots from forming or growing larger. Treatment may include:  Medicines, such as: ? Blood thinning medicines (anticoagulants) to stop clots from forming. ? Medicines that dissolve clots (thrombolytics).  Procedures, such as: ? Using a flexible tube to remove a blood clot (embolectomy) or to deliver medicine to destroy it (catheter-directed thrombolysis). ? Inserting a filter into a large vein that carries blood to the heart (inferior vena cava). This filter (vena cava filter) catches blood clots before they reach the lungs. ? Surgery to remove the clot (surgical embolectomy). This is rare. You may need a combination of immediate, long-term (up to 3 months after diagnosis), and extended (more than 3 months after diagnosis) treatments. Your treatment may continue for several months (maintenance therapy). You and your health care provider will work together to choose the treatment program that is best for you. Follow these instructions at home: Medicines  Take over-the-counter and prescription medicines only as told by your health care provider.  If  you are taking an anticoagulant medicine: ? Take the medicine every day at the same time each day. ? Understand what foods and drugs interact with your medicine. ? Understand the side effects of this medicine, including excessive bruising or bleeding. Ask your health care provider or pharmacist about other side effects. General  instructions  Wear a medical alert bracelet or carry a medical alert card that says you have had a PE and lists what medicines you take.  Ask your health care provider when you may return to your normal activities. Avoid sitting or lying for a long time without moving.  Maintain a healthy weight. Ask your health care provider what weight is healthy for you.  Do not use any products that contain nicotine or tobacco, such as cigarettes, e-cigarettes, and chewing tobacco. If you need help quitting, ask your health care provider.  Talk with your health care provider about any travel plans. It is important to make sure that you are still able to take your medicine while on trips.  Keep all follow-up visits as told by your health care provider. This is important. Contact a health care provider if:  You missed a dose of your blood thinner medicine. Get help right away if:  You have: ? New or increased pain, swelling, warmth, or redness in an arm or leg. ? Numbness or tingling in an arm or leg. ? Shortness of breath during activity or at rest. ? A fever. ? Chest pain. ? A rapid or irregular heartbeat. ? A severe headache. ? Vision changes. ? A serious fall or accident, or you hit your head. ? Stomach (abdominal) pain. ? Blood in your vomit, stool, or urine. ? A cut that will not stop bleeding.  You cough up blood.  You feel light-headed or dizzy.  You cannot move your arms or legs.  You are confused or have memory loss. These symptoms may represent a serious problem that is an emergency. Do not wait to see if the symptoms will go away. Get medical help right away. Call your local emergency services (911 in the [place] Do not drive yourself to the hospital. Summary  A pulmonary embolism (PE) is a sudden blockage or decrease of blood flow in one or both lungs. PE is a dangerous and life-threatening condition that needs to be treated right away.  Treatments for this condition usually  include medicines to thin your blood (anticoagulants) or medicines to break apart blood clots (thrombolytics).  If you are given blood thinners, it is important to take the medicine every day at the same time each day.  Understand what foods and drugs interact with any medicines that you are taking.  If you have signs of PE or DVT, call your local emergency services (911 in the [place] This information is not intended to replace advice given to you by your health care provider. Make sure you discuss any questions you have with your health care provider. Document Revised: 10/20/2017 Document Reviewed: 10/20/2017 Elsevier Patient Education  2020 [employer]. Information on my medicine - ELIQUIS (apixaban)  This medication education was reviewed with me or my healthcare representative as part of my discharge preparation.    Why was Eliquis prescribed for you? Eliquis was prescribed to treat blood clots that may have been found in the veins of your legs (deep vein thrombosis) or in your lungs (pulmonary embolism) and to reduce the risk of them occurring again.  What do You need to know about Eliquis ?  The starting dose is 10 mg (two 5 mg tablets) taken TWICE daily for the FIRST SEVEN (7) DAYS, then on 10/25 @ 10 PM  the dose is reduced to ONE 5 mg tablet taken TWICE daily.  Eliquis may be taken with or without food.   Try to take the dose about the same time in the morning and in the evening. If you have difficulty swallowing the tablet whole please discuss with your pharmacist how to take the medication safely.  Take Eliquis exactly as prescribed and DO NOT stop taking Eliquis without talking to the doctor who prescribed the medication.  Stopping may increase your risk of developing a new blood clot.  Refill your prescription before you run out.  After discharge, you should have regular check-up appointments with your healthcare provider that is prescribing your Eliquis.    What do  you do if you miss a dose? If a dose of ELIQUIS is not taken at the scheduled time, take it as soon as possible on the same day and twice-daily administration should be resumed. The dose should not be doubled to make up for a missed dose.  Important Safety Information A possible side effect of Eliquis is bleeding. You should call your healthcare provider right away if you experience any of the following: ? Bleeding from an injury or your nose that does not stop. ? Unusual colored urine (red or dark brown) or unusual colored stools (red or black). ? Unusual bruising for unknown reasons. ? A serious fall or if you hit your head (even if there is no bleeding).  Some medicines may interact with Eliquis and might increase your risk of bleeding or clotting while on Eliquis. To help avoid this, consult your healthcare provider or pharmacist prior to using any new prescription or non-prescription medications, including herbals, vitamins, non-steroidal anti-inflammatory drugs (NSAIDs) and supplements.  This website has more information on Eliquis (apixaban): http://www.eliquis.com/eliquis/home

## 2019-11-13 NOTE — Progress Notes (Signed)
ANTICOAGULATION CONSULT NOTE -  Pharmacy Consult for lovenox>> Eliquis Indication: pulmonary embolus  Allergies  Allergen Reactions  . Demerol Hives and Itching    Patient Measurements: Height: 5' (152.4 cm) Weight: 61.2 kg (135 lb) IBW/kg (Calculated) : 45.5 Heparin Dosing Weight:  Vital Signs: Temp: 97.7 F (36.5 C) (10/18 0941) Temp Source: Oral (10/18 0803) BP: 138/98 (10/18 1051) Pulse Rate: 93 (10/18 1051)  Labs: Recent Labs    11/12/19 1215 11/12/19 1427 11/13/19 0415  HGB 13.2  --  11.4*  HCT 40.3  --  35.4*  PLT 233  --  233  CREATININE 0.78  --   --   TROPONINIHS <2 <2  --     Estimated Creatinine Clearance: 72.6 mL/min (by C-G formula based on SCr of 0.78 mg/dL).   Medical History: Past Medical History:  Diagnosis Date  . Gallstones     Medications:  Medications Prior to Admission  Medication Sig Dispense Refill Last Dose  . acyclovir (ZOVIRAX) 400 MG tablet Take 400 mg by mouth 2 (two) times daily.   11/12/2019 at Unknown time  . hydrOXYzine (VISTARIL) 50 MG capsule Take 50 mg by mouth 3 (three) times daily as needed.   11/11/2019 at Unknown time  . ibuprofen (ADVIL,MOTRIN) 200 MG tablet Take 400 mg by mouth every 6 (six) hours as needed for pain.   Past Month at Unknown time  . mirtazapine (REMERON) 45 MG tablet Take 45 mg by mouth at bedtime.   11/12/2019 at Unknown time  . omeprazole (PRILOSEC) 20 MG capsule Take 1 capsule (20 mg total) by mouth daily. 30 capsule 0 11/12/2019 at Unknown time    Assessment: Pharmacy consulted to dose lovenox in patient with pulmonary embolus, confirmed by CT.  Goal of Therapy:   Monitor platelets by anticoagulation protocol: Yes   Plan:  Stop lovenox. Start apixaban 10 mg twice daily at 2000 tonight x 7 days. Followed by apixaban 5 mg twice daily thereafter. Monitor labs and s/s of bleeding.  Kimberly Rocha Kimberly Rocha 11/13/2019,11:09 AM

## 2019-11-13 NOTE — ED Notes (Signed)
ECHO and U/S completed.  Pt ate breakfast.  Informed Crystal that pt be on floor shortly.

## 2019-11-13 NOTE — Discharge Summary (Signed)
Rande BruntLatoye A Henrie ZOX:[id number] DOB: April 06, 1975 DOA: 11/12/2019  PCP: Benita StabileHall, John Z, MD  Admit date: 11/12/2019 Discharge date: 11/13/2019  Admitted From: home Disposition:  home  Recommendations for Outpatient Follow-up:  1. Follow up with PCP in 1-2 weeks 2. Please obtain BMP/CBC in one week 3. Please follow up on the following pending results:  Home Health:None  Equipment/Devices:None  Discharge Condition:Stable  CODE STATUS:FULL   Brief/Interim Summary: History of present illness:  Ms. Moshe CiproBritt is a 44 year old female with medical history significant for tobacco use, anxiety, GERD who presents to the ED on 10/17 with sudden onset of severe right-sided chest pain with radiation to the back that worsened with breathing in the setting [place] (approximately 7 hours including to and from her destination).  Patient was admitted with provoked acute PE. In the ED patient was hemodynamically stable, normal oxygen saturation 95% on room air, troponin trend x2 unremarkable, lab work unremarkable with no ischemic changes on EKG.  Patient was brought to hospital under observation status.  CTA was consistent with subsegmental embolus of the right lower lobe and right upper lobe with wedge-shaped opacity consistent with pulmonary infarction.  Patient underwent TTE which showed EF of 65-70% and normal right ventricular systolic function.  Bilateral venous duplex was negative for DVTs.  Patient was able to ambulate without any need for supplemental oxygen.  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  In the setting of prolonged immobilization while flying.  Otherwise patient has no other risk factors (no family history, no hormonal use, patient does have history of  tobacco use).  TTE showed preserved EF.  Patient maintained normal oxygen saturations.  Troponin unremarkable.  DVT venous duplex unremarkable.  Pleuritic chest pain likely due to pulmonary infarct well-controlled on oral pain medications -Patient was transitioned from IV Lovenox to Eliquis and provided prescription on discharge-strict return precautions provided -Cessation of tobacco use -Close follow-up with PCP -Short course of tramadol as needed severe pain  Tobacco use.  Smokes half pack a day.  Counseled on quitting during hospitalization -Patient is ready for plan to quit -Nicotine patch on discharge  Consultations:  none  Procedures/Studies: Venous duplex, 10/18.  No evidence of DVT in the lower extremity    TTE, 10/18 EF 65 to 70%, no regional wall motion maladies, right ventricular systolic function is normal, right ventricular size is normal  Subjective: Reports 2 out of 10 chest pain with deep breath.  Oral pain medication helps Discharge Exam: Vitals:   11/13/19 0941 11/13/19 1051  BP:  (!) 138/98  Pulse:  93  Resp:  18  Temp: 97.7 F (36.5 C)   SpO2:  95%   Vitals:   11/13/19 0830 11/13/19 0900 11/13/19 0941 11/13/19 1051  BP: 123/88 116/81  (!) 138/98  Pulse: 94 91  93  Resp:  16  18  Temp:   97.7 F (36.5 C)   TempSrc:      SpO2: 93% 96%  95%  Weight:      Height:        General: Lying in bed, no apparent distress Eyes: EOMI, anicteric ENT: Oral Mucosa clear and moist Cardiovascular: regular rate and rhythm, no murmurs, rubs or gallops, no edema, Respiratory: Normal respiratory effort on room air, lungs clear to auscultation bilaterally Abdomen: soft,  non-distended, non-tender, normal bowel sounds Skin: No Rash Neurologic: Grossly no focal neuro deficit.Mental status AAOx3, speech normal, Psychiatric:Appropriate affect, and mood  Discharge Diagnoses:  Active Problems:   Pulmonary embolism [facility])    Discharge Instructions  Discharge  Instructions    Diet - low sodium heart healthy   Complete by: As directed    Increase activity slowly   Complete by: As directed      Allergies as of 11/13/2019      Reactions   Demerol Hives, Itching      Medication List    STOP taking these medications   ibuprofen 200 MG tablet Commonly known as: ADVIL     TAKE these medications   acyclovir 400 MG tablet Commonly known as: ZOVIRAX Take 400 mg by mouth 2 (two) times daily.   Apixaban Starter Pack (10mg  and 5mg ) Commonly known as: ELIQUIS STARTER PACK Take as directed on package: start with two-5mg  tablets twice daily for 7 days. On day 8, switch to one-5mg  tablet twice daily.   hydrOXYzine 50 MG capsule Commonly known as: VISTARIL Take 50 mg by mouth 3 (three) times daily as needed.   mirtazapine 45 MG tablet Commonly known as: REMERON Take 45 mg by mouth at bedtime.   omeprazole 20 MG capsule Commonly known as: PRILOSEC Take 1 capsule (20 mg total) by mouth daily.   traMADol 50 MG tablet Commonly known as: ULTRAM Take 1 tablet (50 mg total) by mouth every 6 (six) hours as needed for up to 5 days for severe pain.       Allergies  Allergen Reactions  . Demerol Hives and Itching        The results of significant diagnostics from this hospitalization (including imaging, microbiology, ancillary and laboratory) are listed below for reference.     Microbiology: Recent Results (from the past 240 hour(s))  Respiratory Panel by RT PCR (Flu A&B, Covid) - Nasopharyngeal Swab     Status: None   Collection Time: 11/12/19  4:00 PM   Specimen: Nasopharyngeal Swab  Result Value Ref Range Status   SARS Coronavirus 2 by RT PCR NEGATIVE NEGATIVE Final    Comment: (NOTE) SARS-CoV-2 target nucleic acids are NOT DETECTED.  The SARS-CoV-2 RNA is generally detectable in upper respiratoy specimens during the acute phase of infection. The lowest concentration of SARS-CoV-2 viral copies this assay can detect is 131  copies/mL. A negative result does not preclude SARS-Cov-2 infection and should not be used as the sole basis for treatment or other patient management decisions. A negative result may occur with  improper specimen collection/handling, submission of specimen other than nasopharyngeal swab, presence of viral mutation(s) within the areas targeted by this assay, and inadequate number of viral copies (<131 copies/mL). A negative result must be combined with clinical observations, patient history, and epidemiological information. The expected result is Negative.  Fact Sheet for Patients:   Fact Sheet for Healthcare Providers:  11/14/19  This test is no t yet approved or cleared by the https://www.moore.com/ FDA and  has been authorized for detection and/or diagnosis of SARS-CoV-2 by FDA under an Emergency Use Authorization (EUA). This EUA will remain  in effect (meaning this test can be used) for the duration of the COVID-19 declaration under Section 564(b)(1) of the Act, 21 U.S.C. section 360bbb-3(b)(1), unless the authorization is terminated or revoked sooner.     Influenza A by PCR NEGATIVE NEGATIVE Final   Influenza B by PCR NEGATIVE NEGATIVE Final  Comment: (NOTE) The Xpert Xpress SARS-CoV-2/FLU/RSV assay is intended as an aid in  the diagnosis of influenza from Nasopharyngeal swab specimens and  should not be used as a sole basis for treatment. Nasal washings and  aspirates are unacceptable for Xpert Xpress SARS-CoV-2/FLU/RSV  testing.  Fact Sheet for Patients: https://www.moore.com/  Fact Sheet for Healthcare Providers: https://www.young.biz/  This test is not yet approved or cleared by the [place] FDA and  has been authorized for detection and/or diagnosis of SARS-CoV-2 by  FDA under an Emergency Use Authorization (EUA). This EUA will remain  in effect (meaning  this test can be used) for the duration of the  Covid-19 declaration under Section 564(b)(1) of the Act, 21  U.S.C. section 360bbb-3(b)(1), unless the authorization is  terminated or revoked. Performed at [facility], [address], [place], [place] [address]      Labs: BNP (last 3 results) No results for input(s): BNP in the last 8760 hours. Basic Metabolic Panel: Recent Labs  Lab 11/12/19 1215  NA 138  K 3.7  CL 106  CO2 25  GLUCOSE 98  BUN 7  CREATININE 0.78  CALCIUM 9.2   Liver Function Tests: No results for input(s): AST, ALT, ALKPHOS, BILITOT, PROT, ALBUMIN in the last 168 hours. No results for input(s): LIPASE, AMYLASE in the last 168 hours. No results for input(s): AMMONIA in the last 168 hours. CBC: Recent Labs  Lab 11/12/19 1215 11/13/19 0415  WBC 10.5 11.1*  HGB 13.2 11.4*  HCT 40.3 35.4*  MCV 100.0 99.7  PLT 233 233   Cardiac Enzymes: No results for input(s): CKTOTAL, CKMB, CKMBINDEX, TROPONINI in the last 168 hours. BNP: Invalid input(s): POCBNP CBG: No results for input(s): GLUCAP in the last 168 hours. D-Dimer No results for input(s): DDIMER in the last 72 hours. Hgb A1c No results for input(s): HGBA1C in the last 72 hours. Lipid Profile No results for input(s): CHOL, HDL, LDLCALC, TRIG, CHOLHDL, LDLDIRECT in the last 72 hours. Thyroid function studies No results for input(s): TSH, T4TOTAL, T3FREE, THYROIDAB in the last 72 hours.  Invalid input(s): FREET3 Anemia work up No results for input(s): VITAMINB12, FOLATE, FERRITIN, TIBC, IRON, RETICCTPCT in the last 72 hours. Urinalysis    Component Value Date/Time   COLORURINE YELLOW 10/17/2018 1532   APPEARANCEUR HAZY (A) 10/17/2018 1532   LABSPEC 1.024 10/17/2018 1532   PHURINE 5.0 10/17/2018 1532   GLUCOSEU NEGATIVE 10/17/2018 1532   HGBUR SMALL (A) 10/17/2018 1532   BILIRUBINUR NEGATIVE 10/17/2018 1532   KETONESUR NEGATIVE 10/17/2018 1532   PROTEINUR NEGATIVE 10/17/2018 1532    UROBILINOGEN 0.2 07/23/2012 0825   NITRITE POSITIVE (A) 10/17/2018 1532   LEUKOCYTESUR NEGATIVE 10/17/2018 1532   Sepsis Labs Invalid input(s): PROCALCITONIN,  WBC,  LACTICIDVEN Microbiology Recent Results (from the past 240 hour(s))  Respiratory Panel by RT PCR (Flu A&B, Covid) - Nasopharyngeal Swab     Status: None   Collection Time: 11/12/19  4:00 PM   Specimen: Nasopharyngeal Swab  Result Value Ref Range Status   SARS Coronavirus 2 by RT PCR NEGATIVE NEGATIVE Final    Comment: (NOTE) SARS-CoV-2 target nucleic acids are NOT DETECTED.  The SARS-CoV-2 RNA is generally detectable in upper respiratoy specimens during the acute phase of infection. The lowest concentration of SARS-CoV-2 viral copies this assay can detect is 131 copies/mL. A negative result does not preclude SARS-Cov-2 infection and should not be used as the sole basis for treatment or other patient management decisions. A negative result may  occur with  improper specimen collection/handling, submission of specimen other than nasopharyngeal swab, presence of viral mutation(s) within the areas targeted by this assay, and inadequate number of viral copies (<131 copies/mL). A negative result must be combined with clinical observations, patient history, and epidemiological information. The expected result is Negative.  Fact Sheet for Patients:  https://www.moore.com/  Fact Sheet for Healthcare Providers:  https://www.young.biz/  This test is no t yet approved or cleared by the [place] FDA and  has been authorized for detection and/or diagnosis of SARS-CoV-2 by FDA under an Emergency Use Authorization (EUA). This EUA will remain  in effect (meaning this test can be used) for the duration of the COVID-19 declaration under Section 564(b)(1) of the Act, 21 U.S.C. section 360bbb-3(b)(1), unless the authorization is terminated or revoked sooner.     Influenza A by PCR NEGATIVE  NEGATIVE Final   Influenza B by PCR NEGATIVE NEGATIVE Final    Comment: (NOTE) The Xpert Xpress SARS-CoV-2/FLU/RSV assay is intended as an aid in  the diagnosis of influenza from Nasopharyngeal swab specimens and  should not be used as a sole basis for treatment. Nasal washings and  aspirates are unacceptable for Xpert Xpress SARS-CoV-2/FLU/RSV  testing.  Fact Sheet for Patients: https://www.moore.com/  Fact Sheet for Healthcare Providers: https://www.young.biz/  This test is not yet approved or cleared by the [place] FDA and  has been authorized for detection and/or diagnosis of SARS-CoV-2 by  FDA under an Emergency Use Authorization (EUA). This EUA will remain  in effect (meaning this test can be used) for the duration of the  Covid-19 declaration under Section 564(b)(1) of the Act, 21  U.S.C. section 360bbb-3(b)(1), unless the authorization is  terminated or revoked. Performed at [facility], [address], [place], [place] [address]      Time coordinating discharge: Over 30 minutes  SIGNED:   Laverna Peace, MD  [facility] 11/13/2019, 1:57 PM Pager   If 7PM-7AM, please contact night-coverage [email or web address] Password [email or web address]

## 2020-03-21 ENCOUNTER — Ambulatory Visit: Payer: Self-pay

## 2020-03-22 ENCOUNTER — Ambulatory Visit: Payer: Self-pay

## 2020-04-21 ENCOUNTER — Other Ambulatory Visit: Payer: Self-pay

## 2020-04-21 ENCOUNTER — Emergency Department (HOSPITAL_COMMUNITY): Payer: Self-pay

## 2020-04-21 ENCOUNTER — Emergency Department (HOSPITAL_COMMUNITY)
Admission: EM | Admit: 2020-04-21 | Discharge: 2020-04-21 | Disposition: A | Discharge status: Home / Self Care | Payer: Self-pay | Attending: Emergency Medicine | Admitting: Emergency Medicine

## 2020-04-21 ENCOUNTER — Encounter (HOSPITAL_COMMUNITY): Payer: Self-pay | Admitting: *Deleted

## 2020-04-21 DIAGNOSIS — R0602 Shortness of breath: Secondary | ICD-10-CM | POA: Insufficient documentation

## 2020-04-21 DIAGNOSIS — R079 Chest pain, unspecified: Secondary | ICD-10-CM | POA: Insufficient documentation

## 2020-04-21 DIAGNOSIS — R202 Paresthesia of skin: Secondary | ICD-10-CM | POA: Insufficient documentation

## 2020-04-21 DIAGNOSIS — Z7901 Long term (current) use of anticoagulants: Secondary | ICD-10-CM | POA: Insufficient documentation

## 2020-04-21 DIAGNOSIS — Z87891 Personal history of nicotine dependence: Secondary | ICD-10-CM | POA: Insufficient documentation

## 2020-04-21 HISTORY — DX: Elevated blood-pressure reading, without diagnosis of hypertension: R03.0

## 2020-04-21 HISTORY — DX: Disorder of lipoprotein metabolism, unspecified: E78.9

## 2020-04-21 LAB — CBC WITH DIFFERENTIAL/PLATELET
Abs Immature Granulocytes: 0.03 10*3/uL (ref 0.00–0.07)
Basophils Absolute: 0.1 10*3/uL (ref 0.0–0.1)
Basophils Relative: 1 %
Eosinophils Absolute: 0 10*3/uL (ref 0.0–0.5)
Eosinophils Relative: 1 %
HCT: 38.8 % (ref 36.0–46.0)
Hemoglobin: 13.6 g/dL (ref 12.0–15.0)
Immature Granulocytes: 0 %
Lymphocytes Relative: 33 %
Lymphs Abs: 2.9 10*3/uL (ref 0.7–4.0)
MCH: 33.9 pg (ref 26.0–34.0)
MCHC: 35.1 g/dL (ref 30.0–36.0)
MCV: 96.8 fL (ref 80.0–100.0)
Monocytes Absolute: 0.6 10*3/uL (ref 0.1–1.0)
Monocytes Relative: 6 %
Neutro Abs: 5.3 10*3/uL (ref 1.7–7.7)
Neutrophils Relative %: 59 %
Platelets: 242 10*3/uL (ref 150–400)
RBC: 4.01 MIL/uL (ref 3.87–5.11)
RDW: 14.6 % (ref 11.5–15.5)
WBC: 8.8 10*3/uL (ref 4.0–10.5)
nRBC: 0 % (ref 0.0–0.2)

## 2020-04-21 LAB — URINALYSIS, ROUTINE W REFLEX MICROSCOPIC
Bacteria, UA: NONE SEEN
Bilirubin Urine: NEGATIVE
Glucose, UA: NEGATIVE mg/dL
Ketones, ur: 5 mg/dL — AB
Leukocytes,Ua: NEGATIVE
Nitrite: NEGATIVE
Protein, ur: NEGATIVE mg/dL
Specific Gravity, Urine: 1.023 (ref 1.005–1.030)
pH: 6 (ref 5.0–8.0)

## 2020-04-21 LAB — COMPREHENSIVE METABOLIC PANEL
ALT: 12 U/L (ref 0–44)
AST: 13 U/L — ABNORMAL LOW (ref 15–41)
Albumin: 4.2 g/dL (ref 3.5–5.0)
Alkaline Phosphatase: 64 U/L (ref 38–126)
Anion gap: 8 (ref 5–15)
BUN: 9 mg/dL (ref 6–20)
CO2: 25 mmol/L (ref 22–32)
Calcium: 9.1 mg/dL (ref 8.9–10.3)
Chloride: 104 mmol/L (ref 98–111)
Creatinine, Ser: 0.76 mg/dL (ref 0.44–1.00)
GFR, Estimated: >60 mL/min (ref >60)
Glucose, Bld: 104 mg/dL — ABNORMAL HIGH (ref 70–99)
Potassium: 3.3 mmol/L — ABNORMAL LOW (ref 3.5–5.1)
Sodium: 137 mmol/L (ref 135–145)
Total Bilirubin: 0.5 mg/dL (ref 0.3–1.2)
Total Protein: 7.4 g/dL (ref 6.5–8.1)

## 2020-04-21 LAB — TROPONIN I (HIGH SENSITIVITY)
Troponin I (High Sensitivity): <2 ng/L (ref <18)
Troponin I (High Sensitivity): <2 ng/L (ref <18)

## 2020-04-21 LAB — PREGNANCY, URINE: Preg Test, Ur: NEGATIVE

## 2020-04-21 IMAGING — CT CT ANGIO CHEST
2 of 6 series · 19 of 36 positions shown · IV contrast (Omnipaque or Isovue)
Comparison: [DATE]

CLINICAL DATA: Central chest pain and shortness of breath. History
of PE

EXAM:
CT ANGIOGRAPHY CHEST WITH CONTRAST
TECHNIQUE: Multidetector CT imaging of the chest was performed using the
standard protocol during bolus administration of intravenous
contrast. Multiplanar CT image reconstructions and MIPs were
obtained to evaluate the vascular anatomy.
CONTRAST:  125mL OMNIPAQUE IOHEXOL 350 MG/ML SOLN

[Series 9: pe axial thins · axial · 0.67mm/px · z∈[-1000,-741]mm · 18 of 360 slices shown]
[im 18/360  lung]
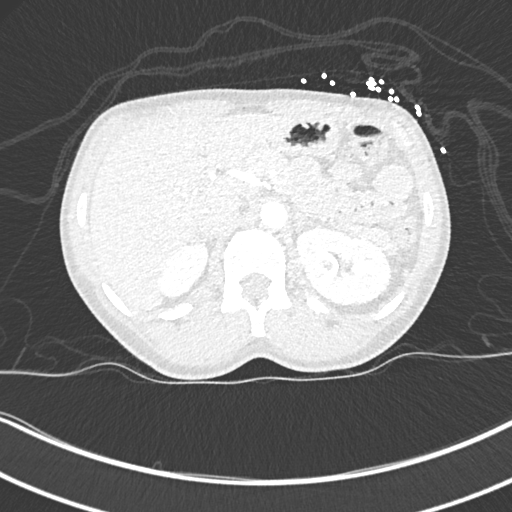
[im 35/360  mediastinal]
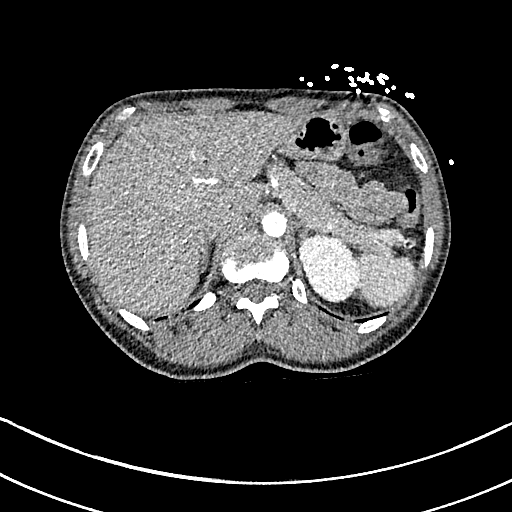
[im 52/360  lung]
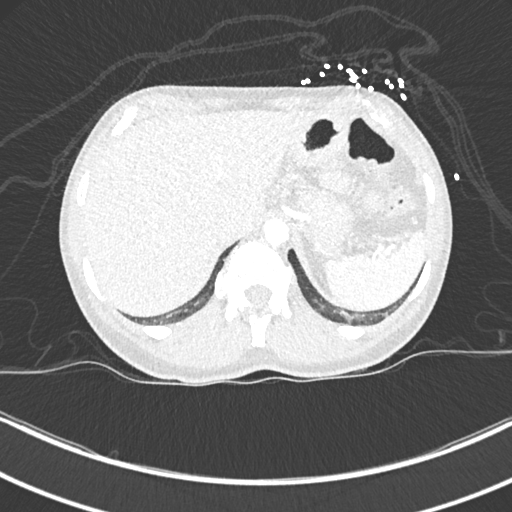
[im 69/360  mediastinal]
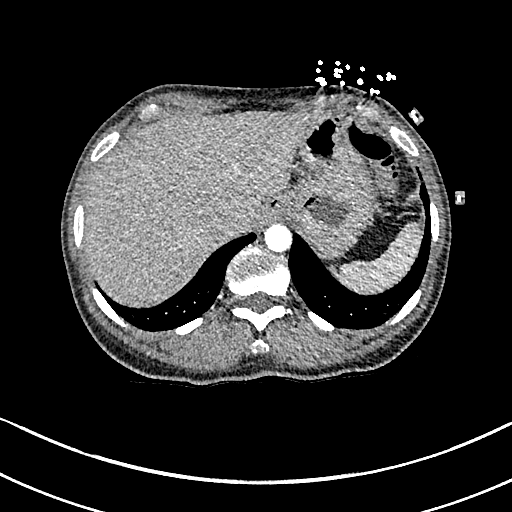
[im 103/360  lung]
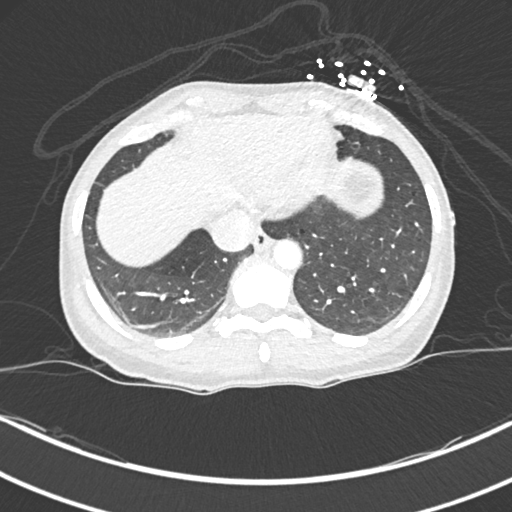
[im 120/360  mediastinal]
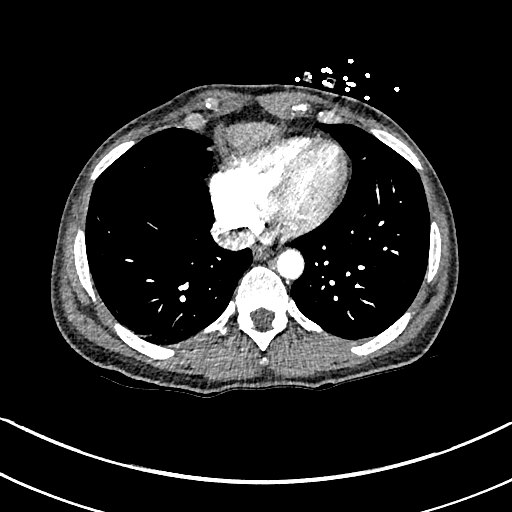
[im 137/360  lung]
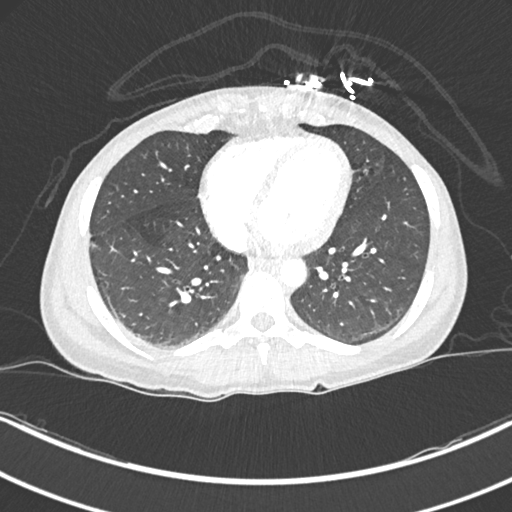
[im 154/360  mediastinal]
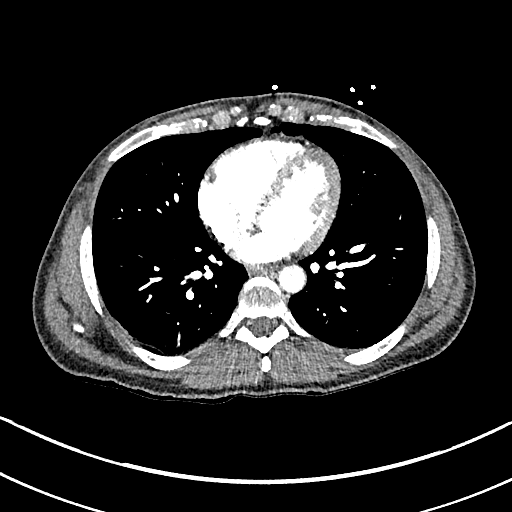
[im 171/360  lung]
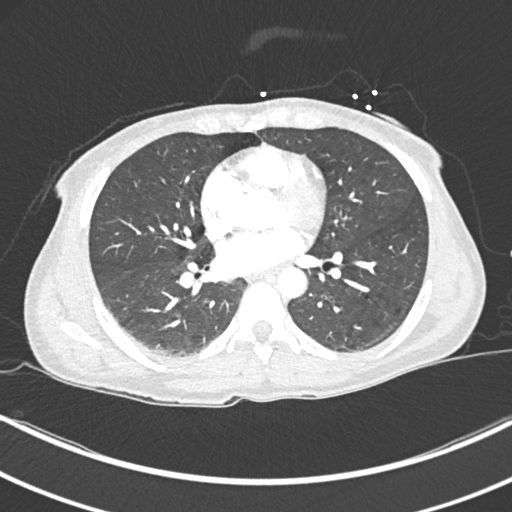
[im 189/360  mediastinal]
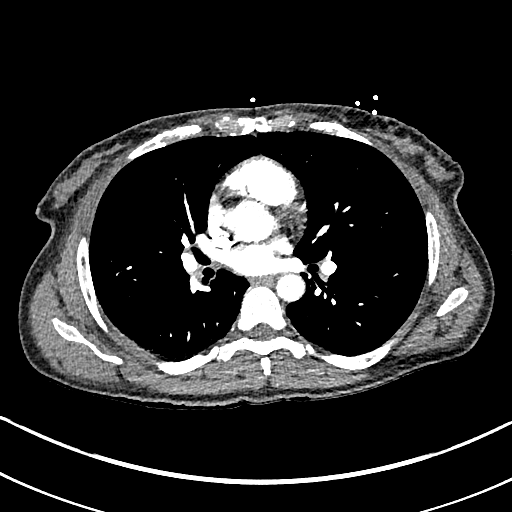
[im 206/360  lung]
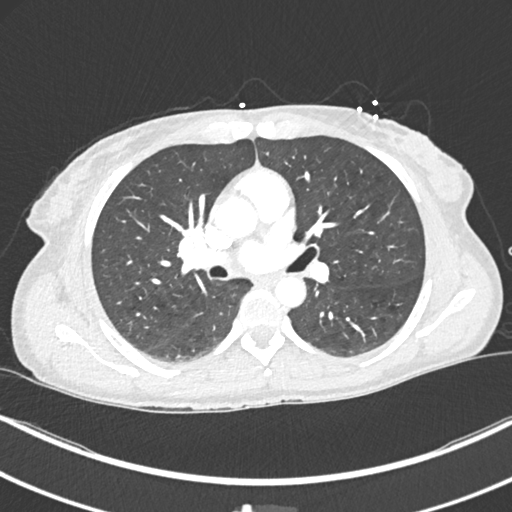
[im 223/360  mediastinal]
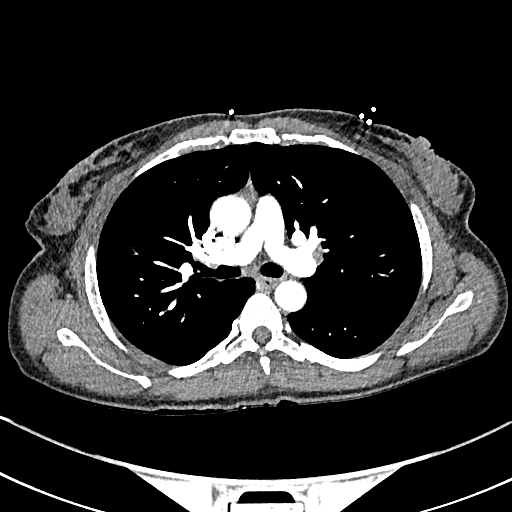
[im 240/360  lung]
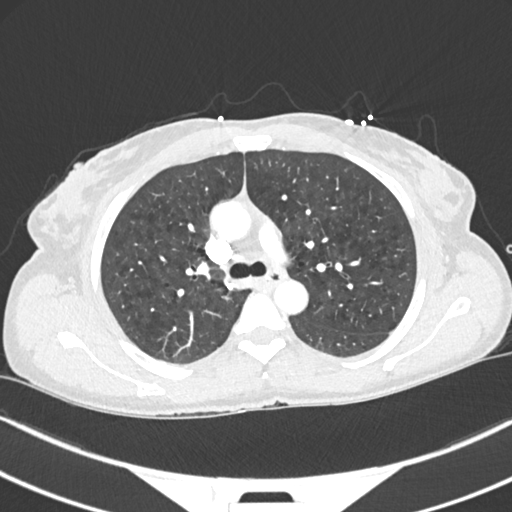
[im 274/360  mediastinal]
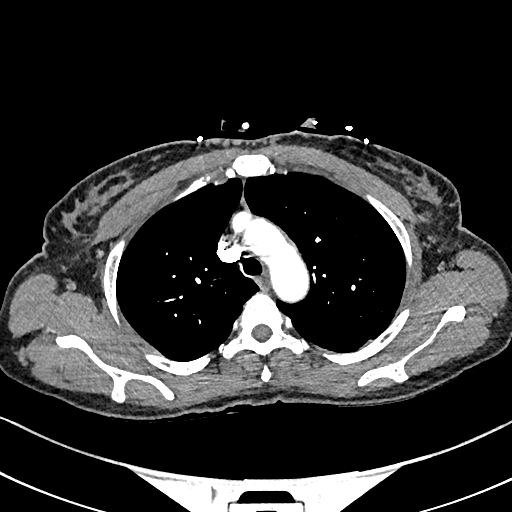
[im 291/360  lung]
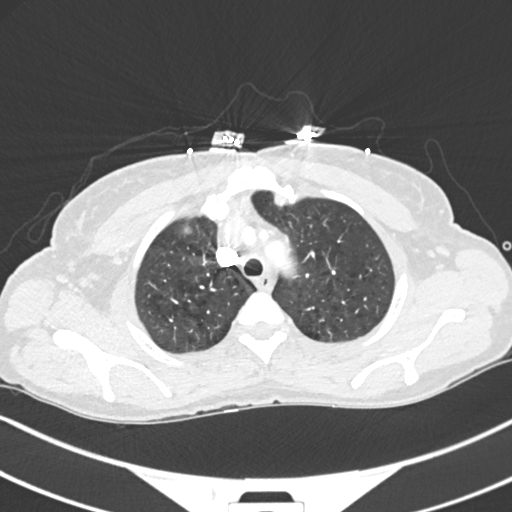
[im 308/360  mediastinal]
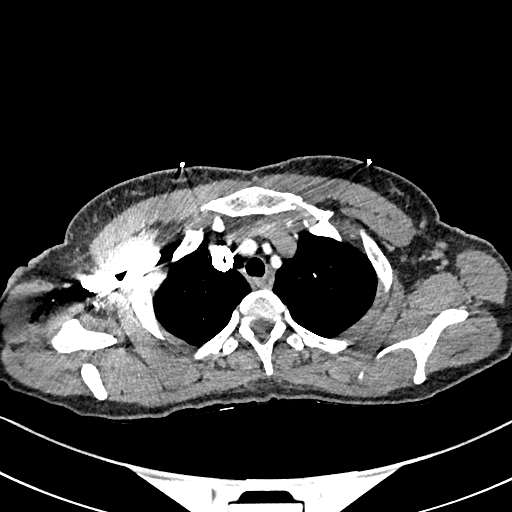
[im 325/360  lung]
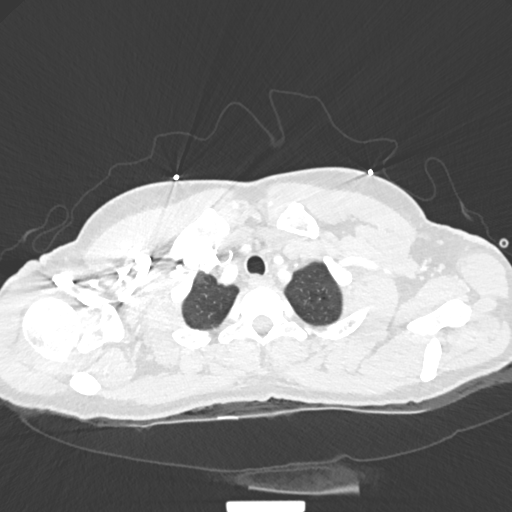
[im 342/360  mediastinal]
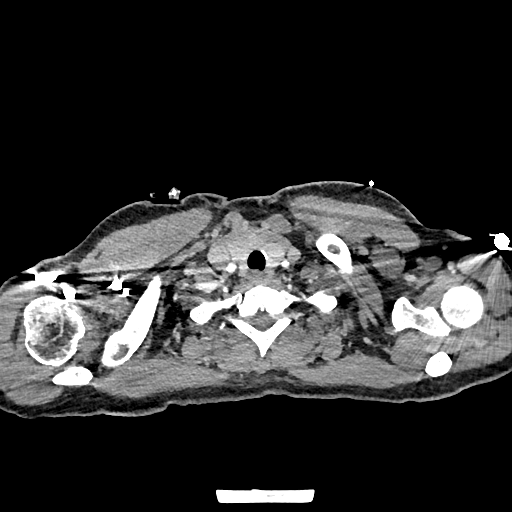

[Series 11: cor soft · coronal · 0.58mm/px · 1 of 119 slices shown]
[im 60/119  mediastinal]
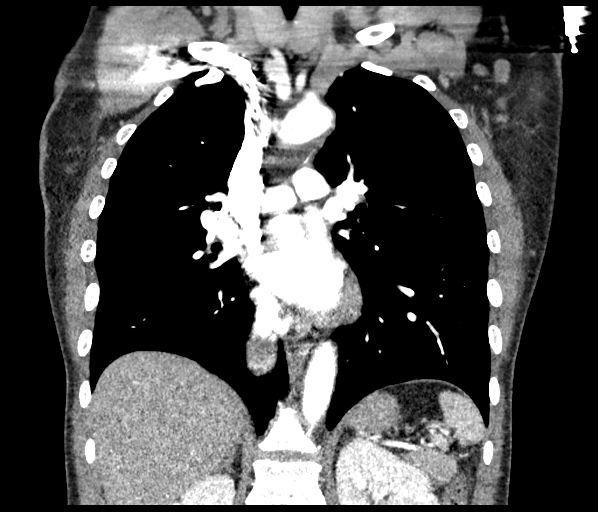

[19 of 36 positions shown; findings below may reference images not displayed]

FINDINGS: Cardiovascular: Satisfactory opacification of the pulmonary arteries
to the segmental level. No evidence of pulmonary embolism.
Previously seen right-sided pulmonary emboli are no longer evident.
Thoracic aorta is normal in course and caliber. Normal heart size.
No pericardial effusion.

Mediastinum/Nodes: No enlarged mediastinal, hilar, or axillary lymph
nodes. Thyroid gland, trachea, and esophagus demonstrate no
significant findings.

Lungs/Pleura: Mild bibasilar atelectasis. Subtle mosaic attenuation
of the upper to mid lung fields. No focal airspace consolidation. No
pleural effusion or pneumothorax.

Upper Abdomen: No acute abnormality.

Musculoskeletal: No chest wall abnormality. No acute or significant
osseous findings.

Review of the MIP images confirms the above findings.
IMPRESSION: 1. Negative for pulmonary embolism.
2. Subtle mosaic attenuation of the upper to mid lung fields, which
may represent air trapping or small airways disease.

## 2020-04-21 IMAGING — CT CT ANGIO HEAD
2 of 11 series · 7 of 33 positions shown · IV contrast (omnipaque)
Comparison: None.

CLINICAL DATA: Syncope

EXAM:
CT ANGIOGRAPHY HEAD AND NECK
TECHNIQUE: Multidetector CT imaging of the head and neck was performed using
the standard protocol during bolus administration of intravenous
contrast. Multiplanar CT image reconstructions and MIPs were
obtained to evaluate the vascular anatomy. Carotid stenosis
measurements (when applicable) are obtained utilizing NASCET
criteria, using the distal internal carotid diameter as the
denominator.
CONTRAST:  125mL OMNIPAQUE IOHEXOL 350 MG/ML SOLN

[Series 7: cta head & neck · axial · 0.47mm/px · z∈[-677,-573]mm · 2 of 158 slices shown]
[im 53/158  soft-tissue]
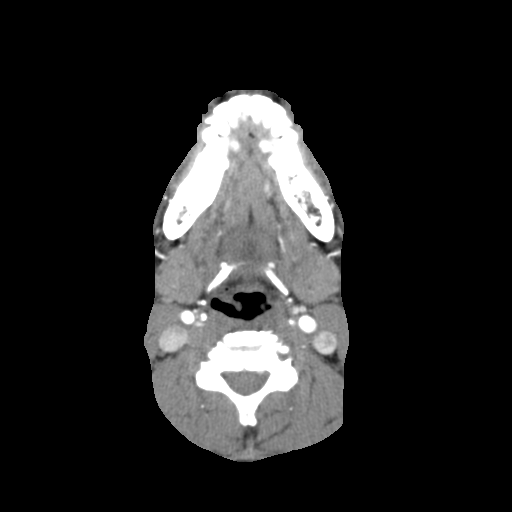
[im 105/158  soft-tissue]
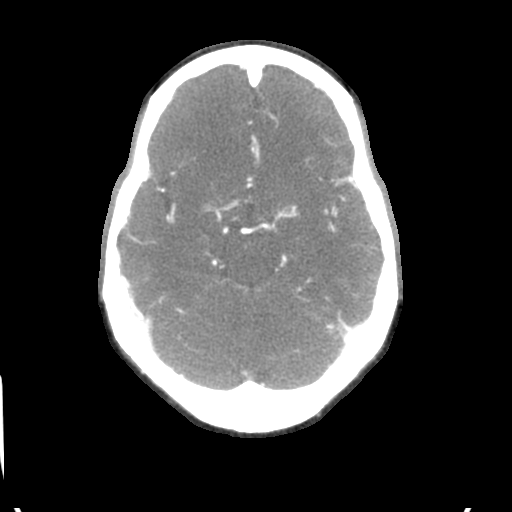

[Series 9: ax thins · axial · 0.39mm/px · z∈[-729,-522]mm · 5 of 311 slices shown]
[im 52/311  soft-tissue]
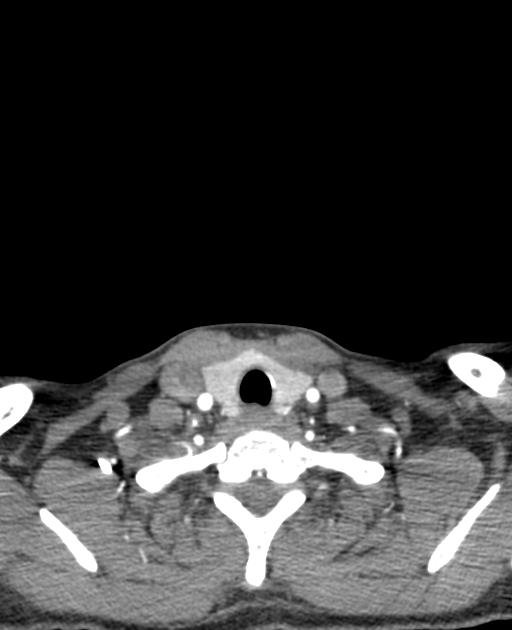
[im 104/311  bone]
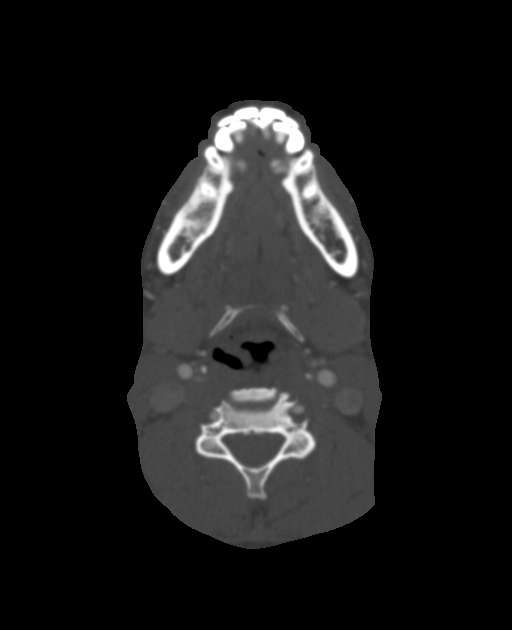
[im 156/311  soft-tissue]
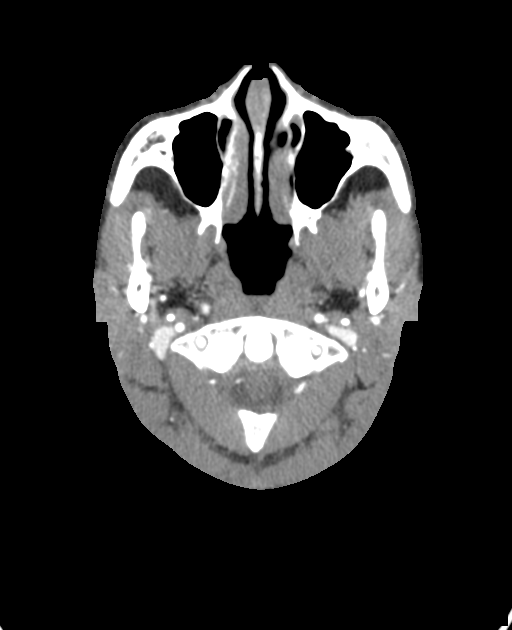
[im 207/311  bone]
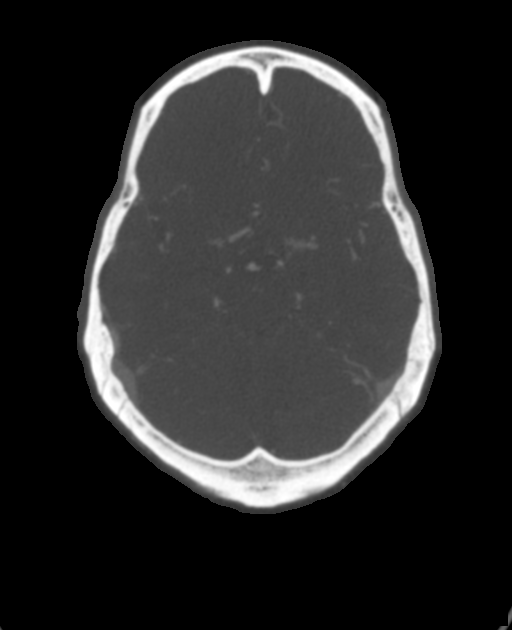
[im 259/311  soft-tissue]
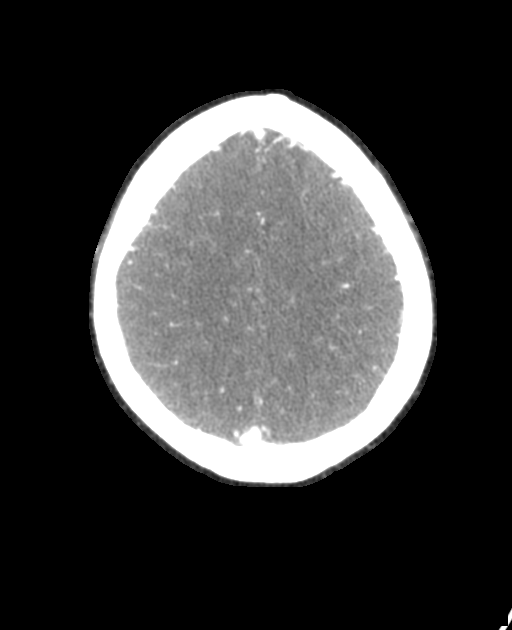

[7 of 33 positions shown; findings below may reference images not displayed]

FINDINGS: CT HEAD

Brain: There is no acute intracranial hemorrhage, mass effect, or
edema. Gray-white differentiation is preserved. There is no
extra-axial fluid collection. Ventricles and sulci are within normal
limits in size and configuration.

Vascular: No hyperdense vessel or unexpected calcification.

Skull: Calvarium is unremarkable.

Sinuses/Orbits: No acute finding.

Other: None.

Review of the MIP images confirms the above findings

CTA NECK

Aortic arch: Great vessel origins are partially included. Visualized
portions are patent.

Right carotid system: Patent. No stenosis or evidence of dissection.

Left carotid system: Patent.  No stenosis or evidence of dissection.

Vertebral arteries: Patent and codominant. No stenosis or evidence
of dissection.

Skeleton: Degenerative changes primarily at C4-C5.

Other neck: Unremarkable.

Upper chest: Emphysema.

Review of the MIP images confirms the above findings

CTA HEAD

Arterial evaluation is suboptimal due to significant venous
enhancement.

Anterior circulation: Intracranial internal carotid arteries are
patent with mild mixed plaque. Anterior and middle cerebral arteries
are patent.

Posterior circulation: Intracranial vertebral arteries are patent.
Basilar artery is patent. Major cerebellar artery origins are
patent. Posterior cerebral arteries are patent.

Venous sinuses: Patent.

Review of the MIP images confirms the above findings
IMPRESSION: No acute intracranial abnormality. No large vessel occlusion,
hemodynamically significant stenosis, or evidence of dissection.

## 2020-04-21 MED ORDER — POTASSIUM CHLORIDE 20 MEQ PO PACK
60.0000 meq | PACK | Freq: Once | ORAL | Status: AC
  Administered 2020-04-21: 60 meq via ORAL
  Filled 2020-04-21: qty 3

## 2020-04-21 MED ORDER — HYDROCODONE-ACETAMINOPHEN 5-325 MG PO TABS
1.0000 | ORAL_TABLET | Freq: Once | ORAL | Status: AC
Start: 2020-04-21 — End: 2020-04-21
  Administered 2020-04-21: 1 via ORAL
  Filled 2020-04-21: qty 1

## 2020-04-21 MED ORDER — IOHEXOL 350 MG/ML SOLN
125.0000 mL | Freq: Once | INTRAVENOUS | Status: AC | PRN
  Administered 2020-04-21: 125 mL via INTRAVENOUS

## 2020-04-21 NOTE — ED Notes (Signed)
Pt ambulated to and from bathroom with steady gait. Reconnected to monitors and resting in bed. RR even and nonlabored. Updated on POC and she expressed understanding. Call bell in reach. Bed low and locked. Will continue to monitor.

## 2020-04-21 NOTE — ED Notes (Signed)
Spoke with Bria with social work.

## 2020-04-21 NOTE — TOC Transition Note (Signed)
Transition of Care [facility]) - CM/SW Discharge Note   Patient Details  Name: Kimberly Rocha MRN: [id number] Date of Birth: 1975/02/25  [facility] of [facility] CM/SW Contact:  Barry Brunner, LCSW Phone Number: 04/21/2020, 2:41 PM   Clinical Narrative:    CSW received consult for Eliquis voucher and PCP. CSW contacted Brett Canales with Pharmacy. Brett Canales agreeable to provide voucher. CSW provided patient with Care Connect form. Nurse notified CSW that patient pulled out IV and left. CSW also not able to inquire about medicaid referral. TOC signing off.          Patient Goals and CMS Choice        Discharge Placement                       Discharge Plan and Services                                     Social Determinants of Health (SDOH) Interventions     Readmission Risk Interventions No flowsheet data found.
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
## 2020-04-21 NOTE — Discharge Instructions (Signed)
Please continue to take your Eliquis as prescribed.  If your symptoms worsen, you need to return to the emergency department immediately for reevaluation.  Please follow-up with your regular doctor regarding your Eliquis use.  It was a pleasure to meet you.

## 2020-04-21 NOTE — ED Notes (Signed)
This nurse arrived to DC pt. She was dressed in room, had removed her IV. IV catheter and tubing intact on counter. IV site clean and dry. States she does not need consult assistance with SW about Elequis, she has a prescription through October and will be seeing a specialist. DC instructions reviewed with pt and she expressed understanding. Son is present to take her home. Denies any additional needs at this time.

## 2020-04-21 NOTE — ED Notes (Signed)
Pt c/o generalized pain, asked if she could have pain medication. Contacted provider for orders. Daughter will be driving her home.

## 2020-05-24 ENCOUNTER — Other Ambulatory Visit (HOSPITAL_BASED_OUTPATIENT_CLINIC_OR_DEPARTMENT_OTHER): Payer: Self-pay

## 2020-05-24 DIAGNOSIS — G4733 Obstructive sleep apnea (adult) (pediatric): Secondary | ICD-10-CM

## 2020-06-07 ENCOUNTER — Institutional Professional Consult (permissible substitution): Payer: Self-pay | Admitting: Pulmonary Disease

## 2020-06-10 ENCOUNTER — Encounter: Payer: Self-pay | Admitting: Neurology

## 2020-07-03 ENCOUNTER — Ambulatory Visit: Payer: Self-pay | Admitting: Internal Medicine

## 2020-07-12 ENCOUNTER — Institutional Professional Consult (permissible substitution): Payer: Self-pay | Admitting: Pulmonary Disease

## 2020-07-18 ENCOUNTER — Encounter: Payer: Self-pay | Admitting: Internal Medicine

## 2020-07-18 ENCOUNTER — Other Ambulatory Visit: Payer: Self-pay

## 2020-07-18 ENCOUNTER — Ambulatory Visit (INDEPENDENT_AMBULATORY_CARE_PROVIDER_SITE_OTHER): Payer: Self-pay | Admitting: Internal Medicine

## 2020-07-18 VITALS — BP 119/88 | HR 97 | Temp 98.2°F | Resp 18 | Ht 60.0 in | Wt 111.0 lb

## 2020-07-18 DIAGNOSIS — L309 Dermatitis, unspecified: Secondary | ICD-10-CM

## 2020-07-18 DIAGNOSIS — F418 Other specified anxiety disorders: Secondary | ICD-10-CM

## 2020-07-18 DIAGNOSIS — Z8669 Personal history of other diseases of the nervous system and sense organs: Secondary | ICD-10-CM

## 2020-07-18 DIAGNOSIS — Z86711 Personal history of pulmonary embolism: Secondary | ICD-10-CM

## 2020-07-18 DIAGNOSIS — M25512 Pain in left shoulder: Secondary | ICD-10-CM

## 2020-07-18 DIAGNOSIS — Z1231 Encounter for screening mammogram for malignant neoplasm of breast: Secondary | ICD-10-CM

## 2020-07-18 DIAGNOSIS — Z1159 Encounter for screening for other viral diseases: Secondary | ICD-10-CM

## 2020-07-18 DIAGNOSIS — Z124 Encounter for screening for malignant neoplasm of cervix: Secondary | ICD-10-CM

## 2020-07-18 DIAGNOSIS — G8929 Other chronic pain: Secondary | ICD-10-CM

## 2020-07-18 DIAGNOSIS — Z8619 Personal history of other infectious and parasitic diseases: Secondary | ICD-10-CM

## 2020-07-18 DIAGNOSIS — J439 Emphysema, unspecified: Secondary | ICD-10-CM

## 2020-07-18 DIAGNOSIS — Z7689 Persons encountering health services in other specified circumstances: Secondary | ICD-10-CM

## 2020-07-18 MED ORDER — TRIAMCINOLONE ACETONIDE 0.1 % EX CREA: 1.0000 "application " | TOPICAL_CREAM | Freq: Two times a day (BID) | CUTANEOUS | 0 refills | Status: DC

## 2020-07-18 MED ORDER — METHYLPREDNISOLONE 4 MG PO TBPK: ORAL_TABLET | ORAL | 0 refills | Status: DC

## 2020-07-18 MED ORDER — ESCITALOPRAM OXALATE 5 MG PO TABS: 5.0000 mg | ORAL_TABLET | Freq: Every day | ORAL | 2 refills | Status: DC

## 2020-07-18 MED ORDER — CYCLOBENZAPRINE HCL 10 MG PO TABS: 10.0000 mg | ORAL_TABLET | Freq: Three times a day (TID) | ORAL | 0 refills | Status: DC | PRN

## 2020-07-18 NOTE — Assessment & Plan Note (Signed)
Care established Previous chart reviewed History and medications reviewed with the patient 

## 2020-07-18 NOTE — Assessment & Plan Note (Signed)
Followed by Dr Gerilyn Pilgrim On Topiramate

## 2020-07-18 NOTE — Assessment & Plan Note (Signed)
In 11/2019, CT chest reviewed Follow up CT chest negative for PE No provoking concern at that time, but could be acute viral illness leading to hypercoagulable state On Eliquis currently Going to see Pulmonologist

## 2020-07-18 NOTE — Progress Notes (Signed)
Tmo

## 2020-07-18 NOTE — Progress Notes (Signed)
New Patient Office Visit  Subjective:  Patient ID: Kimberly Rocha, female    DOB: Aug 23, 1975  Age: 45 y.o. MRN: [id number]  CC:  Chief Complaint  Patient presents with   New Patient (Initial Visit)    New patient has been seeing [place] has been breaking out in rash and while going to health dept they gave her prednisone and this helped she has been exposed to herpes and doesn't know if this could be the cause also she has a hard bump in between breast has been there for about 3 months also her left shoulder gives her a fit at night hurting     HPI Kimberly Rocha is a 45 year old female with PMH of PE (11/2019), seizure disorder, ?herpes simplex, anxiety with depression and COPD who presents for establishing care.  She had PE and has been on Eliquis since. She denies any leg swelling, chest pain, dyspnea or palpitations currently. She has an appointment with Dr Melvyn Novas. She did not report any prolonged immobilization at that time, but has not had hypercoagulable workup since then.  She has h/o seizure disorder (around 03/2020) and has been taking Topiramate since then. Denies any seizure episode since then. She follows up with Dr Merlene Laughter for it.  She has been taking Acyclovir for possible HSV infection. She had diffuse rash and was told that she had widespread HSV rash. Of note, she had outbreaks of rash while taking Acyclovir, and responded to steroid. She currently has mild redness over b/l legs and has been itching.  She has h/o anxiety and depression and states that Remeron has not been helping. She is willing to gain weight. Denies any SI or HI.  She has noticed mild swelling/bump between her breasts. She has not her Mammography yet. Although the bump appears to be superficial.  She c/o left shoulder area pain, which radiates to lateral aspect of left arm. Pain is worse at the end of the day and is better with rest, tylenol and muscle relaxer. Denies any recent  injury.  She has had 2 doses of COVID vaccine.  Past Medical History:  Diagnosis Date   Borderline high cholesterol    Borderline hypertension    Gallstones     Past Surgical History:  Procedure Laterality Date   CESAREAN SECTION     CHOLECYSTECTOMY N/A 08/22/2012   Procedure: LAPAROSCOPIC CHOLECYSTECTOMY;  Surgeon: Jamesetta So, MD;  Location: [facility];  Service: General;  Laterality: N/A;   HERNIA REPAIR     TUBAL LIGATION      History reviewed. No pertinent family history.  Social History   Socioeconomic History   Marital status: Single    Spouse name: Not on file   Number of children: Not on file   Years of education: Not on file   Highest education level: Not on file  Occupational History   Not on file  Tobacco Use   Smoking status: Former    Packs/day: 0.50    Pack years: 0.00    Types: Cigarettes   Smokeless tobacco: Never  Substance and Sexual Activity   Alcohol use: Not Currently   Drug use: Yes    Frequency: 3.0 times per week    Types: Marijuana    Comment: hx marijuana, "edibles" in the past    Sexual activity: Yes    Birth control/protection: Surgical  Other Topics Concern   Not on file  Social History Narrative   Not on file  Social Determinants of Health   Financial Resource Strain: Not on file  Food Insecurity: Not on file  Transportation Needs: Not on file  Physical Activity: Not on file  Stress: Not on file  Social Connections: Not on file  Intimate Partner Violence: Not on file    ROS Review of Systems  Constitutional:  Negative for chills and fever.  HENT:  Negative for congestion, sinus pressure, sinus pain and sore throat.   Eyes:  Negative for pain and discharge.  Respiratory:  Negative for cough and shortness of breath.   Cardiovascular:  Negative for chest pain and palpitations.  Gastrointestinal:  Negative for abdominal pain, constipation, diarrhea, nausea and vomiting.  Endocrine: Negative for polydipsia and polyuria.   Genitourinary:  Negative for dysuria and hematuria.  Musculoskeletal:  Negative for neck pain and neck stiffness.  Skin:  Negative for rash.  Neurological:  Negative for dizziness and weakness.  Psychiatric/Behavioral:  Positive for dysphoric mood and sleep disturbance. Negative for agitation and behavioral problems. The patient is nervous/anxious.    Objective:   Today's Vitals: BP 119/88 (BP Location: Left Arm, Patient Position: Sitting, Cuff Size: Normal)   Pulse 97   Temp 98.2 F (36.8 C) (Oral)   Resp 18   Ht 5' (1.524 m)   Wt 111 lb 0.6 oz (50.4 kg)   SpO2 99%   BMI 21.69 kg/m   Physical Exam Vitals reviewed.  Constitutional:      General: She is not in acute distress.    Appearance: She is not diaphoretic.  HENT:     Head: Normocephalic and atraumatic.     Nose: Nose normal.     Mouth/Throat:     Mouth: Mucous membranes are moist.  Eyes:     General: No scleral icterus.    Extraocular Movements: Extraocular movements intact.  Cardiovascular:     Rate and Rhythm: Normal rate and regular rhythm.     Pulses: Normal pulses.     Heart sounds: Normal heart sounds. No murmur heard. Pulmonary:     Breath sounds: Normal breath sounds. No wheezing or rales.  Chest:     Comments: About 0.5 cm superficial mass over lower sternal area, mobile Abdominal:     Palpations: Abdomen is soft.     Tenderness: There is no abdominal tenderness.  Musculoskeletal:     Cervical back: Neck supple. No tenderness.     Right lower leg: No edema.     Left lower leg: No edema.  Skin:    General: Skin is warm.     Findings: No rash.  Neurological:     General: No focal deficit present.     Mental Status: She is alert and oriented to person, place, and time.  Psychiatric:        Mood and Affect: Mood normal.        Behavior: Behavior normal.    Assessment & Plan:   Problem List Items Addressed This Visit       Encounter to establish care - Primary   Care established Previous  chart reviewed History and medications reviewed with the patient      Relevant Orders  CBC with Differential/Platelet  CMP14+EGFR  Lipid panel  TSH    Respiratory   Pulmonary emphysema (HCC)    Noted on CT chest No dyspnea/wheezing currently       Relevant Medications   methylPREDNISolone (MEDROL DOSEPAK) 4 MG TBPK tablet     Musculoskeletal and Integument   Eczema  Reports diffuse rash got better with steroids, which points to allergic etiology more than herpes. Refilled Sterapred for now Trial of Kenalog       Relevant Medications   methylPREDNISolone (MEDROL DOSEPAK) 4 MG TBPK tablet   triamcinolone cream (KENALOG) 0.1 %     Other   History of pulmonary embolus (PE)    In 11/2019, CT chest reviewed Follow up CT chest negative for PE No provoking concern at that time, but could be acute viral illness leading to hypercoagulable state On Eliquis currently Going to see Pulmonologist          Anxiety with depression    Uncontrolled with Remeron 45 mg qHS and Vistaril 50 mg TID PRN Added Lexapro 5 mg QD Referred to [facility] therapist       Relevant Medications   escitalopram (LEXAPRO) 5 MG tablet   Other Relevant Orders   TSH   Ambulatory referral to Psychiatry   History of herpes simplex infection    Unclear whether she had just exposure vs outbreaks, reports diffuse rash On Acyclovir Check HSV antibodies Plan to discontinue Acyclovir in the next visit       Relevant Orders   HSV(herpes simplex vrs) 1+2 ab-IgG   Other Visit Diagnoses     Need for hepatitis C screening test       Relevant Orders   Hepatitis C Antibody   Chronic left shoulder pain       Relevant Medications   topiramate (TOPAMAX) 25 MG tablet   escitalopram (LEXAPRO) 5 MG tablet   methylPREDNISolone (MEDROL DOSEPAK) 4 MG TBPK tablet   cyclobenzaprine (FLEXERIL) 10 MG tablet   Cervical cancer screening       Relevant Orders   Ambulatory referral to Obstetrics / Gynecology    Screening mammogram for breast cancer       Relevant Orders   MM 3D SCREEN BREAST BILATERAL       Outpatient Encounter Medications as of 07/18/2020  Medication Sig   acyclovir (ZOVIRAX) 400 MG tablet Take 400 mg by mouth 2 (two) times daily.   APIXABAN (ELIQUIS) VTE STARTER PACK (10MG AND 5MG) Take as directed on package: start with two-56m tablets twice daily for 7 days. On day 8, switch to one-58mtablet twice daily.   cyclobenzaprine (FLEXERIL) 10 MG tablet Take 1 tablet (10 mg total) by mouth 3 (three) times daily as needed for muscle spasms.   DIALYVITE VITAMIN D3 MAX 1.25 MG (50000 UT) TABS Take 1 tablet by mouth once a week.   escitalopram (LEXAPRO) 5 MG tablet Take 1 tablet (5 mg total) by mouth daily.   hydrOXYzine (VISTARIL) 50 MG capsule Take 50 mg by mouth 3 (three) times daily as needed.   methylPREDNISolone (MEDROL DOSEPAK) 4 MG TBPK tablet Take as package instructions.   mirtazapine (REMERON) 45 MG tablet Take 45 mg by mouth at bedtime.   topiramate (TOPAMAX) 25 MG tablet Take 25 mg by mouth 2 (two) times daily.   triamcinolone cream (KENALOG) 0.1 % Apply 1 application topically 2 (two) times daily.   [DISCONTINUED] nicotine (NICODERM CQ - DOSED IN MG/24 HOURS) 14 mg/24hr patch Place 1 patch (14 mg total) onto the skin daily. (Patient not taking: Reported on 07/18/2020)   [DISCONTINUED] omeprazole (PRILOSEC) 20 MG capsule Take 1 capsule (20 mg total) by mouth daily. (Patient not taking: Reported on 07/18/2020)   No facility-administered encounter medications on file as of 07/18/2020.    Follow-up: Return in about 4 months (around 11/17/2020).  Valera Vallas K Shavanna Furnari, MD  

## 2020-07-18 NOTE — Assessment & Plan Note (Signed)
Noted on CT chest No dyspnea/wheezing currently

## 2020-07-18 NOTE — Patient Instructions (Signed)
Please start using Triamcinolone cream for itchy rash.  Please continue to take other medications as prescribed.  Please perform simple shoulder exercises. Okay to take Tylenol up to 3 times in a day for pain. Take Flexeril for muscle spasms.  You are being referred to Ob/Gyn for PAP smear.  You are being referred to Behavioral health therapist.

## 2020-07-18 NOTE — Assessment & Plan Note (Signed)
Uncontrolled with Remeron 45 mg qHS and Vistaril 50 mg TID PRN Added Lexapro 5 mg QD Referred to [facility] therapist

## 2020-07-18 NOTE — Assessment & Plan Note (Signed)
Unclear whether she had just exposure vs outbreaks, reports diffuse rash On Acyclovir Check HSV antibodies Plan to discontinue Acyclovir in the next visit

## 2020-07-18 NOTE — Assessment & Plan Note (Signed)
Reports diffuse rash got better with steroids, which points to allergic etiology more than herpes. Refilled Sterapred for now Trial of Kenalog

## 2020-07-24 ENCOUNTER — Institutional Professional Consult (permissible substitution): Payer: Self-pay | Admitting: Internal Medicine

## 2020-07-24 NOTE — Progress Notes (Deleted)
Kimberly Rocha, female    DOB: 1975-07-24,    MRN: [id number]   Brief patient profile:    Admit date: 11/12/2019 Discharge date: 11/13/2019 History of present illness:  Ms. Kimberly Rocha is a 45 year old female with medical history significant for tobacco use, anxiety, GERD who presents to the ED on 10/17 with sudden onset of severe right-sided chest pain with radiation to the back that worsened with breathing in the setting of recent flight to [facility] (approximately 7 hours including to and from her destination).  Patient was admitted with provoked acute PE. In the ED patient was hemodynamically stable, normal oxygen saturation 95% on room air, troponin trend x2 unremarkable, lab work unremarkable with no ischemic changes on EKG.  CTA was consistent with subsegmental embolus of the right lower lobe and right upper lobe with wedge-shaped opacity consistent with pulmonary infarction.  Patient underwent TTE which showed EF of 65-70% and normal right ventricular systolic function.  Bilateral venous duplex was negative for DVTs.  Patient was able to ambulate without any need for supplemental oxygen.  Patient transitioned from Lovenox to Eliquis on discharge with strict return precautions regarding travel.Remaining hospital course addressed in problem based format below:   Hospital Course:      Acute right-sided PE and associated pulmonary infarct, provoked.  In the setting of prolonged immobilization while flying.  Otherwise patient has no other risk factors (no family history, no hormonal use, patient does have history of tobacco use).  TTE showed preserved EF.  Patient maintained normal oxygen saturations.  Troponin unremarkable.  DVT venous duplex unremarkable.  Pleuritic chest pain likely due to pulmonary infarct well-controlled on oral pain medications -Patient was transitioned from IV Lovenox to Eliquis and provided prescription on discharge-strict return precautions provided -Cessation of tobacco  use -Close follow-up with PCP -Short course of tramadol as needed severe pain   Tobacco use.  Smokes half pack a day.  Counseled on quitting during hospitalization -Patient is ready for plan to quit -Nicotine patch on discharge     History of Present Illness  07/24/2020  Pulmonary/ 1st office eval/ Kimberly Rocha / [place] [facility] s/p provoked PE 10/2019 with neg venous doppler and nl R ht pressures No chief complaint on file.    Dyspnea:  *** Cough: *** Sleep: *** SABA use:   Past Medical History:  Diagnosis Date   Borderline high cholesterol    Borderline hypertension    Gallstones     Outpatient Medications Prior to Visit  Medication Sig Dispense Refill   acyclovir (ZOVIRAX) 400 MG tablet Take 400 mg by mouth 2 (two) times daily.     APIXABAN (ELIQUIS) VTE STARTER PACK (10MG  AND 5MG ) Take as directed on package: start with two-5mg  tablets twice daily for 7 days. On day 8, switch to one-5mg  tablet twice daily. 1 each 0   cyclobenzaprine (FLEXERIL) 10 MG tablet Take 1 tablet (10 mg total) by mouth 3 (three) times daily as needed for muscle spasms. 30 tablet 0   DIALYVITE VITAMIN D3 MAX 1.25 MG (50000 UT) TABS Take 1 tablet by mouth once a week.     escitalopram (LEXAPRO) 5 MG tablet Take 1 tablet (5 mg total) by mouth daily. 30 tablet 2   hydrOXYzine (VISTARIL) 50 MG capsule Take 50 mg by mouth 3 (three) times daily as needed.     methylPREDNISolone (MEDROL DOSEPAK) 4 MG TBPK tablet Take as package instructions. 1 each 0   mirtazapine (REMERON) 45 MG tablet Take 45 mg by  mouth at bedtime.     topiramate (TOPAMAX) 25 MG tablet Take 25 mg by mouth 2 (two) times daily.     triamcinolone cream (KENALOG) 0.1 % Apply 1 application topically 2 (two) times daily. 30 g 0   No facility-administered medications prior to visit.     Objective:     There were no vitals taken for this visit.         Assessment   No problem-specific Assessment & Plan notes found for this  encounter.     Sandrea Hughs, MD 07/24/2020

## 2020-07-25 ENCOUNTER — Institutional Professional Consult (permissible substitution): Payer: Self-pay | Admitting: Internal Medicine

## 2020-07-27 LAB — LIPID PANEL
Chol/HDL Ratio: 5 ratio — ABNORMAL HIGH (ref 0.0–4.4)
Cholesterol, Total: 233 mg/dL — ABNORMAL HIGH (ref 100–199)
HDL: 47 mg/dL (ref >39)
LDL Chol Calc (NIH): 175 mg/dL — ABNORMAL HIGH (ref 0–99)
Triglycerides: 63 mg/dL (ref 0–149)
VLDL Cholesterol Cal: 11 mg/dL (ref 5–40)

## 2020-07-27 LAB — CBC WITH DIFFERENTIAL/PLATELET
Basophils Absolute: 0 10*3/uL (ref 0.0–0.2)
Basos: 0 %
EOS (ABSOLUTE): 0 10*3/uL (ref 0.0–0.4)
Eos: 0 %
Hematocrit: 39.3 % (ref 34.0–46.6)
Hemoglobin: 13.5 g/dL (ref 11.1–15.9)
Immature Grans (Abs): 0 10*3/uL (ref 0.0–0.1)
Immature Granulocytes: 0 %
Lymphocytes Absolute: 1.8 10*3/uL (ref 0.7–3.1)
Lymphs: 16 %
MCH: 33.7 pg — ABNORMAL HIGH (ref 26.6–33.0)
MCHC: 34.4 g/dL (ref 31.5–35.7)
MCV: 98 fL — ABNORMAL HIGH (ref 79–97)
Monocytes Absolute: 0.4 10*3/uL (ref 0.1–0.9)
Monocytes: 3 %
Neutrophils Absolute: 9.3 10*3/uL — ABNORMAL HIGH (ref 1.4–7.0)
Neutrophils: 81 %
Platelets: 214 10*3/uL (ref 150–450)
RBC: 4.01 x10E6/uL (ref 3.77–5.28)
RDW: 12.6 % (ref 11.7–15.4)
WBC: 11.5 10*3/uL — ABNORMAL HIGH (ref 3.4–10.8)

## 2020-07-27 LAB — CMP14+EGFR
ALT: 8 IU/L (ref 0–32)
AST: 11 IU/L (ref 0–40)
Albumin/Globulin Ratio: 1.7 (ref 1.2–2.2)
Albumin: 4.5 g/dL (ref 3.8–4.8)
Alkaline Phosphatase: 79 IU/L (ref 44–121)
BUN/Creatinine Ratio: 8 — ABNORMAL LOW (ref 9–23)
BUN: 7 mg/dL (ref 6–24)
Bilirubin Total: 0.3 mg/dL (ref 0.0–1.2)
CO2: 23 mmol/L (ref 20–29)
Calcium: 10.3 mg/dL — ABNORMAL HIGH (ref 8.7–10.2)
Chloride: 98 mmol/L (ref 96–106)
Creatinine, Ser: 0.83 mg/dL (ref 0.57–1.00)
Globulin, Total: 2.6 g/dL (ref 1.5–4.5)
Glucose: 109 mg/dL — ABNORMAL HIGH (ref 65–99)
Potassium: 4.5 mmol/L (ref 3.5–5.2)
Sodium: 143 mmol/L (ref 134–144)
Total Protein: 7.1 g/dL (ref 6.0–8.5)
eGFR: 89 mL/min/{1.73_m2} (ref >59)

## 2020-07-27 LAB — TSH: TSH: 0.326 u[IU]/mL — ABNORMAL LOW (ref 0.450–4.500)

## 2020-07-27 LAB — HSV(HERPES SIMPLEX VRS) I + II AB-IGG
HSV 1 Glycoprotein G Ab, IgG: 55 index — ABNORMAL HIGH (ref 0.00–0.90)
HSV 2 IgG, Type Spec: 5.52 index — ABNORMAL HIGH (ref 0.00–0.90)

## 2020-07-27 LAB — HEPATITIS C ANTIBODY: Hep C Virus Ab: <0.1 s/co ratio (ref 0.0–0.9)

## 2020-07-29 ENCOUNTER — Other Ambulatory Visit: Payer: Self-pay | Admitting: Internal Medicine

## 2020-07-29 DIAGNOSIS — E782 Mixed hyperlipidemia: Secondary | ICD-10-CM

## 2020-07-29 DIAGNOSIS — E0590 Thyrotoxicosis, unspecified without thyrotoxic crisis or storm: Secondary | ICD-10-CM

## 2020-07-29 DIAGNOSIS — D72829 Elevated white blood cell count, unspecified: Secondary | ICD-10-CM

## 2020-07-29 MED ORDER — ROSUVASTATIN CALCIUM 10 MG PO TABS: 10.0000 mg | ORAL_TABLET | Freq: Every day | ORAL | 1 refills | Status: DC

## 2020-07-30 ENCOUNTER — Other Ambulatory Visit: Payer: Self-pay

## 2020-07-30 ENCOUNTER — Telehealth: Payer: Self-pay

## 2020-08-15 ENCOUNTER — Ambulatory Visit (INDEPENDENT_AMBULATORY_CARE_PROVIDER_SITE_OTHER): Payer: Self-pay | Admitting: Internal Medicine

## 2020-08-15 ENCOUNTER — Encounter: Payer: Self-pay | Admitting: Internal Medicine

## 2020-08-15 ENCOUNTER — Other Ambulatory Visit: Payer: Self-pay

## 2020-08-15 DIAGNOSIS — R0609 Other forms of dyspnea: Secondary | ICD-10-CM | POA: Insufficient documentation

## 2020-08-15 DIAGNOSIS — R0600 Dyspnea, unspecified: Secondary | ICD-10-CM

## 2020-08-15 NOTE — Assessment & Plan Note (Addendum)
Onset p trip to  [facility] Oct [id number] fatigue and sob and p returned to [place] > ER  11/12/19 segmental PE  on R but neg venous dopplers and nl echo  -  08/15/2020   Walked RA  approx   450  ft  @ very fast pace  stopped due to  End of study, some sob on last laps but lowest sats 97%   Pattern is one of severe deconditioning following what would not have typically been a significant PE in terms of DOE based on above findings and certainly nothing here to support TEPAH so my main recs  1) maintain off cigs 2) sub max ex min of 30 min daily  3) Spirometry only  - no need for full pfts 4) prophylactic dose eliquis at this point but would not stop completely due to concerns with h/o PE in both parents as no other obvious risk factors here (other than jet travel to [facility])   F/u can be prn once get spirometry to reassure this is not copd.   Each maintenance medication was reviewed in detail including emphasizing most importantly the difference between maintenance and prns and under what circumstances the prns are to be triggered using an action plan format where appropriate.  Total time for H and P, chart review, counseling,  directly observing portions of ambulatory 02 saturation study/ and generating customized AVS unique to this office visit / same day charting > 30 min

## 2020-08-15 NOTE — Progress Notes (Signed)
Kimberly Rocha, female    DOB: [birth year],  MRN: [id number]   Brief patient profile:  44 yobf  quit smoking 2021 p while in [facility] Oct [id number] fatigue and sob and p returned to [place] > ER  11/12/19 segmental PE  on R but neg venous dopplers and nl echo and breathing "never right since" so referred to pulmonary clinic in [place]  08/15/2020 by Dr    Allena Katz  Never able to establish level of ex tol prior to trip to [facility] " I just ignored it"    History of Present Illness  08/15/2020  Pulmonary/ 1st office eval/ Izzabell Klasen / [place] [facility]  Chief Complaint  Patient presents with   Consult    Patient reports that she has felt worse since PE, and she feels tired even getting  a shower,   Dyspnea:  no shopping / uses [facility] parking from her mother when goes out, very sedentary.  Cough:  sometimes dry cough before bed, rarely hs  Sleep: flat in bed / one pillow  SABA use: none Blood clots in both parents   No obvious day to day or daytime variability or assoc excess/ purulent sputum or mucus plugs or hemoptysis or cp or chest tightness, subjective wheeze or overt sinus or hb symptoms.   Sleeping  without nocturnal  or early am exacerbation  of respiratory  c/o's or need for noct saba. Also denies any obvious fluctuation of symptoms with weather or environmental changes or other aggravating or alleviating factors except as outlined above   No unusual exposure hx or h/o childhood pna/ asthma or knowledge of premature birth.  Current Allergies, Complete Past Medical History, Past Surgical History, Family History, and Social History were reviewed in [employer] record.  ROS  The following are not active complaints unless bolded Hoarseness, sore throat, dysphagia, dental problems, itching, sneezing,  nasal congestion or discharge of excess mucus or purulent secretions, ear ache,   fever, chills, sweats, unintended wt loss or wt gain, classically pleuritic or exertional cp,   orthopnea pnd or arm/hand swelling  or leg swelling, presyncope, palpitations, abdominal pain, anorexia, nausea, vomiting, diarrhea  or change in bowel habits or change in bladder habits, change in stools or change in urine, dysuria, hematuria,  rash, arthralgias, visual complaints, headache, numbness, weakness or ataxia or problems with walking or coordination,  change in mood (mother died from emphysema 2020-08-07) or  memory.           Past Medical History:  Diagnosis Date   Borderline high cholesterol    Borderline hypertension    Gallstones     Outpatient Medications Prior to Visit  Medication Sig Dispense Refill   acyclovir (ZOVIRAX) 400 MG tablet Take 400 mg by mouth 2 (two) times daily.     apixaban (ELIQUIS) 5 MG TABS tablet Take 5 mg by mouth 2 (two) times daily.     cyclobenzaprine (FLEXERIL) 10 MG tablet Take 1 tablet (10 mg total) by mouth 3 (three) times daily as needed for muscle spasms. 30 tablet 0   DIALYVITE VITAMIN D3 MAX 1.25 MG (50000 UT) TABS Take 1 tablet by mouth once a week.     escitalopram (LEXAPRO) 5 MG tablet Take 1 tablet (5 mg total) by mouth daily. 30 tablet 2   hydrOXYzine (VISTARIL) 50 MG capsule Take 50 mg by mouth 3 (three) times daily as needed.     mirtazapine (REMERON) 45 MG tablet Take 45 mg  by mouth at bedtime.     rosuvastatin (CRESTOR) 10 MG tablet Take 1 tablet (10 mg total) by mouth daily. 90 tablet 1   topiramate (TOPAMAX) 25 MG tablet Take 25 mg by mouth 2 (two) times daily.     triamcinolone cream (KENALOG) 0.1 % Apply 1 application topically 2 (two) times daily. 30 g 0   APIXABAN (ELIQUIS) VTE STARTER PACK (10MG  AND 5MG ) Take as directed on package: start with two-5mg  tablets twice daily for 7 days. On day 8, switch to one-5mg  tablet twice daily. (Patient not taking: Reported on 08/15/2020) 1 each 0   methylPREDNISolone (MEDROL DOSEPAK) 4 MG TBPK tablet Take as package instructions. (Patient not taking: Reported on 08/15/2020) 1 each 0   No  facility-administered medications prior to visit.     Objective:     BP 110/62 (BP Location: Left Arm, Patient Position: Sitting, Cuff Size: Normal)   Pulse (!) 101   Temp 98.9 F (37.2 C) (Oral)   Ht 5' (1.524 m)   Wt 112 lb (50.8 kg)   SpO2 99% Comment: Room air  BMI 21.87 kg/m   SpO2: 99 % (Room air)  Somber bf has trouble articulating symptoms despite prior nursing training    HEENT : pt wearing mask not removed for exam due to covid -19 concerns.    NECK :  without JVD/Nodes/TM/ nl carotid upstrokes bilaterally   LUNGS: no acc muscle use,  Nl contour chest which is clear to A and P bilaterally without cough on insp or exp maneuvers   CV:  RRR  no s3 or murmur or increase in P2, and no edema   ABD:  soft and nontender with nl inspiratory excursion in the supine position. No bruits or organomegaly appreciated, bowel sounds nl  MS:  Nl gait/ ext warm without deformities, calf tenderness, cyanosis or clubbing No obvious joint restrictions   SKIN: warm and dry without lesions    NEURO:  alert, approp, nl sensorium with  no motor or cerebellar deficits apparent.     I personally reviewed images and agree with radiology impression as follows:   Chest CTa 04/21/20  1. Negative for pulmonary embolism. 2. Subtle mosaic attenuation of the upper to mid lung fields, which may represent air trapping or small airways disease.    Assessment   DOE p PE Oct 2021 Onset p trip to  [facility] Oct Nov 2021 fatigue and sob and p returned to [place] > ER  11/12/19 segmental PE  on R but neg venous dopplers and nl echo  -  08/15/2020   Walked RA  approx   450  ft  @ very fast pace  stopped due to  End of study, some sob on last laps but lowest sats 97%   Pattern is one of severe deconditioning following what would not have typically been a significant PE in terms of DOE based on above findings and certainly nothing here to support TEPAH so my main recs  1) maintain off cigs 2) sub max ex  min of 30 min daily  3) Spirometry only  - no need for full pfts 4) prophylactic dose eliquis at this point but would not stop completely due to concerns with h/o PE in both parents as no other obvious risk factors here (other than jet travel to [facility])   F/u can be prn once get spirometry to reassure this is not copd.   Each maintenance medication was reviewed in detail including emphasizing most  importantly the difference between maintenance and prns and under what circumstances the prns are to be triggered using an action plan format where appropriate.  Total time for H and P, chart review, counseling,  directly observing portions of ambulatory 02 saturation study/ and generating customized AVS unique to this office visit / same day charting > 30 min                    Sandrea Hughs, MD 08/15/2020

## 2020-08-15 NOTE — Patient Instructions (Addendum)
I would recommend you stay on low dose eliquis (2.5 mg twice daily)  until you see a blood clotting specialist (hematology) which can be arranged thru your PCP (no rush)  since your family history is positive and you have no other obvious risk factors  We will arrange for you to have spirometry to prove you don't have copd and I will see you after this.   To get the most out of exercise, you need to be continuously aware that you are short of breath, but never out of breath, for at least 30 minutes daily. As you improve, it will actually be easier for you to do the same amount of exercise  in  30 minutes so always push to the level where you are short of breath.  Once you can do this, push for longer duration or repeat it after at least 4 hours of rest.

## 2020-08-21 ENCOUNTER — Encounter: Payer: Self-pay | Admitting: Internal Medicine

## 2020-08-22 ENCOUNTER — Ambulatory Visit (INDEPENDENT_AMBULATORY_CARE_PROVIDER_SITE_OTHER): Payer: Self-pay | Admitting: Nurse Practitioner

## 2020-08-22 ENCOUNTER — Other Ambulatory Visit: Payer: Self-pay

## 2020-08-22 ENCOUNTER — Encounter: Payer: Self-pay | Admitting: Nurse Practitioner

## 2020-08-22 DIAGNOSIS — R21 Rash and other nonspecific skin eruption: Secondary | ICD-10-CM

## 2020-08-22 MED ORDER — VALACYCLOVIR HCL 1 G PO TABS: 1000.0000 mg | ORAL_TABLET | Freq: Three times a day (TID) | ORAL | 0 refills | Status: AC

## 2020-08-22 MED ORDER — VALACYCLOVIR HCL 1 G PO TABS: 1000.0000 mg | ORAL_TABLET | Freq: Three times a day (TID) | ORAL | 0 refills | Status: DC

## 2020-08-22 NOTE — Patient Instructions (Signed)
If no improvement in 1 week, let me know and we will consider dermatology referral.

## 2020-08-22 NOTE — Telephone Encounter (Signed)
Appointment scheduled patient would like to come in.

## 2020-08-22 NOTE — Progress Notes (Signed)
Acute Office Visit  Subjective:    Patient ID: Kimberly Rocha, female    DOB: Mar 07, 1975, 45 y.o.   MRN: [id number]  Chief Complaint  Patient presents with   Rash    Ongoing since Monday, has been happening intermittently over the past few months. Itches and redness mainly on arms and legs.     Rash  Patient is in today for rash.  Past Medical History:  Diagnosis Date   Borderline high cholesterol    Borderline hypertension    Gallstones     Past Surgical History:  Procedure Laterality Date   CESAREAN SECTION     CHOLECYSTECTOMY N/A 08/22/2012   Procedure: LAPAROSCOPIC CHOLECYSTECTOMY;  Surgeon: Jamesetta So, MD;  Location: [facility];  Service: General;  Laterality: N/A;   HERNIA REPAIR     TUBAL LIGATION      History reviewed. No pertinent family history.  Social History   Socioeconomic History   Marital status: Single    Spouse name: Not on file   Number of children: Not on file   Years of education: Not on file   Highest education level: Not on file  Occupational History   Not on file  Tobacco Use   Smoking status: Former    Packs/day: 0.50    Types: Cigarettes   Smokeless tobacco: Never  Substance and Sexual Activity   Alcohol use: Not Currently   Drug use: Yes    Frequency: 3.0 times per week    Types: Marijuana    Comment: hx marijuana, "edibles" in the past    Sexual activity: Yes    Birth control/protection: Surgical  Other Topics Concern   Not on file  Social History Narrative   Not on file   Social Determinants of Health   Financial Resource Strain: Not on file  Food Insecurity: Not on file  Transportation Needs: Not on file  Physical Activity: Not on file  Stress: Not on file  Social Connections: Not on file  Intimate Partner Violence: Not on file    Outpatient Medications Prior to Visit  Medication Sig Dispense Refill   apixaban (ELIQUIS) 5 MG TABS tablet Take 5 mg by mouth 2 (two) times daily.     cyclobenzaprine (FLEXERIL) 10  MG tablet Take 1 tablet (10 mg total) by mouth 3 (three) times daily as needed for muscle spasms. 30 tablet 0   DIALYVITE VITAMIN D3 MAX 1.25 MG (50000 UT) TABS Take 1 tablet by mouth once a week.     escitalopram (LEXAPRO) 5 MG tablet Take 1 tablet (5 mg total) by mouth daily. 30 tablet 2   hydrOXYzine (VISTARIL) 50 MG capsule Take 50 mg by mouth 3 (three) times daily as needed.     mirtazapine (REMERON) 45 MG tablet Take 45 mg by mouth at bedtime.     rosuvastatin (CRESTOR) 10 MG tablet Take 1 tablet (10 mg total) by mouth daily. 90 tablet 1   topiramate (TOPAMAX) 25 MG tablet Take 25 mg by mouth 2 (two) times daily.     triamcinolone cream (KENALOG) 0.1 % Apply 1 application topically 2 (two) times daily. 30 g 0   acyclovir (ZOVIRAX) 400 MG tablet Take 400 mg by mouth 2 (two) times daily.     No facility-administered medications prior to visit.    Allergies  Allergen Reactions   Demerol Hives and Itching    Review of Systems  Constitutional: Negative.   Skin:  Positive for rash.  Burns and itches; started a week ago      Objective:    Physical Exam Constitutional:      Appearance: Normal appearance.  Skin:    Findings: Rash present.     Comments: macropapular and erythematous with some small vesicles; rash to hands, lower legs and left chest near her ribs (looks like burst vesicles here)  Neurological:     Mental Status: She is alert.    BP 104/72 (BP Location: Left Arm, Patient Position: Sitting, Cuff Size: Normal)   Pulse (!) 115   Ht 5' (1.524 m)   Wt 107 lb (48.5 kg)   LMP 07/31/2020 (Exact Date)   SpO2 97%   BMI 20.90 kg/m  Wt Readings from Last 3 Encounters:  08/22/20 107 lb (48.5 kg)  08/15/20 112 lb (50.8 kg)  07/18/20 111 lb 0.6 oz (50.4 kg)    Health Maintenance Due  Topic Date Due   TETANUS/TDAP  Never done   PAP SMEAR-Modifier  Never done   COLONOSCOPY (Pts 45-81yrs Insurance coverage will need to be confirmed)  Never done    There are no  preventive care reminders to display for this patient.   Lab Results  Component Value Date   TSH 0.326 (L) 07/25/2020   Lab Results  Component Value Date   WBC 11.5 (H) 07/25/2020   HGB 13.5 07/25/2020   HCT 39.3 07/25/2020   MCV 98 (H) 07/25/2020   PLT 214 07/25/2020   Lab Results  Component Value Date   NA 143 07/25/2020   K 4.5 07/25/2020   CO2 23 07/25/2020   GLUCOSE 109 (H) 07/25/2020   BUN 7 07/25/2020   CREATININE 0.83 07/25/2020   BILITOT 0.3 07/25/2020   ALKPHOS 79 07/25/2020   AST 11 07/25/2020   ALT 8 07/25/2020   PROT 7.1 07/25/2020   ALBUMIN 4.5 07/25/2020   CALCIUM 10.3 (H) 07/25/2020   ANIONGAP 8 04/21/2020   EGFR 89 07/25/2020   Lab Results  Component Value Date   CHOL 233 (H) 07/25/2020   Lab Results  Component Value Date   HDL 47 07/25/2020   Lab Results  Component Value Date   LDLCALC 175 (H) 07/25/2020   Lab Results  Component Value Date   TRIG 63 07/25/2020   Lab Results  Component Value Date   CHOLHDL 5.0 (H) 07/25/2020   No results found for: HGBA1C     Assessment & Plan:   Problem List Items Addressed This Visit       Musculoskeletal and Integument   Rash    -unsure of etiology -recent outdoor work, no changes to detergent or soaps -states it is burning and itching; macropapular and erythematous with some small vesicles -she has hx of herpes infection -Rx. valcyclovir -she has been using hydrocortisone suppositories and topical triamcinolone at home, and that isn't helping -if no improvement, will consider dermatology referral         Meds ordered this encounter  Medications   DISCONTD: valACYclovir (VALTREX) 1000 MG tablet    Sig: Take 1 tablet (1,000 mg total) by mouth 3 (three) times daily for 7 days.    Dispense:  20 tablet    Refill:  0   valACYclovir (VALTREX) 1000 MG tablet    Sig: Take 1 tablet (1,000 mg total) by mouth 3 (three) times daily for 7 days.    Dispense:  21 tablet    Refill:  0     Disregard previous rx; need #21 (not 20)  Noreene Larsson, NP

## 2020-08-22 NOTE — Assessment & Plan Note (Addendum)
-  unsure of etiology -recent outdoor work, no changes to detergent or soaps -states it is burning and itching; macropapular and erythematous with some small vesicles -she has hx of herpes infection -Rx. valcyclovir -she has been using hydrocortisone suppositories and topical triamcinolone at home, and that isn't helping -if no improvement, will consider dermatology referral

## 2020-08-25 ENCOUNTER — Emergency Department (HOSPITAL_COMMUNITY)
Admission: EM | Admit: 2020-08-25 | Discharge: 2020-08-26 | Disposition: A | Discharge status: Home / Self Care | Payer: Medicaid Other | Attending: Emergency Medicine | Admitting: Emergency Medicine

## 2020-08-25 ENCOUNTER — Other Ambulatory Visit: Payer: Self-pay

## 2020-08-25 DIAGNOSIS — N898 Other specified noninflammatory disorders of vagina: Secondary | ICD-10-CM | POA: Insufficient documentation

## 2020-08-25 DIAGNOSIS — Z5321 Procedure and treatment not carried out due to patient leaving prior to being seen by health care provider: Secondary | ICD-10-CM | POA: Insufficient documentation

## 2020-08-25 DIAGNOSIS — R21 Rash and other nonspecific skin eruption: Secondary | ICD-10-CM | POA: Insufficient documentation

## 2020-08-26 ENCOUNTER — Other Ambulatory Visit: Payer: Self-pay

## 2020-08-26 NOTE — ED Notes (Signed)
RN attempted to call Pts cellphone without success. Pts voicemail box is full at this.

## 2020-08-26 NOTE — ED Notes (Signed)
Called for Pt from Waiting Room X 2 . No answer

## 2020-08-26 NOTE — ED Triage Notes (Signed)
Pt c/o of rash on arms and legs as well as vaginal discharge for about a week.

## 2020-08-26 NOTE — ED Notes (Signed)
Pt called from Waiting room. No answer. Pt not located.

## 2020-08-27 ENCOUNTER — Other Ambulatory Visit: Payer: Self-pay | Admitting: Nurse Practitioner

## 2020-08-27 ENCOUNTER — Encounter: Payer: Self-pay | Admitting: Nurse Practitioner

## 2020-08-27 DIAGNOSIS — R21 Rash and other nonspecific skin eruption: Secondary | ICD-10-CM

## 2020-08-27 MED ORDER — PREDNISONE 10 MG PO TABS: ORAL_TABLET | ORAL | 0 refills | Status: AC

## 2020-08-27 NOTE — Progress Notes (Signed)
-  rash has not improved -Rx. Prednisone -referral to derm

## 2020-08-28 NOTE — Telephone Encounter (Signed)
Let's set up an appointment. She has hydroxyzine on her med list, but I need to go over what she takes and she may need therapy/psych referral, so appointment would be more appropriate.

## 2020-08-29 ENCOUNTER — Other Ambulatory Visit: Payer: Self-pay

## 2020-08-29 ENCOUNTER — Telehealth: Payer: Self-pay | Admitting: Licensed Clinical Social Worker

## 2020-08-29 DIAGNOSIS — F418 Other specified anxiety disorders: Secondary | ICD-10-CM

## 2020-08-30 LAB — TSH+T4F+T3FREE
Free T4: 1.06 ng/dL (ref 0.82–1.77)
T3, Free: 1.6 pg/mL — ABNORMAL LOW (ref 2.0–4.4)
TSH: 0.32 u[IU]/mL — ABNORMAL LOW (ref 0.450–4.500)

## 2020-08-30 LAB — CBC WITH DIFFERENTIAL/PLATELET
Basophils Absolute: 0 10*3/uL (ref 0.0–0.2)
Basos: 0 %
EOS (ABSOLUTE): 0 10*3/uL (ref 0.0–0.4)
Eos: 0 %
Hematocrit: 36.1 % (ref 34.0–46.6)
Hemoglobin: 12.3 g/dL (ref 11.1–15.9)
Immature Grans (Abs): 0 10*3/uL (ref 0.0–0.1)
Immature Granulocytes: 0 %
Lymphocytes Absolute: 3 10*3/uL (ref 0.7–3.1)
Lymphs: 18 %
MCH: 33.3 pg — ABNORMAL HIGH (ref 26.6–33.0)
MCHC: 34.1 g/dL (ref 31.5–35.7)
MCV: 98 fL — ABNORMAL HIGH (ref 79–97)
Monocytes Absolute: 1 10*3/uL — ABNORMAL HIGH (ref 0.1–0.9)
Monocytes: 6 %
Neutrophils Absolute: 12.5 10*3/uL — ABNORMAL HIGH (ref 1.4–7.0)
Neutrophils: 76 %
Platelets: 209 10*3/uL (ref 150–450)
RBC: 3.69 x10E6/uL — ABNORMAL LOW (ref 3.77–5.28)
RDW: 13.1 % (ref 11.7–15.4)
WBC: 16.5 10*3/uL — ABNORMAL HIGH (ref 3.4–10.8)

## 2020-08-30 LAB — PTH, INTACT AND CALCIUM
Calcium: 9.6 mg/dL (ref 8.7–10.2)
PTH: 22 pg/mL (ref 15–65)

## 2020-09-05 NOTE — Progress Notes (Signed)
Sent to [employer]

## 2020-09-09 ENCOUNTER — Other Ambulatory Visit: Payer: Self-pay

## 2020-09-09 ENCOUNTER — Other Ambulatory Visit (HOSPITAL_COMMUNITY)
Admission: RE | Admit: 2020-09-09 | Discharge: 2020-09-09 | Disposition: A | Discharge status: Home / Self Care | Payer: Medicaid Other | Source: Ambulatory Visit | Attending: Adult Health | Admitting: Adult Health

## 2020-09-09 ENCOUNTER — Encounter: Payer: Self-pay | Admitting: Adult Health

## 2020-09-09 ENCOUNTER — Ambulatory Visit (INDEPENDENT_AMBULATORY_CARE_PROVIDER_SITE_OTHER): Payer: Medicaid Other | Admitting: Adult Health

## 2020-09-09 VITALS — BP 113/79 | HR 107 | Ht 60.0 in | Wt 113.0 lb

## 2020-09-09 DIAGNOSIS — Z0001 Encounter for general adult medical examination with abnormal findings: Secondary | ICD-10-CM | POA: Insufficient documentation

## 2020-09-09 DIAGNOSIS — Z0000 Encounter for general adult medical examination without abnormal findings: Secondary | ICD-10-CM

## 2020-09-09 DIAGNOSIS — Z3009 Encounter for other general counseling and advice on contraception: Secondary | ICD-10-CM | POA: Diagnosis present

## 2020-09-09 DIAGNOSIS — F418 Other specified anxiety disorders: Secondary | ICD-10-CM | POA: Diagnosis not present

## 2020-09-09 DIAGNOSIS — Z01419 Encounter for gynecological examination (general) (routine) without abnormal findings: Secondary | ICD-10-CM

## 2020-09-09 DIAGNOSIS — Z1211 Encounter for screening for malignant neoplasm of colon: Secondary | ICD-10-CM

## 2020-09-09 LAB — HEMOCCULT GUIAC POC 1CARD (OFFICE): Fecal Occult Blood, POC: NEGATIVE

## 2020-09-09 NOTE — Progress Notes (Signed)
Patient ID: Kimberly Rocha, female   DOB: May 06, 1975, 45 y.o.   MRN: [id number] History of Present Illness: Kimberly Rocha is a 51 year old black female, single, has boyfriend, [id number], in fotr well woman gyn exam and pap. She has had tubal ligation and boyfriend wants child, she would want surrogate. She has [facility], but has had tubal ligation, and she is aware they will not cover visit, she still wants it. PCP is Dr Allena Katz.   Current Medications, Allergies, Past Medical History, Past Surgical History, Family History and Social History were reviewed in [employer] record.     Review of Systems: Patient denies any headaches, hearing loss, fatigue, blurred vision, shortness of breath, chest pain, abdominal pain, problems with bowel movements, urination, or intercourse. No joint pain or mood swings. +anxiety and depression     Physical Exam:BP 113/79 (BP Location: Left Arm, Patient Position: Sitting, Cuff Size: Normal)   Pulse (!) 107   Ht 5' (1.524 m)   Wt 113 lb (51.3 kg)   LMP 08/31/2020   BMI 22.07 kg/m   General:  Well developed, well nourished, no acute distress Skin:  Warm and dry Neck:  Midline trachea, normal thyroid, good ROM, no lymphadenopathy Lungs; Clear to auscultation bilaterally Breast:  No dominant palpable mass, retraction, or nipple discharge Cardiovascular: Regular rate and rhythm Abdomen:  Soft, non tender, no hepatosplenomegaly Pelvic:  External genitalia is normal in appearance, no lesions.  The vagina is normal in appearance. Urethra has no lesions or masses. The cervix is bulbous.  Pap with GC/CHL and HR HPV genotyping performed. Uterus is felt to be normal size, shape, and contour.  No adnexal masses or tenderness noted.Bladder is non tender, no masses felt. Rectal: Good sphincter tone, no polyps, or hemorrhoids felt.  Hemoccult negative. Extremities/musculoskeletal:  No swelling or varicosities noted, no clubbing or cyanosis Psych:   No mood changes, alert and cooperative,seems happy AA is 4 Fall risk is low Depression screen [facility] 2/9 09/09/2020 08/22/2020 07/18/2020  Decreased Interest 3 3 0  Down, Depressed, Hopeless 3 3 3   PHQ - 2 Score 6 6 3   Altered sleeping 3 3 1   Tired, decreased energy 2 3 3   Change in appetite 1 3 1   Feeling bad or failure about yourself  1 0 0  Trouble concentrating 0 1 0  Moving slowly or fidgety/restless 0 0 1  Suicidal thoughts - 0 0  PHQ-9 Score 13 16 9   Difficult doing work/chores - Not difficult at all Somewhat difficult    GAD 7 : Generalized Anxiety Score 09/09/2020 07/18/2020  Nervous, Anxious, on Edge 3 1  Control/stop worrying 3 3  Worry too much - different things 3 3  Trouble relaxing 3 3  Restless 3 1  Easily annoyed or irritable 3 3  Afraid - awful might happen 3 2  Total GAD 7 Score 21 16  Anxiety Difficulty - Somewhat difficult    She is on meds and talks with [facility] counselor.  [facility] - 09/09/20 1154       Pregnancy Intention Screening   Does the patient want to become pregnant in the next year? No    Does the patient's partner want to become pregnant in the next year? No    Would the patient like to discuss contraceptive options today? No      Contraception Wrap Up   Current Method Female Sterilization    End Method Female Sterilization    Contraception Counseling Provided  No            Examination chaperoned by Malachy Mood LPN   Impression and Plan: 1. Routine general medical examination at a health care facility Pap sent - Cytology - PAP( [place])  2. Anxiety with depression Follow up with [facility] and PCP  3. Encounter for screening fecal occult blood testing  - POCT occult blood stool  4. Encounter for gynecological examination with Papanicolaou smear of cervix Pap sent Physical in 1 year with PCP Pap in 3 years if normal Get mammogram  Labs with PCP Number given for [facility]

## 2020-09-10 LAB — CYTOLOGY - PAP
Adequacy: ABSENT
Chlamydia: NEGATIVE
Comment: NEGATIVE
Comment: NEGATIVE
Comment: NORMAL
Diagnosis: NEGATIVE
High risk HPV: NEGATIVE
Neisseria Gonorrhea: NEGATIVE

## 2020-09-11 ENCOUNTER — Telehealth: Payer: Self-pay | Admitting: Dermatology

## 2020-09-11 NOTE — Telephone Encounter (Signed)
Patient is calling for a referral appointment from Bjorn Pippin, NP, but does not want to wait until February 2023 so would like referral sent back to Bjorn Pippin, NP.

## 2020-09-11 NOTE — Telephone Encounter (Signed)
Referral was closed because patient did not call back. This was a external referral so it can not be routed back to referring office.

## 2020-09-12 ENCOUNTER — Other Ambulatory Visit: Payer: Self-pay

## 2020-09-12 ENCOUNTER — Ambulatory Visit (INDEPENDENT_AMBULATORY_CARE_PROVIDER_SITE_OTHER): Payer: Self-pay | Admitting: Licensed Clinical Social Worker

## 2020-09-12 DIAGNOSIS — F418 Other specified anxiety disorders: Secondary | ICD-10-CM

## 2020-09-13 ENCOUNTER — Other Ambulatory Visit: Payer: Self-pay | Admitting: Adult Health

## 2020-09-13 MED ORDER — VALACYCLOVIR HCL 1 G PO TABS: 1000.0000 mg | ORAL_TABLET | Freq: Two times a day (BID) | ORAL | 1 refills | Status: DC

## 2020-09-13 NOTE — Progress Notes (Signed)
Rx: valtrex. 

## 2020-09-16 ENCOUNTER — Other Ambulatory Visit: Payer: Self-pay | Admitting: Adult Health

## 2020-09-16 MED ORDER — CLINDAMYCIN PHOSPHATE 1 % EX GEL: CUTANEOUS | 0 refills | Status: DC

## 2020-09-16 NOTE — Progress Notes (Signed)
Will rx clindamycin gel

## 2020-09-17 ENCOUNTER — Encounter: Payer: Self-pay | Admitting: Internal Medicine

## 2020-09-17 ENCOUNTER — Other Ambulatory Visit: Payer: Self-pay

## 2020-09-17 ENCOUNTER — Ambulatory Visit (INDEPENDENT_AMBULATORY_CARE_PROVIDER_SITE_OTHER): Payer: Self-pay | Admitting: Internal Medicine

## 2020-09-17 VITALS — BP 128/82 | HR 116 | Temp 98.8°F | Resp 18 | Ht 60.0 in | Wt 107.0 lb

## 2020-09-17 DIAGNOSIS — E782 Mixed hyperlipidemia: Secondary | ICD-10-CM

## 2020-09-17 DIAGNOSIS — E0590 Thyrotoxicosis, unspecified without thyrotoxic crisis or storm: Secondary | ICD-10-CM

## 2020-09-17 DIAGNOSIS — F418 Other specified anxiety disorders: Secondary | ICD-10-CM

## 2020-09-17 DIAGNOSIS — Z86711 Personal history of pulmonary embolism: Secondary | ICD-10-CM

## 2020-09-17 DIAGNOSIS — B370 Candidal stomatitis: Secondary | ICD-10-CM

## 2020-09-17 HISTORY — DX: Thyrotoxicosis, unspecified without thyrotoxic crisis or storm: E05.90

## 2020-09-17 MED ORDER — METHIMAZOLE 5 MG PO TABS: 5.0000 mg | ORAL_TABLET | Freq: Three times a day (TID) | ORAL | 2 refills | Status: DC

## 2020-09-17 MED ORDER — NYSTATIN 100000 UNIT/ML MT SUSP: 5.0000 mL | Freq: Four times a day (QID) | OROMUCOSAL | 0 refills | Status: DC

## 2020-09-17 MED ORDER — METHIMAZOLE 5 MG PO TABS: 5.0000 mg | ORAL_TABLET | Freq: Every day | ORAL | 2 refills | Status: DC

## 2020-09-17 MED ORDER — ESCITALOPRAM OXALATE 10 MG PO TABS: 10.0000 mg | ORAL_TABLET | Freq: Every day | ORAL | 2 refills | Status: DC

## 2020-09-17 MED ORDER — MIRTAZAPINE 45 MG PO TABS: 45.0000 mg | ORAL_TABLET | Freq: Every day | ORAL | 3 refills | Status: DC

## 2020-09-17 MED ORDER — HYDROXYZINE PAMOATE 50 MG PO CAPS: 50.0000 mg | ORAL_CAPSULE | Freq: Three times a day (TID) | ORAL | 2 refills | Status: DC | PRN

## 2020-09-17 NOTE — Assessment & Plan Note (Signed)
Uncontrolled with Remeron 45 mg qHS and Vistaril 50 mg TID PRN Had added Lexapro 5 mg QD, increased to 10 mg QD Continue BH therapy

## 2020-09-17 NOTE — Assessment & Plan Note (Signed)
In 11/2019, CT chest reviewed Follow up CT chest negative for PE No provoking concern at that time, but could be acute viral illness leading to hypercoagulable state On Eliquis currently - would hold in 10/2020

## 2020-09-17 NOTE — Patient Instructions (Signed)
Please start taking Lexapro 10 mg for anxiety.  Please start taking Methimazole 5 mg once daily for hyperthyroidism. You are being scheduled to get [place] of thyroid. You are being referred to Dr Fransico Him for management of hyperthyroidism.  Please continue taking other medications as prescribed.

## 2020-09-17 NOTE — Progress Notes (Signed)
Established Patient Office Visit  Subjective:  Patient ID: Kimberly Rocha, female    DOB: 1975/06/06  Age: 45 y.o. MRN: [id number]  CC:  Chief Complaint  Patient presents with   Anxiety    Recent loss of her mother.     HPI Kimberly Rocha 45 year old female with PMH of PE (11/2019), hyperthyroidism, seizure disorder, ?herpes simplex and anxiety with depression who presents for follow up of her chronic medical conditions.  Her TSH was still low. She reports having chronic weight loss, tremors and severe anxiety. She remains tachycardic as well.  She had a normal EEG recently and is told to hold Topiramate now.  She has been feeling anxious despite starting Lexapro. She continues to take Remeron and Vistaril as well. She still c/o insomnia. She has been seeing Psychotherapist and states that she has been benefiting from therapy.  She asks for Nystatin suspension that she takes as needed when she has oral thrush.  Past Medical History:  Diagnosis Date   Borderline high cholesterol    Borderline hypertension    Gallstones     Past Surgical History:  Procedure Laterality Date   CESAREAN SECTION     CHOLECYSTECTOMY N/A 08/22/2012   Procedure: LAPAROSCOPIC CHOLECYSTECTOMY;  Surgeon: Jamesetta So, MD;  Location: [facility];  Service: General;  Laterality: N/A;   HERNIA REPAIR     TUBAL LIGATION      Family History  Problem Relation Age of Onset   COPD Father    COPD Mother    HIV/AIDS Brother    Other Daughter        has issues with cervix    Social History   Socioeconomic History   Marital status: Married    Spouse name: Not on file   Number of children: Not on file   Years of education: Not on file   Highest education level: Not on file  Occupational History   Not on file  Tobacco Use   Smoking status: Former    Packs/day: 0.50    Types: Cigarettes   Smokeless tobacco: Never  Vaping Use   Vaping Use: Some days  Substance and Sexual Activity   Alcohol use:  Yes    Comment: occ   Drug use: Yes    Frequency: 3.0 times per week    Types: Marijuana    Comment: twice a day   Sexual activity: Yes    Birth control/protection: Surgical    Comment: tubal  Other Topics Concern   Not on file  Social History Narrative   Not on file   Social Determinants of Health   Financial Resource Strain: Medium Risk   Difficulty of Paying Living Expenses: Somewhat hard  Food Insecurity: Food Insecurity Present   Worried About Running Out of Food in the Last Year: Sometimes true   Ran Out of Food in the Last Year: Sometimes true  Transportation Needs: No Transportation Needs   Lack of Transportation (Medical): No   Lack of Transportation (Non-Medical): No  Physical Activity: Insufficiently Active   Days of Exercise per Week: 2 days   Minutes of Exercise per Session: 20 min  Stress: Stress Concern Present   Feeling of Stress : Very much  Social Connections: Moderately Isolated   Frequency of Communication with Friends and Family: More than three times a week   Frequency of Social Gatherings with Friends and Family: Twice a week   Attends Religious Services: More than 4 times per year  Active Member of Clubs or Organizations: No   Attends Archivist Meetings: Never   Marital Status: Never married  Human resources officer Violence: Not At Risk   Fear of Current or Ex-Partner: No   Emotionally Abused: No   Physically Abused: No   Sexually Abused: No    Outpatient Medications Prior to Visit  Medication Sig Dispense Refill   apixaban (ELIQUIS) 5 MG TABS tablet Take 5 mg by mouth daily.     clindamycin (CLINDAGEL) 1 % gel Apply to affected area 2 times daily 30 g 0   cyclobenzaprine (FLEXERIL) 10 MG tablet Take 1 tablet (10 mg total) by mouth 3 (three) times daily as needed for muscle spasms. 30 tablet 0   rosuvastatin (CRESTOR) 10 MG tablet Take 1 tablet (10 mg total) by mouth daily. 90 tablet 1   triamcinolone cream (KENALOG) 0.1 % Apply 1  application topically 2 (two) times daily. 30 g 0   valACYclovir (VALTREX) 1000 MG tablet Take 1 tablet (1,000 mg total) by mouth 2 (two) times daily. 20 tablet 1   escitalopram (LEXAPRO) 5 MG tablet Take 1 tablet (5 mg total) by mouth daily. 30 tablet 2   hydrOXYzine (VISTARIL) 50 MG capsule Take 50 mg by mouth 3 (three) times daily as needed.     mirtazapine (REMERON) 45 MG tablet Take 45 mg by mouth at bedtime.     topiramate (TOPAMAX) 25 MG tablet Take 25 mg by mouth 2 (two) times daily. (Patient not taking: Reported on 09/17/2020)     No facility-administered medications prior to visit.    Allergies  Allergen Reactions   Demerol Hives and Itching    ROS Review of Systems  Constitutional:  Negative for chills and fever.  HENT:  Negative for congestion, sinus pressure, sinus pain and sore throat.   Eyes:  Negative for pain and discharge.  Respiratory:  Negative for cough and shortness of breath.   Cardiovascular:  Negative for chest pain and palpitations.  Gastrointestinal:  Negative for abdominal pain, constipation, diarrhea, nausea and vomiting.  Endocrine: Negative for polydipsia and polyuria.  Genitourinary:  Negative for dysuria and hematuria.  Musculoskeletal:  Negative for neck pain and neck stiffness.  Skin:  Positive for rash.  Neurological:  Negative for dizziness and weakness.  Psychiatric/Behavioral:  Positive for dysphoric mood and sleep disturbance. Negative for agitation and behavioral problems. The patient is nervous/anxious.      Objective:    Physical Exam Vitals reviewed.  Constitutional:      General: She is not in acute distress.    Appearance: She is not diaphoretic.  HENT:     Head: Normocephalic and atraumatic.     Nose: Nose normal.     Mouth/Throat:     Mouth: Mucous membranes are moist.  Eyes:     General: No scleral icterus.    Extraocular Movements: Extraocular movements intact.  Cardiovascular:     Rate and Rhythm: Normal rate and regular  rhythm.     Pulses: Normal pulses.     Heart sounds: Normal heart sounds. No murmur heard. Pulmonary:     Breath sounds: Normal breath sounds. No wheezing or rales.  Abdominal:     Palpations: Abdomen is soft.     Tenderness: There is no abdominal tenderness.  Musculoskeletal:     Cervical back: Neck supple. No tenderness.     Right lower leg: No edema.     Left lower leg: No edema.  Skin:    General:  Skin is warm.  Neurological:     General: No focal deficit present.     Mental Status: She is alert and oriented to person, place, and time.  Psychiatric:        Mood and Affect: Mood normal.        Behavior: Behavior normal.    BP 128/82 (BP Location: Right Arm, Patient Position: Sitting, Cuff Size: Normal)   Pulse (!) 116   Temp 98.8 F (37.1 C)   Resp 18   Ht 5' (1.524 m)   Wt 107 lb (48.5 kg)   LMP 08/31/2020   SpO2 97%   BMI 20.90 kg/m  Wt Readings from Last 3 Encounters:  09/17/20 107 lb (48.5 kg)  09/09/20 113 lb (51.3 kg)  08/22/20 107 lb (48.5 kg)     Health Maintenance Due  Topic Date Due   Pneumococcal Vaccine 25-38 Years old (1 - PCV) Never done   MAMMOGRAM  Never done   TETANUS/TDAP  Never done   COLONOSCOPY (Pts 45-60yr Insurance coverage will need to be confirmed)  Never done   COVID-19 Vaccine (3 - Booster for Moderna series) 08/27/2020   INFLUENZA VACCINE  08/26/2020    There are no preventive care reminders to display for this patient.  Lab Results  Component Value Date   TSH 0.320 (L) 08/29/2020   Lab Results  Component Value Date   WBC 16.5 (H) 08/29/2020   HGB 12.3 08/29/2020   HCT 36.1 08/29/2020   MCV 98 (H) 08/29/2020   PLT 209 08/29/2020   Lab Results  Component Value Date   NA 143 07/25/2020   K 4.5 07/25/2020   CO2 23 07/25/2020   GLUCOSE 109 (H) 07/25/2020   BUN 7 07/25/2020   CREATININE 0.83 07/25/2020   BILITOT 0.3 07/25/2020   ALKPHOS 79 07/25/2020   AST 11 07/25/2020   ALT 8 07/25/2020   PROT 7.1 07/25/2020    ALBUMIN 4.5 07/25/2020   CALCIUM 9.6 08/29/2020   ANIONGAP 8 04/21/2020   EGFR 89 07/25/2020   Lab Results  Component Value Date   CHOL 233 (H) 07/25/2020   Lab Results  Component Value Date   HDL 47 07/25/2020   Lab Results  Component Value Date   LDLCALC 175 (H) 07/25/2020   Lab Results  Component Value Date   TRIG 63 07/25/2020   Lab Results  Component Value Date   CHOLHDL 5.0 (H) 07/25/2020   No results found for: HGBA1C    Assessment & Plan:   Problem List Items Addressed This Visit       Endocrine   Hyperthyroidism - Primary    Lab Results  Component Value Date   TSH 0.320 (L) 08/29/2020  Started Methimazole 5 mg QD Check [place]athyroid Referred to Endocrinology      Relevant Medications   methimazole (TAPAZOLE) 5 MG tablet   Other Relevant Orders   [place]   Ambulatory referral to Endocrinology   TSH+T4F+T3Free     Other   History of pulmonary embolus (PE)    In 11/2019, CT chest reviewed Follow up CT chest negative for PE No provoking concern at that time, but could be acute viral illness leading to hypercoagulable state On Eliquis currently - would hold in 10/2020      Anxiety with depression    Uncontrolled with Remeron 45 mg qHS and Vistaril 50 mg TID PRN Had added Lexapro 5 mg QD, increased to 10 mg QD Continue BH therapy  Relevant Medications   hydrOXYzine (VISTARIL) 50 MG capsule   escitalopram (LEXAPRO) 10 MG tablet   mirtazapine (REMERON) 45 MG tablet   Mixed hyperlipidemia    Lipid profile reviewed Started Crestor      Other Visit Diagnoses     Oral thrush       Relevant Medications   nystatin (MYCOSTATIN) 100000 UNIT/ML suspension       Meds ordered this encounter  Medications   DISCONTD: methimazole (TAPAZOLE) 5 MG tablet    Sig: Take 1 tablet (5 mg total) by mouth 3 (three) times daily.    Dispense:  30 tablet    Refill:  2   methimazole (TAPAZOLE) 5 MG tablet    Sig: Take 1 tablet (5 mg total) by mouth  daily.    Dispense:  30 tablet    Refill:  2    Please cancel previous prescription with TID dosing.   hydrOXYzine (VISTARIL) 50 MG capsule    Sig: Take 1 capsule (50 mg total) by mouth 3 (three) times daily as needed.    Dispense:  90 capsule    Refill:  2   escitalopram (LEXAPRO) 10 MG tablet    Sig: Take 1 tablet (10 mg total) by mouth daily.    Dispense:  30 tablet    Refill:  2   mirtazapine (REMERON) 45 MG tablet    Sig: Take 1 tablet (45 mg total) by mouth at bedtime.    Dispense:  30 tablet    Refill:  3   nystatin (MYCOSTATIN) 100000 UNIT/ML suspension    Sig: Take 5 mLs (500,000 Units total) by mouth 4 (four) times daily.    Dispense:  60 mL    Refill:  0    Follow-up: Return in about 4 months (around 01/17/2021) for Hyperthyroidism and anxiety.    Lindell Spar, MD

## 2020-09-17 NOTE — Assessment & Plan Note (Signed)
Lipid profile reviewed Started Crestor 

## 2020-09-17 NOTE — Assessment & Plan Note (Signed)
Lab Results  Component Value Date   TSH 0.320 (L) 08/29/2020   Started Methimazole 5 mg QD Check US thyroid Referred to Endocrinology

## 2020-09-18 ENCOUNTER — Telehealth: Payer: Self-pay | Admitting: Licensed Clinical Social Worker

## 2020-09-18 ENCOUNTER — Telehealth (INDEPENDENT_AMBULATORY_CARE_PROVIDER_SITE_OTHER): Payer: Self-pay | Admitting: Licensed Clinical Social Worker

## 2020-09-18 DIAGNOSIS — F418 Other specified anxiety disorders: Secondary | ICD-10-CM

## 2020-09-18 NOTE — BH Specialist Note (Addendum)
[place] Initial Clinical Assessment  MRN: [id number] NAME: Kimberly Rocha Date: 08/29/20  Start time:  130p End time:  220p Total time:  50 min Call number:  [phone]  Type of Contact:  phone Patient consent obtained:  yes Reason for Visit today:  initiate VBH service  Treatment History Patient recently received Inpatient Treatment:  n/a  Facility/Program:    Date of discharge:   Patient currently being seen by therapist/psychiatrist:   Patient currently receiving the following services:    Past Psychiatric History/Hospitalization(s): Anxiety: No Bipolar Disorder: No Depression: No Mania: No Psychosis: No Schizophrenia: No Personality Disorder: No Hospitalization for psychiatric illness: No History of Electroconvulsive Shock Therapy: No Prior Suicide Attempts: No  Clinical Assessment:  PHQ-9 Assessments: Depression screen [facility] 2/9 09/17/2020 09/09/2020 08/22/2020  Decreased Interest '3 3 3  ' Down, Depressed, Hopeless '3 3 3  ' PHQ - 2 Score '6 6 6  ' Altered sleeping '3 3 3  ' Tired, decreased energy '3 2 3  ' Change in appetite '2 1 3  ' Feeling bad or failure about yourself  0 1 0  Trouble concentrating 3 0 1  Moving slowly or fidgety/restless 0 0 0  Suicidal thoughts 0 - 0  PHQ-9 Score '17 13 16  ' Difficult doing work/chores Very difficult - Not difficult at all    GAD-7 Assessments: GAD 7 : Generalized Anxiety Score 09/09/2020 07/18/2020  Nervous, Anxious, on Edge 3 1  Control/stop worrying 3 3  Worry too much - different things 3 3  Trouble relaxing 3 3  Restless 3 1  Easily annoyed or irritable 3 3  Afraid - awful might happen 3 2  Total GAD 7 Score 21 16  Anxiety Difficulty - Somewhat difficult     Social Functioning Social maturity:  WNL Social judgement:  fair  Stress Current stressors:  anger, bereavement Familial stressors:  bereavement Sleep:  fair Appetite:  fair Coping ability:  exhausted Patient taking medications as prescribed:   yes  Current medications:  Outpatient Encounter Medications as of 09/18/2020  Medication Sig   apixaban (ELIQUIS) 5 MG TABS tablet Take 5 mg by mouth daily.   clindamycin (CLINDAGEL) 1 % gel Apply to affected area 2 times daily   cyclobenzaprine (FLEXERIL) 10 MG tablet Take 1 tablet (10 mg total) by mouth 3 (three) times daily as needed for muscle spasms.   escitalopram (LEXAPRO) 10 MG tablet Take 1 tablet (10 mg total) by mouth daily.   hydrOXYzine (VISTARIL) 50 MG capsule Take 1 capsule (50 mg total) by mouth 3 (three) times daily as needed.   methimazole (TAPAZOLE) 5 MG tablet Take 1 tablet (5 mg total) by mouth daily.   mirtazapine (REMERON) 45 MG tablet Take 1 tablet (45 mg total) by mouth at bedtime.   nystatin (MYCOSTATIN) 100000 UNIT/ML suspension Take 5 mLs (500,000 Units total) by mouth 4 (four) times daily.   rosuvastatin (CRESTOR) 10 MG tablet Take 1 tablet (10 mg total) by mouth daily.   topiramate (TOPAMAX) 25 MG tablet Take 25 mg by mouth 2 (two) times daily. (Patient not taking: Reported on 09/17/2020)   triamcinolone cream (KENALOG) 0.1 % Apply 1 application topically 2 (two) times daily.   valACYclovir (VALTREX) 1000 MG tablet Take 1 tablet (1,000 mg total) by mouth 2 (two) times daily.   No facility-administered encounter medications on file as of 09/18/2020.    Self-harm Behaviors Risk Assessment Self-harm risk factors:  no Patient endorses recent thoughts of harming self:    [place]  Suicide Severity Rating Scale:  C-SRSS 2020/05/10 09-14-2020  1. Wish to be Dead No No  2. Suicidal Thoughts No No  6. Suicide Behavior Question No No    Danger to Others Risk Assessment Danger to others risk factors:  no Patient endorses recent thoughts of harming others:    Dynamic Appraisal of Situational Aggression (DASA): No flowsheet data found.  Substance Use Assessment Patient recently consumed alcohol:    Alcohol Use Disorder Identification Test (AUDIT):  Alcohol Use Disorder  Test (AUDIT) 09/09/2020  1. How often do you have a drink containing alcohol? 2  2. How many drinks containing alcohol do you have on a typical day when you are drinking? 1  3. How often do you have six or more drinks on one occasion? 1  AUDIT-C Score 4  4. How often during the last year have you found that you were not able to stop drinking once you had started? 0  5. How often during the last year have you failed to do what was normally expected from you because of drinking? 0  6. How often during the last year have you needed a first drink in the morning to get yourself going after a heavy drinking session? 0  7. How often during the last year have you had a feeling of guilt of remorse after drinking? 0  8. How often during the last year have you been unable to remember what happened the night before because you had been drinking? 0  9. Have you or someone else been injured as a result of your drinking? 0  10. Has a relative or friend or a doctor or another health worker been concerned about your drinking or suggested you cut down? 0  Alcohol Use Disorder Identification Test Final Score (AUDIT) 4   Patient recently used drugs:    Opioid Risk Assessment:  Patient is concerned about dependence or abuse of substances:    ASAM Multidimensional Assessment Summary:  Dimension 1:    Dimension 1 Rating:    Dimension 2:    Dimension 2 Rating:    Dimension 3:    Dimension 3 Rating:    Dimension 4:    Dimension 4 Rating:    Dimension 5:    Dimension 5 Rating:    Dimension 6:    Dimension 6 Rating:   ASAM's Severity Rating Score:   ASAM Recommended Level of Treatment:     Goals, Interventions and Follow-up Plan Goals: Increase healthy adjustment to current life circumstances Interventions: Solution-Focused Strategies, Mindfulness or Relaxation Training, and CBT Cognitive Behavioral Therapy Follow-up Plan:  weekly VBH sessions  Summary of Clinical Assessment Summary: Kimberly Rocha is a 45 yr  old woman that was referred by Dr. Posey Pronto for anxiety. She reports that she needs to see a therapist to help her sort out her past.  She states that she is trying to grieve the lost of her father that died a few years ago and her mother who died few months ago.  She is on medication for anxiety but may need medication to help her calm down.  She gets irritable and is on edge most of the time.  She has a history of incarceration.   Therapist met with Patient in an initial therapy session to assess current mood and to build rapport. Therapist engaged Patient in discussion about her life and what is going well for her. Therapist provided support for Patient as she shared details about her life, her current stressors,  mood, coping skills, her past, and her children. Therapist prompted Patient to discuss her support system and ways that she manages her daily stress, anger, and frustrations.     Lubertha South, LCSW

## 2020-09-18 NOTE — BH Specialist Note (Signed)
Virtual Behavioral Health Treatment Plan Team Note  MRN: [id number] NAME: Kimberly Rocha  DATE: 09/18/20  Start time:   335pEnd time:  340p Total time:  5 min  Total number of Virtual BH Treatment Team Plan encounters: 1/4  Treatment Team Attendees: Nolon Rod, LCSW & Dr. Vanetta Shawl Psychiatrist  Diagnoses: No diagnosis found.  Goals, Interventions and Follow-up Plan Goals: Increase healthy adjustment to current life circumstances Interventions: Solution-Focused Strategies Mindfulness or Relaxation Training CBT Cognitive Behavioral Therapy Medication Management Recommendations: no changes at this time Follow-up Plan: weekly VBH sessions  History of the present illness Presenting Problem/Current Symptoms: continued symptoms of DX  Psychiatric History  Depression: No Anxiety: No Mania: No Psychosis: No PTSD symptoms: No  Past Psychiatric History/Hospitalization(s): Hospitalization for psychiatric illness: No Prior Suicide Attempts: No Prior Self-injurious behavior: No  Psychosocial stressors  bereavement Self-harm Behaviors Risk Assessment  N/a Screenings PHQ-9 Assessments:  Depression screen [facility] 2/9 09/17/2020 09/09/2020 08/22/2020  Decreased Interest 3 3 3   Down, Depressed, Hopeless 3 3 3   PHQ - 2 Score 6 6 6   Altered sleeping 3 3 3   Tired, decreased energy 3 2 3   Change in appetite 2 1 3   Feeling bad or failure about yourself  0 1 0  Trouble concentrating 3 0 1  Moving slowly or fidgety/restless 0 0 0  Suicidal thoughts 0 - 0  PHQ-9 Score 17 13 16   Difficult doing work/chores Very difficult - Not difficult at all   GAD-7 Assessments:  GAD 7 : Generalized Anxiety Score 09/09/2020 07/18/2020  Nervous, Anxious, on Edge 3 1  Control/stop worrying 3 3  Worry too much - different things 3 3  Trouble relaxing 3 3  Restless 3 1  Easily annoyed or irritable 3 3  Afraid - awful might happen 3 2  Total GAD 7 Score 21 16  Anxiety Difficulty - Somewhat difficult     Past Medical History Past Medical History:  Diagnosis Date   Borderline high cholesterol    Borderline hypertension    Gallstones     Vital signs: There were no vitals filed for this visit.  Allergies:  Allergies as of 09/18/2020 - Review Complete 09/17/2020  Allergen Reaction Noted   Demerol Hives and Itching 10/13/2010    Medication History Current medications:  Outpatient Encounter Medications as of 09/18/2020  Medication Sig   apixaban (ELIQUIS) 5 MG TABS tablet Take 5 mg by mouth daily.   clindamycin (CLINDAGEL) 1 % gel Apply to affected area 2 times daily   cyclobenzaprine (FLEXERIL) 10 MG tablet Take 1 tablet (10 mg total) by mouth 3 (three) times daily as needed for muscle spasms.   escitalopram (LEXAPRO) 10 MG tablet Take 1 tablet (10 mg total) by mouth daily.   hydrOXYzine (VISTARIL) 50 MG capsule Take 1 capsule (50 mg total) by mouth 3 (three) times daily as needed.   methimazole (TAPAZOLE) 5 MG tablet Take 1 tablet (5 mg total) by mouth daily.   mirtazapine (REMERON) 45 MG tablet Take 1 tablet (45 mg total) by mouth at bedtime.   nystatin (MYCOSTATIN) 100000 UNIT/ML suspension Take 5 mLs (500,000 Units total) by mouth 4 (four) times daily.   rosuvastatin (CRESTOR) 10 MG tablet Take 1 tablet (10 mg total) by mouth daily.   topiramate (TOPAMAX) 25 MG tablet Take 25 mg by mouth 2 (two) times daily. (Patient not taking: Reported on 09/17/2020)   triamcinolone cream (KENALOG) 0.1 % Apply 1 application topically 2 (two) times daily.   valACYclovir (  VALTREX) 1000 MG tablet Take 1 tablet (1,000 mg total) by mouth 2 (two) times daily.   No facility-administered encounter medications on file as of 09/18/2020.     Scribe for Treatment Team: Marinda Elk, LCSW

## 2020-09-18 NOTE — Progress Notes (Addendum)
[facility] ([facility]) Psychiatric Consultant Case Review   Kimberly Rocha is a 45 y.o. year old female with a history of depression, anxiety, hyperlipidemia, PE on Eliquis, hyperthyroidism, who is referred for anxiety, irritability. Psychosocial stressors including childhood trauma, loss of both of her parents, including her mother in 2022.  Lexapro was started by PCP.   Assessment/Provisional Diagnosis # unspecified anxiety disorder # r/o PTSD She will benefit from traditional therapy given her trauma history. Will plan to make a referral if she is interested.   Recommendation  - continue mirtazapine 45 mg at night  - continue lexapro 10 mg daily (recently started) - consider referral for psychotherapy  Thank you for your consult. We will continue to follow the patient. Please contact vBHI  for any questions or concerns.   The above treatment considerations and suggestions are based on consultation with the [facility] specialist and/or PCP and a review of information available in the shared registry and the patient's Electronic Health Record (EHR). I have not personally examined the patient. All recommendations should be implemented with consideration of the patient's relevant prior history and current clinical status. Please feel free to call me with any questions about the care of this patient.

## 2020-09-19 ENCOUNTER — Other Ambulatory Visit: Payer: Self-pay

## 2020-09-19 ENCOUNTER — Ambulatory Visit (INDEPENDENT_AMBULATORY_CARE_PROVIDER_SITE_OTHER): Payer: Self-pay | Admitting: Licensed Clinical Social Worker

## 2020-09-19 DIAGNOSIS — F418 Other specified anxiety disorders: Secondary | ICD-10-CM

## 2020-09-19 NOTE — Progress Notes (Signed)
No Show! Unable to leave message; vmail full

## 2020-09-24 ENCOUNTER — Ambulatory Visit (HOSPITAL_COMMUNITY): Payer: Self-pay | Attending: Internal Medicine

## 2020-10-07 ENCOUNTER — Ambulatory Visit (HOSPITAL_COMMUNITY)
Admission: RE | Admit: 2020-10-07 | Discharge: 2020-10-07 | Disposition: A | Discharge status: Home / Self Care | Payer: Self-pay | Source: Ambulatory Visit | Attending: Internal Medicine | Admitting: Internal Medicine

## 2020-10-07 ENCOUNTER — Other Ambulatory Visit: Payer: Self-pay

## 2020-10-07 DIAGNOSIS — E0590 Thyrotoxicosis, unspecified without thyrotoxic crisis or storm: Secondary | ICD-10-CM | POA: Insufficient documentation

## 2020-10-07 IMAGING — US US THYROID
1 series · 13 of 25 positions shown · non-contrast
Comparison: CTA chest, [DATE].

CLINICAL DATA: Hyperthyroid.

EXAM:
THYROID ULTRASOUND
TECHNIQUE: Ultrasound examination of the thyroid gland and adjacent soft
tissues was performed.

[Series 1: us thyroid · 13 of 55 slices shown]
[im 1/55]
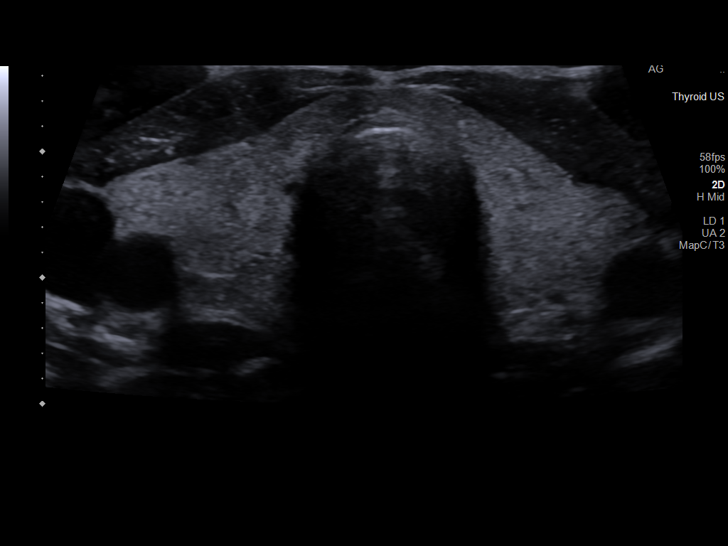
[im 5/55]
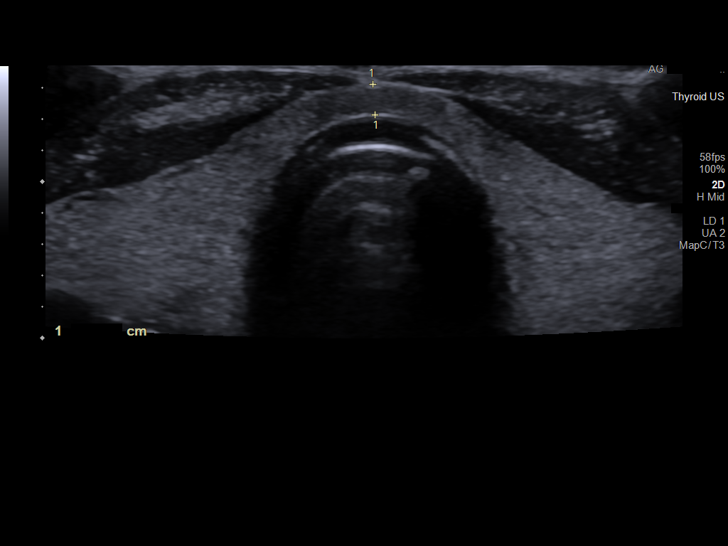
[im 10/55]
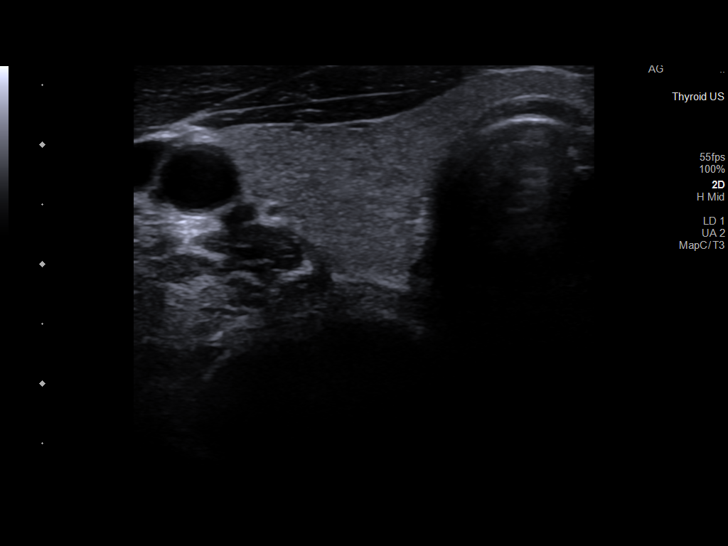
[im 14/55]
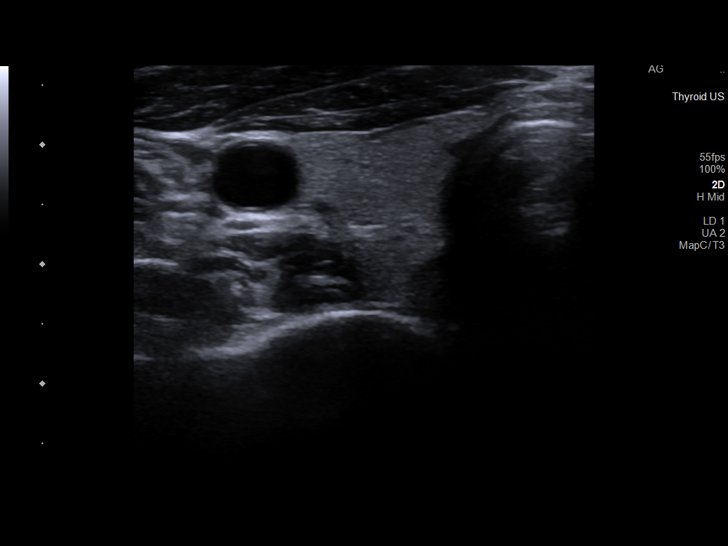
[im 19/55]
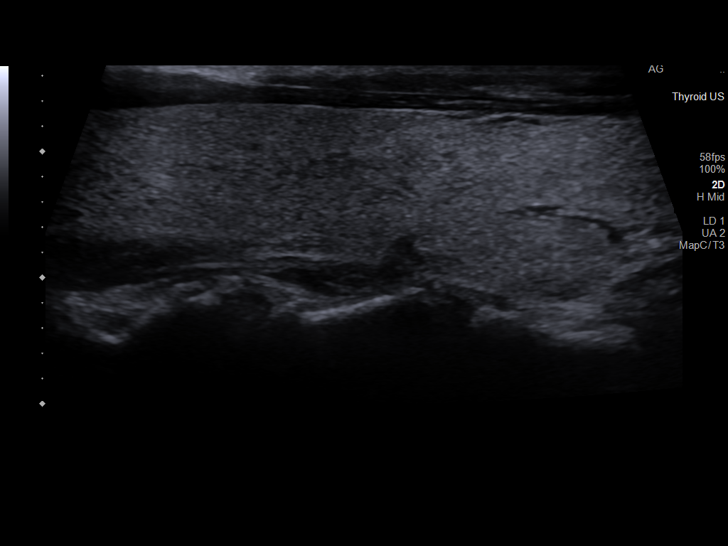
[im 23/55]
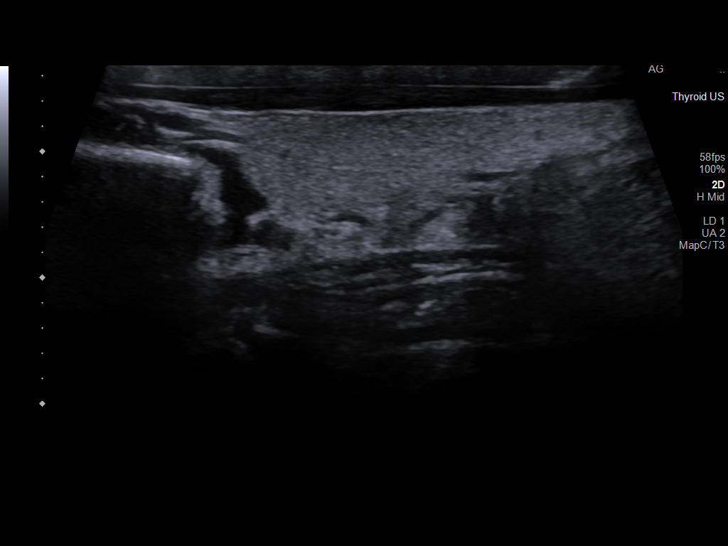
[im 28/55]
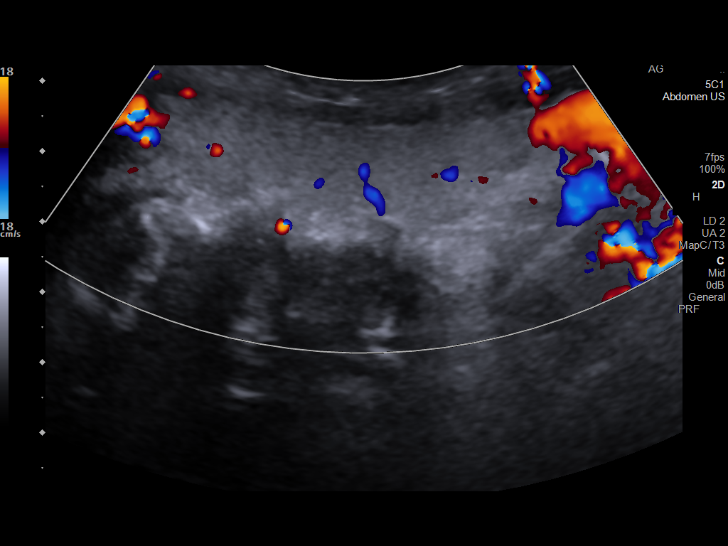
[im 32/55]
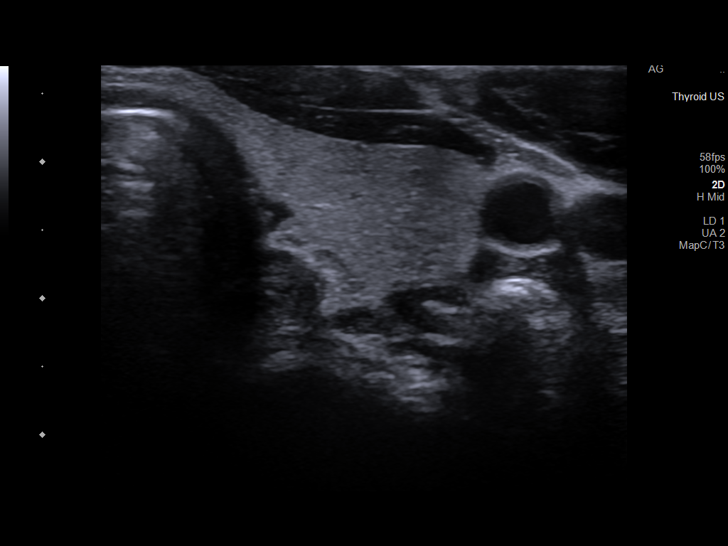
[im 37/55]
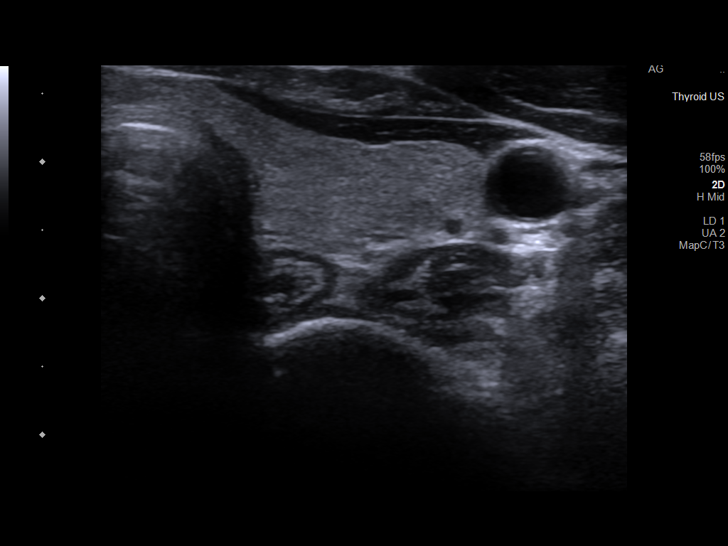
[im 41/55]
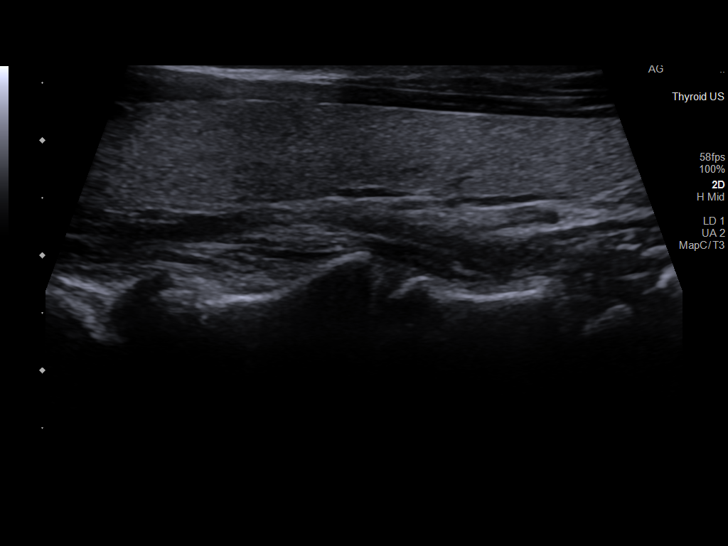
[im 46/55]
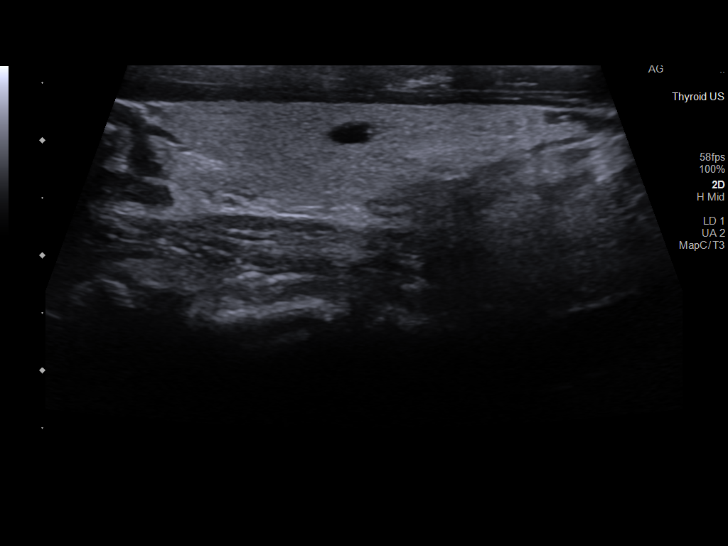
[im 50/55]
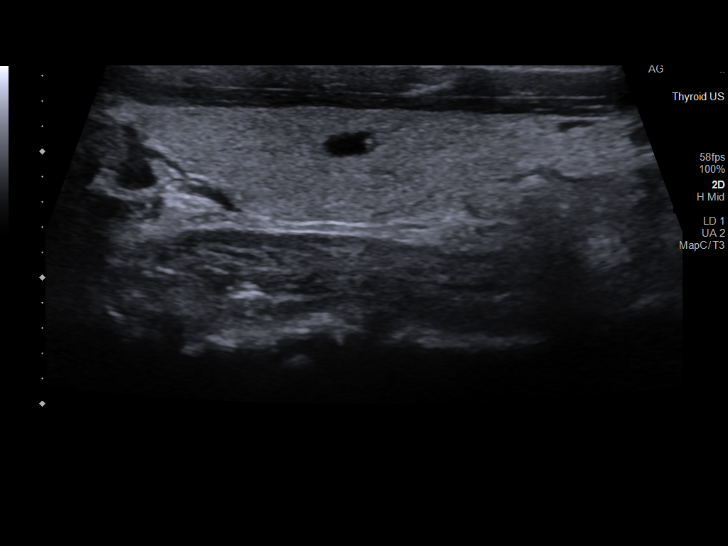
[im 55/55]
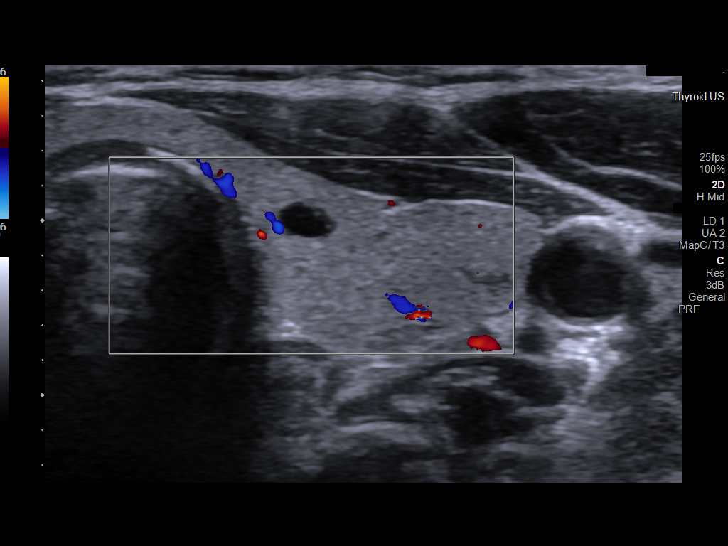

[13 of 25 positions shown; findings below may reference images not displayed]

FINDINGS: Parenchymal Echotexture: Normal

Isthmus: 0.2 cm

Right lobe: 5.8 x 1.6 x 1.9 cm

Left lobe: 4.9 x 1.1 x 1.7 cm

_________________________________________________________

Estimated total number of nodules >/= 1 cm: 0

Number of spongiform nodules >/=  2 cm not described below (TR1): 0

Number of mixed cystic and solid nodules >/= 1.5 cm not described
below (TR2): 0

_________________________________________________________

Nodule # 1:

Location: Left; Inferior

Maximum size: 0.5 cm; Other 2 dimensions: 0.4 x 0.2 cm

Composition: cystic/almost completely cystic (0)

Echogenicity: anechoic (0)

Shape: not taller-than-wide (0)

Margins: smooth (0)

Echogenic foci: macrocalcifications (1)

ACR TI-RADS total points: 1.

ACR TI-RADS risk category: TR1 (0-1 points).

ACR TI-RADS recommendations:

This nodule does NOT meet TI-RADS criteria for biopsy or dedicated
follow-up.

_________________________________________________________
IMPRESSION: 1. Normal, normal enlarged appearance of thyroid.
2. Cystic, subcentimeter LEFT thyroid nodule without additional
follow-up or investigation recommended per current guidelines.

The above is in keeping with the ACR TI-RADS recommendations - [HOSPITAL] [M8];[DATE].

## 2020-10-08 ENCOUNTER — Encounter: Payer: Self-pay | Admitting: Internal Medicine

## 2020-10-09 ENCOUNTER — Encounter: Payer: Self-pay | Admitting: Internal Medicine

## 2020-10-09 ENCOUNTER — Ambulatory Visit (INDEPENDENT_AMBULATORY_CARE_PROVIDER_SITE_OTHER): Payer: Self-pay | Admitting: Internal Medicine

## 2020-10-09 ENCOUNTER — Other Ambulatory Visit: Payer: Self-pay

## 2020-10-09 VITALS — BP 125/81 | HR 97 | Temp 98.3°F | Resp 18 | Ht 60.0 in | Wt 112.0 lb

## 2020-10-09 DIAGNOSIS — G5601 Carpal tunnel syndrome, right upper limb: Secondary | ICD-10-CM | POA: Insufficient documentation

## 2020-10-09 DIAGNOSIS — L509 Urticaria, unspecified: Secondary | ICD-10-CM

## 2020-10-09 DIAGNOSIS — E0590 Thyrotoxicosis, unspecified without thyrotoxic crisis or storm: Secondary | ICD-10-CM

## 2020-10-09 MED ORDER — CETIRIZINE HCL 10 MG PO TABS: 10.0000 mg | ORAL_TABLET | Freq: Every day | ORAL | 0 refills | Status: DC

## 2020-10-09 NOTE — Progress Notes (Signed)
Acute Office Visit  Subjective:    Patient ID: Kimberly Rocha, female    DOB: 06/12/1975, 45 y.o.   MRN: [id number]  Chief Complaint  Patient presents with   Rash    Pt has rash on left arm started 10-07-20 started itching on elbow then arm started tingling then saw the bumps itches like crazy    HPI Patient is in today for c/o rash over her left elbow and forearm for the last 3 days.  She has noticed bumps, which have been itching.  She tried taking Benadryl and applied calamine lotion as well as hydrocortisone cream over it, which helped with the itching.  She denies any new chemical exposure.  Denies any new soap, detergent, or any new clothing.  Denies any recent outdoor activities or insect bite.  She complains of heat intolerance and sweating at times.  Of note, she has history of hyperthyroidism and takes methimazole now.  She is going to see an endocrinologist for it.  She also complains of right hand tingling and numbness at times.  Denies any recent injury.  Past Medical History:  Diagnosis Date   Borderline high cholesterol    Borderline hypertension    Gallstones     Past Surgical History:  Procedure Laterality Date   CESAREAN SECTION     CHOLECYSTECTOMY N/A 08/22/2012   Procedure: LAPAROSCOPIC CHOLECYSTECTOMY;  Surgeon: Jamesetta So, MD;  Location: [facility];  Service: General;  Laterality: N/A;   HERNIA REPAIR     TUBAL LIGATION      Family History  Problem Relation Age of Onset   COPD Father    COPD Mother    HIV/AIDS Brother    Other Daughter        has issues with cervix    Social History   Socioeconomic History   Marital status: Married    Spouse name: Not on file   Number of children: Not on file   Years of education: Not on file   Highest education level: Not on file  Occupational History   Not on file  Tobacco Use   Smoking status: Former    Packs/day: 0.50    Types: Cigarettes   Smokeless tobacco: Never  Vaping Use   Vaping Use: Some  days  Substance and Sexual Activity   Alcohol use: Yes    Comment: occ   Drug use: Yes    Frequency: 3.0 times per week    Types: Marijuana    Comment: twice a day   Sexual activity: Yes    Birth control/protection: Surgical    Comment: tubal  Other Topics Concern   Not on file  Social History Narrative   Not on file   Social Determinants of Health   Financial Resource Strain: Medium Risk   Difficulty of Paying Living Expenses: Somewhat hard  Food Insecurity: Food Insecurity Present   Worried About Running Out of Food in the Last Year: Sometimes true   Ran Out of Food in the Last Year: Sometimes true  Transportation Needs: No Transportation Needs   Lack of Transportation (Medical): No   Lack of Transportation (Non-Medical): No  Physical Activity: Insufficiently Active   Days of Exercise per Week: 2 days   Minutes of Exercise per Session: 20 min  Stress: Stress Concern Present   Feeling of Stress : Very much  Social Connections: Moderately Isolated   Frequency of Communication with Friends and Family: More than three times a week   Frequency  of Social Gatherings with Friends and Family: Twice a week   Attends Religious Services: More than 4 times per year   Active Member of [employer] or Organizations: No   Attends Music therapist: Never   Marital Status: Never married  Human resources officer Violence: Not At Risk   Fear of Current or Ex-Partner: No   Emotionally Abused: No   Physically Abused: No   Sexually Abused: No    Outpatient Medications Prior to Visit  Medication Sig Dispense Refill   apixaban (ELIQUIS) 5 MG TABS tablet Take 5 mg by mouth daily.     clindamycin (CLINDAGEL) 1 % gel Apply to affected area 2 times daily 30 g 0   cyclobenzaprine (FLEXERIL) 10 MG tablet Take 1 tablet (10 mg total) by mouth 3 (three) times daily as needed for muscle spasms. 30 tablet 0   escitalopram (LEXAPRO) 10 MG tablet Take 1 tablet (10 mg total) by mouth daily. 30 tablet 2    hydrOXYzine (VISTARIL) 50 MG capsule Take 1 capsule (50 mg total) by mouth 3 (three) times daily as needed. 90 capsule 2   methimazole (TAPAZOLE) 5 MG tablet Take 1 tablet (5 mg total) by mouth daily. 30 tablet 2   mirtazapine (REMERON) 45 MG tablet Take 1 tablet (45 mg total) by mouth at bedtime. 30 tablet 3   nystatin (MYCOSTATIN) 100000 UNIT/ML suspension Take 5 mLs (500,000 Units total) by mouth 4 (four) times daily. 60 mL 0   rosuvastatin (CRESTOR) 10 MG tablet Take 1 tablet (10 mg total) by mouth daily. 90 tablet 1   topiramate (TOPAMAX) 25 MG tablet Take 25 mg by mouth 2 (two) times daily.     triamcinolone cream (KENALOG) 0.1 % Apply 1 application topically 2 (two) times daily. 30 g 0   valACYclovir (VALTREX) 1000 MG tablet Take 1 tablet (1,000 mg total) by mouth 2 (two) times daily. 20 tablet 1   No facility-administered medications prior to visit.    Allergies  Allergen Reactions   Demerol Hives and Itching    Review of Systems  Constitutional:  Negative for chills and fever.  Respiratory:  Negative for cough and shortness of breath.   Cardiovascular:  Negative for chest pain and palpitations.  Endocrine: Positive for heat intolerance. Negative for cold intolerance.  Genitourinary:  Negative for dysuria and hematuria.  Skin:  Positive for rash.      Objective:    Physical Exam Vitals reviewed.  Constitutional:      General: She is not in acute distress.    Appearance: She is not diaphoretic.  HENT:     Head: Normocephalic and atraumatic.     Nose: Nose normal.     Mouth/Throat:     Mouth: Mucous membranes are moist.  Eyes:     General: No scleral icterus.    Extraocular Movements: Extraocular movements intact.  Cardiovascular:     Rate and Rhythm: Normal rate and regular rhythm.     Pulses: Normal pulses.     Heart sounds: Normal heart sounds. No murmur heard. Pulmonary:     Breath sounds: Normal breath sounds. No wheezing or rales.  Musculoskeletal:      Cervical back: Neck supple. No tenderness.     Right lower leg: No edema.     Left lower leg: No edema.  Skin:    General: Skin is warm.     Findings: Rash (Multiple papules on erythematous base over left forearm -dorsal aspect) present.  Neurological:  General: No focal deficit present.     Mental Status: She is alert and oriented to person, place, and time.  Psychiatric:        Mood and Affect: Mood normal.        Behavior: Behavior normal.    BP 125/81 (BP Location: Right Arm, Patient Position: Sitting, Cuff Size: Normal)   Pulse 97   Temp 98.3 F (36.8 C) (Oral)   Resp 18   Ht 5' (1.524 m)   Wt 112 lb 0.6 oz (50.8 kg)   SpO2 97%   BMI 21.88 kg/m  Wt Readings from Last 3 Encounters:  10/09/20 112 lb 0.6 oz (50.8 kg)  09/17/20 107 lb (48.5 kg)  09/09/20 113 lb (51.3 kg)    Health Maintenance Due  Topic Date Due   Pneumococcal Vaccine 15-11 Years old (1 - PCV) Never done   MAMMOGRAM  Never done   TETANUS/TDAP  Never done   COLONOSCOPY (Pts 45-65yr Insurance coverage will need to be confirmed)  Never done   COVID-19 Vaccine (3 - Booster for Moderna series) 08/27/2020    There are no preventive care reminders to display for this patient.   Lab Results  Component Value Date   TSH 0.320 (L) 08/29/2020   Lab Results  Component Value Date   WBC 16.5 (H) 08/29/2020   HGB 12.3 08/29/2020   HCT 36.1 08/29/2020   MCV 98 (H) 08/29/2020   PLT 209 08/29/2020   Lab Results  Component Value Date   NA 143 07/25/2020   K 4.5 07/25/2020   CO2 23 07/25/2020   GLUCOSE 109 (H) 07/25/2020   BUN 7 07/25/2020   CREATININE 0.83 07/25/2020   BILITOT 0.3 07/25/2020   ALKPHOS 79 07/25/2020   AST 11 07/25/2020   ALT 8 07/25/2020   PROT 7.1 07/25/2020   ALBUMIN 4.5 07/25/2020   CALCIUM 9.6 08/29/2020   ANIONGAP 8 04/21/2020   EGFR 89 07/25/2020   Lab Results  Component Value Date   CHOL 233 (H) 07/25/2020   Lab Results  Component Value Date   HDL 47 07/25/2020    Lab Results  Component Value Date   LDLCALC 175 (H) 07/25/2020   Lab Results  Component Value Date   TRIG 63 07/25/2020   Lab Results  Component Value Date   CHOLHDL 5.0 (H) 07/25/2020   No results found for: HGBA1C     Assessment & Plan:   Problem List Items Addressed This Visit       Urticaria    -  Primary Rash likely due to allergic reaction Zyrtec 10 mg daily as needed Advised to use hydrocortisone cream as needed for localized itching If persistent, will start steroid taper  Relevant Medications  cetirizine (ZYRTEC) 10 MG tablet    Endocrine   Hyperthyroidism    Lab Results  Component Value Date   TSH 0.320 (L) 08/29/2020  On Methimazole 5 mg QD Checked UKoreathyroid Referred to Endocrinology        Nervous and Auditory   Carpal tunnel syndrome of right wrist    Advised to wear wrist brace at nighttime If persistent, will refer to Hand surgeon         Meds ordered this encounter  Medications   cetirizine (ZYRTEC) 10 MG tablet    Sig: Take 1 tablet (10 mg total) by mouth daily.    Dispense:  30 tablet    Refill:  0     Spence Soberano KKeith Rake MD

## 2020-10-09 NOTE — Assessment & Plan Note (Signed)
Lab Results  Component Value Date   TSH 0.320 (L) 08/29/2020   On Methimazole 5 mg QD Checked US thyroid Referred to Endocrinology

## 2020-10-09 NOTE — Patient Instructions (Signed)
Please take Zyrtec for allergic reaction.  Okay to apply lotion and Hydrocortisone cream for itching.

## 2020-10-09 NOTE — Assessment & Plan Note (Signed)
Advised to wear wrist brace at nighttime If persistent, will refer to Hand surgeon

## 2020-10-13 ENCOUNTER — Encounter: Payer: Self-pay | Admitting: Internal Medicine

## 2020-10-14 ENCOUNTER — Other Ambulatory Visit: Payer: Self-pay | Admitting: Internal Medicine

## 2020-10-14 DIAGNOSIS — L509 Urticaria, unspecified: Secondary | ICD-10-CM

## 2020-10-14 DIAGNOSIS — E782 Mixed hyperlipidemia: Secondary | ICD-10-CM

## 2020-10-14 MED ORDER — METHYLPREDNISOLONE 4 MG PO TBPK: ORAL_TABLET | ORAL | 0 refills | Status: DC

## 2020-10-17 ENCOUNTER — Ambulatory Visit: Payer: Self-pay

## 2020-10-21 ENCOUNTER — Encounter: Payer: Self-pay | Admitting: Nurse Practitioner

## 2020-10-21 ENCOUNTER — Ambulatory Visit (INDEPENDENT_AMBULATORY_CARE_PROVIDER_SITE_OTHER): Payer: Self-pay | Admitting: Nurse Practitioner

## 2020-10-21 ENCOUNTER — Ambulatory Visit (HOSPITAL_COMMUNITY)
Admission: RE | Admit: 2020-10-21 | Discharge: 2020-10-21 | Disposition: A | Discharge status: Home / Self Care | Payer: Self-pay | Source: Ambulatory Visit | Attending: Internal Medicine | Admitting: Internal Medicine

## 2020-10-21 ENCOUNTER — Other Ambulatory Visit: Payer: Self-pay

## 2020-10-21 VITALS — BP 116/76 | HR 98 | Ht 60.0 in | Wt 113.8 lb

## 2020-10-21 DIAGNOSIS — E0590 Thyrotoxicosis, unspecified without thyrotoxic crisis or storm: Secondary | ICD-10-CM

## 2020-10-21 DIAGNOSIS — Z1231 Encounter for screening mammogram for malignant neoplasm of breast: Secondary | ICD-10-CM | POA: Insufficient documentation

## 2020-10-21 IMAGING — MG MM DIGITAL SCREENING BILAT W/ TOMO AND CAD
8 series · 9 of 24 positions shown · non-contrast
Comparison: None.

CLINICAL DATA: Screening.

EXAM:
DIGITAL SCREENING BILATERAL MAMMOGRAM WITH TOMOSYNTHESIS AND CAD
TECHNIQUE: Bilateral screening digital craniocaudal and mediolateral oblique
mammograms were obtained. Bilateral screening digital breast
tomosynthesis was performed. The images were evaluated with
computer-aided detection.

[L CC synth-2D]
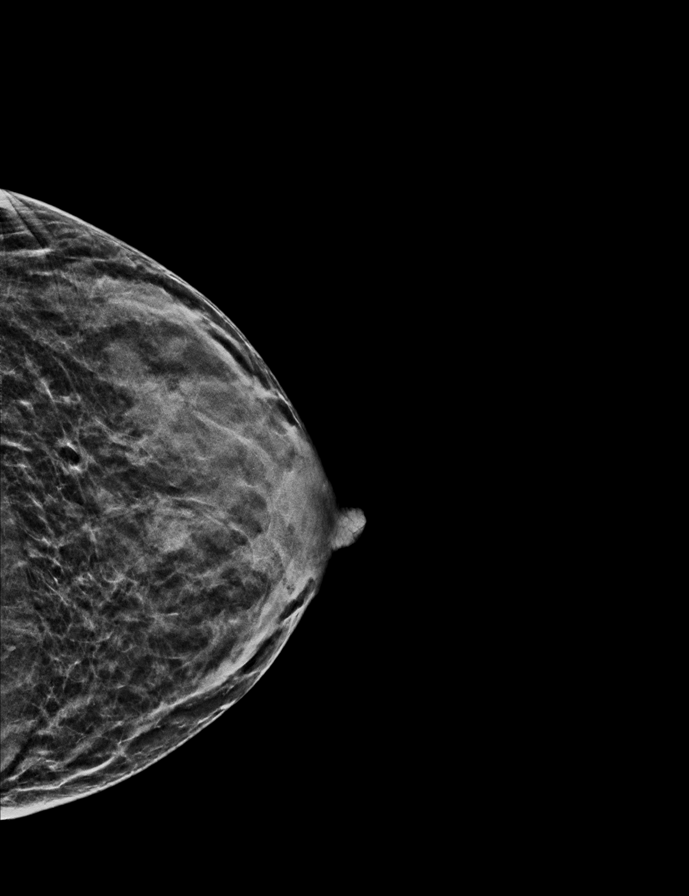

[L MLO synth-2D]
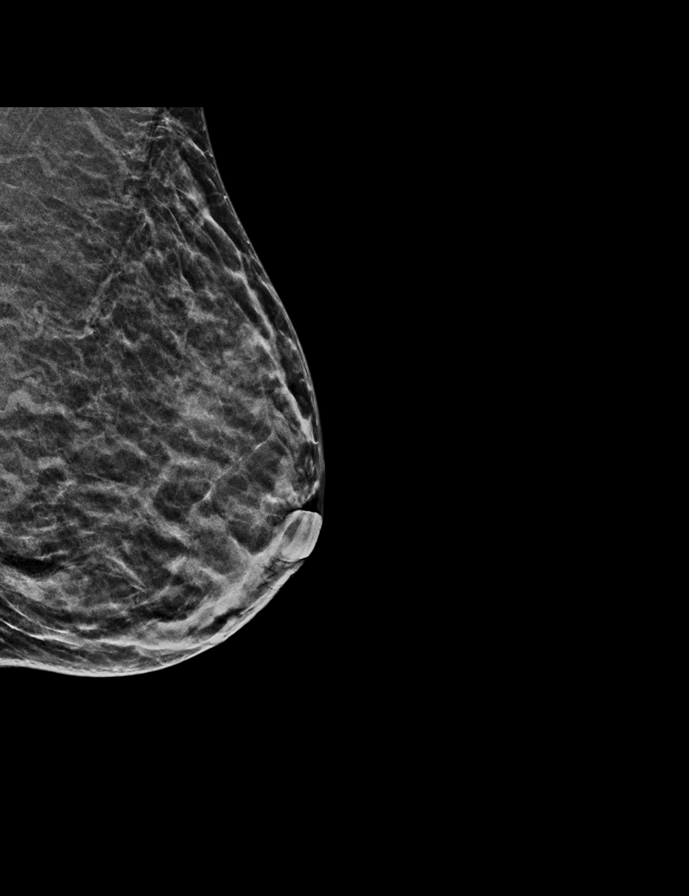

[R CC synth-2D]
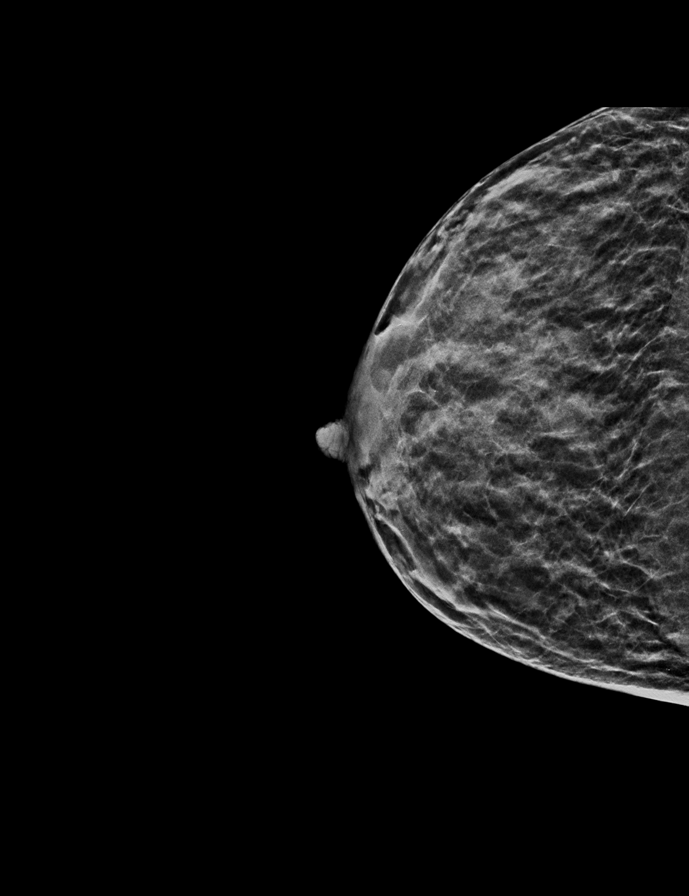

[R MLO synth-2D]
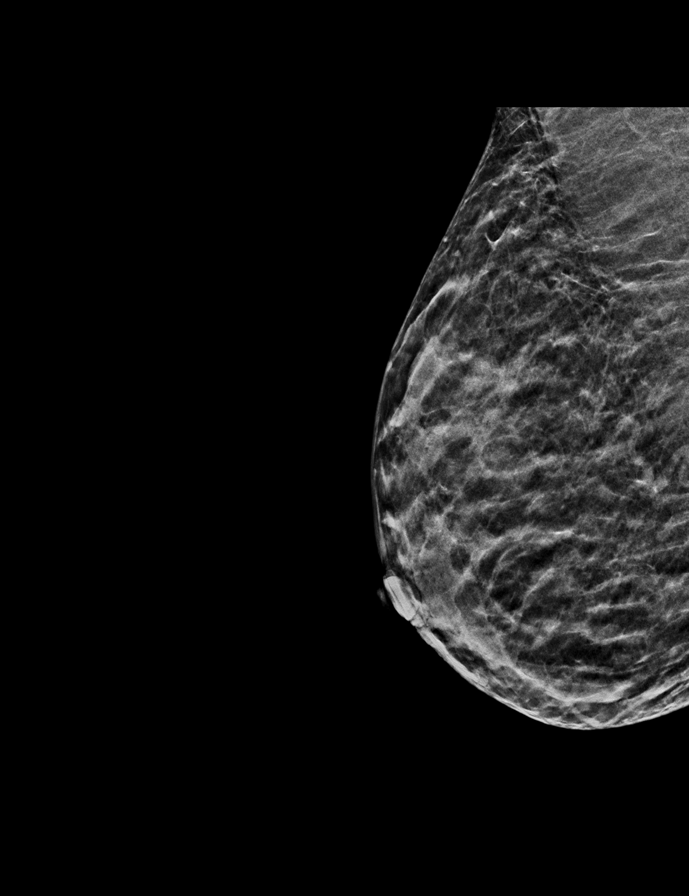

[R MLO tomo · 2 of 32 frames shown]
[frame 11/32]
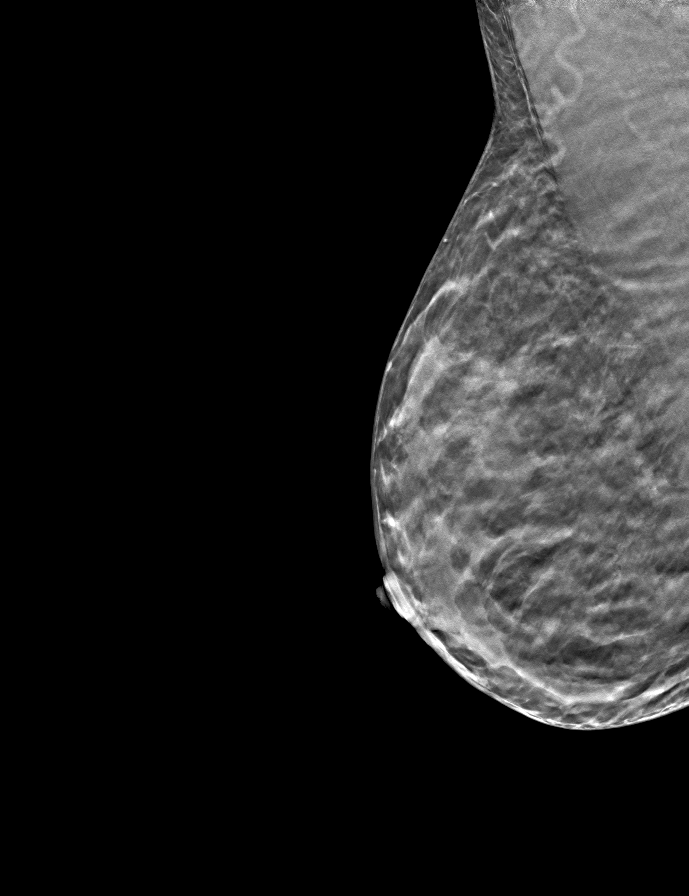
[frame 17/32]
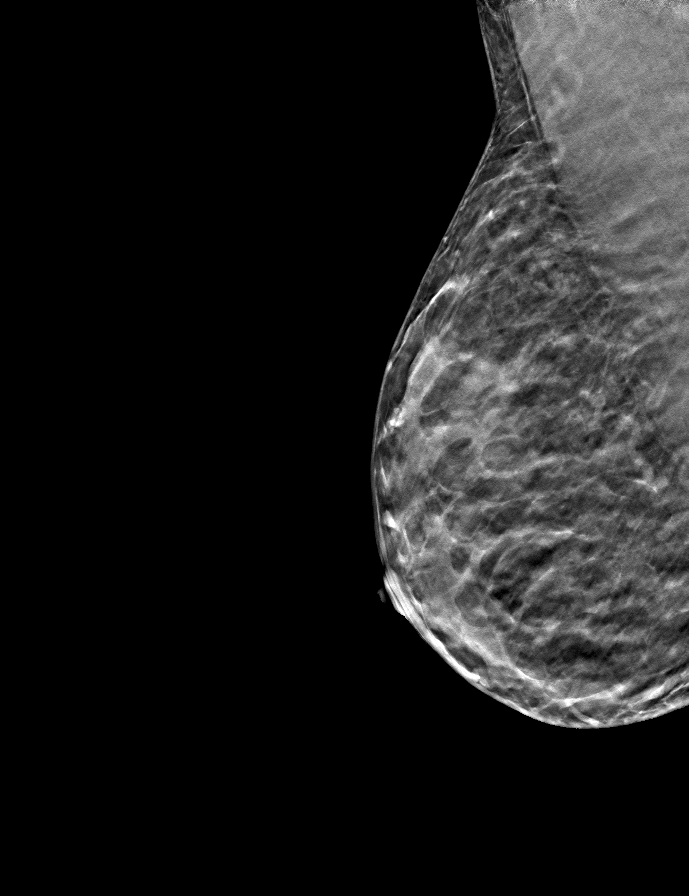

[L CC tomo · tomo slice 16/31.0]
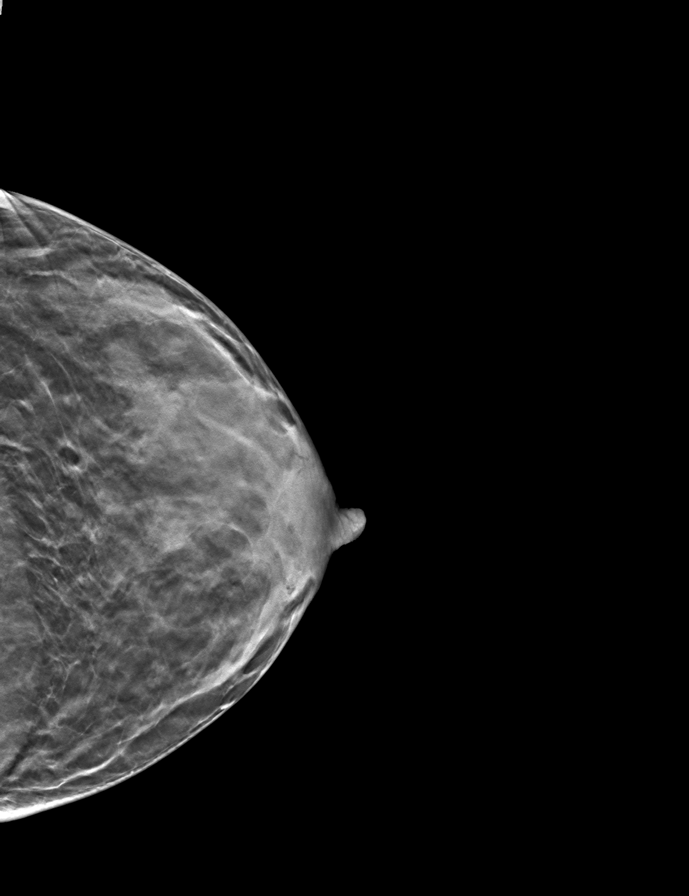

[L MLO tomo · tomo slice 17/33.0]
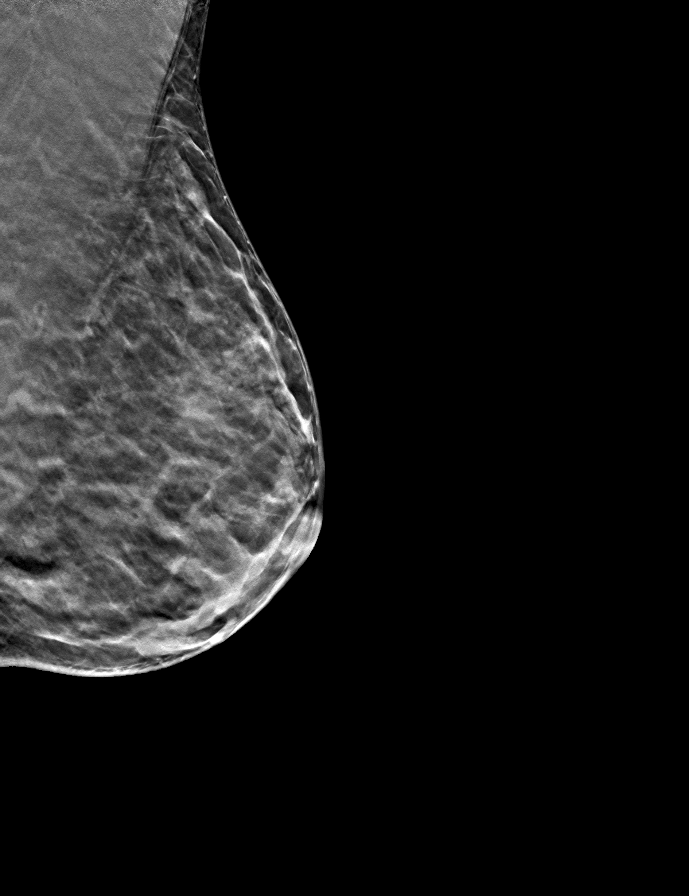

[R CC tomo · tomo slice 15/30.0]
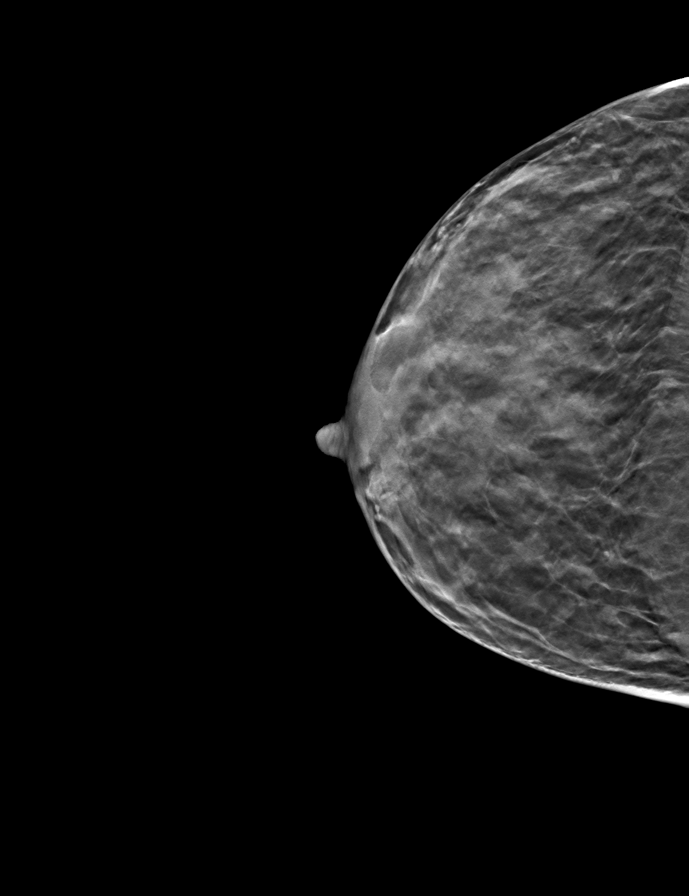

[9 of 24 positions shown; findings below may reference images not displayed]

ACR Breast Density Category c: The breast tissue is heterogeneously
dense, which may obscure small masses
FINDINGS: There are no findings suspicious for malignancy.
IMPRESSION: No mammographic evidence of malignancy. A result letter of this
screening mammogram will be mailed directly to the patient.

RECOMMENDATION:
Screening mammogram in one year. (Code:[6Z])

BI-RADS CATEGORY  1: Negative.

## 2020-10-21 NOTE — Progress Notes (Signed)
10/21/2020     Endocrinology Consult Note    Subjective:    Patient ID: Kimberly Rocha, female    DOB: [birth year], PCP Anabel Halon, MD.   Past Medical History:  Diagnosis Date   Borderline high cholesterol    Borderline hypertension    Gallstones    Thyroid disease     Past Surgical History:  Procedure Laterality Date   CESAREAN SECTION     CHOLECYSTECTOMY N/A 08/22/2012   Procedure: LAPAROSCOPIC CHOLECYSTECTOMY;  Surgeon: Dalia Heading, MD;  Location: [facility];  Service: General;  Laterality: N/A;   GALLBLADDER SURGERY     HERNIA REPAIR     TUBAL LIGATION      Social History   Socioeconomic History   Marital status: Married    Spouse name: Not on file   Number of children: Not on file   Years of education: Not on file   Highest education level: Not on file  Occupational History   Not on file  Tobacco Use   Smoking status: Former    Packs/day: 0.50    Types: Cigarettes   Smokeless tobacco: Never  Vaping Use   Vaping Use: Former  Substance and Sexual Activity   Alcohol use: Yes    Comment: occ   Drug use: Yes    Frequency: 3.0 times per week    Types: Marijuana    Comment: twice a day   Sexual activity: Yes    Birth control/protection: Surgical    Comment: tubal  Other Topics Concern   Not on file  Social History Narrative   Not on file   Social Determinants of Health   Financial Resource Strain: Medium Risk   Difficulty of Paying Living Expenses: Somewhat hard  Food Insecurity: Food Insecurity Present   Worried About Running Out of Food in the Last Year: Sometimes true   Ran Out of Food in the Last Year: Sometimes true  Transportation Needs: No Transportation Needs   Lack of Transportation (Medical): No   Lack of Transportation (Non-Medical): No  Physical Activity: Insufficiently Active   Days of Exercise per Week: 2 days   Minutes of Exercise per Session: 20 min  Stress: Stress Concern Present   Feeling of Stress : Very much   Social Connections: Moderately Isolated   Frequency of Communication with Friends and Family: More than three times a week   Frequency of Social Gatherings with Friends and Family: Twice a week   Attends Religious Services: More than 4 times per year   Active Member of [employer] or Organizations: No   Attends Banker Meetings: Never   Marital Status: Never married    Family History  Problem Relation Age of Onset   COPD Father    COPD Mother    HIV/AIDS Brother    Other Daughter        has issues with cervix    Outpatient Encounter Medications as of 10/21/2020  Medication Sig   apixaban (ELIQUIS) 5 MG TABS tablet Take 5 mg by mouth daily.   cetirizine (ZYRTEC) 10 MG tablet Take 1 tablet (10 mg total) by mouth daily.   clindamycin (CLINDAGEL) 1 % gel Apply to affected area 2 times daily   cyclobenzaprine (FLEXERIL) 10 MG tablet Take 1 tablet (10 mg total) by mouth 3 (three) times daily as needed for muscle spasms.   escitalopram (LEXAPRO) 10 MG tablet Take 1 tablet (10 mg total) by mouth daily.   hydrOXYzine (VISTARIL)  50 MG capsule Take 1 capsule (50 mg total) by mouth 3 (three) times daily as needed.   methimazole (TAPAZOLE) 5 MG tablet Take 1 tablet (5 mg total) by mouth daily.   mirtazapine (REMERON) 45 MG tablet Take 1 tablet (45 mg total) by mouth at bedtime.   nystatin (MYCOSTATIN) 100000 UNIT/ML suspension Take 5 mLs (500,000 Units total) by mouth 4 (four) times daily.   rosuvastatin (CRESTOR) 10 MG tablet TAKE 1 TABLET BY MOUTH DAILY   triamcinolone cream (KENALOG) 0.1 % Apply 1 application topically 2 (two) times daily.   valACYclovir (VALTREX) 1000 MG tablet Take 1 tablet (1,000 mg total) by mouth 2 (two) times daily.   methylPREDNISolone (MEDROL DOSEPAK) 4 MG TBPK tablet Take as package instructions. (Patient not taking: Reported on 10/21/2020)   topiramate (TOPAMAX) 25 MG tablet Take 25 mg by mouth 2 (two) times daily. (Patient not taking: Reported on 10/21/2020)    No facility-administered encounter medications on file as of 10/21/2020.    ALLERGIES: Allergies  Allergen Reactions   Demerol Hives and Itching    VACCINATION STATUS: Immunization History  Administered Date(s) Administered   Moderna Sars-Covid-2 Vaccination 02/27/2020, 03/27/2020     HPI  Kimberly Rocha is 45 y.o. female who presents today with a medical history as above. she is being seen in consultation for hyperthyroidism requested by Anabel Halon, MD.  she has been dealing with symptoms of unintentional weight loss, tremors, fatigue, shortness of breath with exertion, hair loss, anxiety, rash, and tingling in her fingers for approximately 2 months. These symptoms are progressively worsening and troubling to her.  her most recent thyroid labs revealed mildly suppressed TSH of  0.326 on 07/25/20 and again on 08/29/20 therefore she was started on Methimazole 5 mg po daily.  she denies dysphagia, choking, no recent voice change.    she denies family history of thyroid dysfunction and denies family hx of thyroid cancer. she denies personal history of goiter. she is not on any thyroid hormone supplements, has been taking antithyroid medication for approximately 30 days. Denies use of Biotin containing supplements.  she is willing to proceed with appropriate work up and therapy for thyrotoxicosis.   Review of systems  Constitutional: + drastic weight loss- per patient, current Body mass index is 22.23 kg/m., no fatigue, + subjective hyperthermia (since starting Methimazole), no subjective hypothermia Eyes: no blurry vision, no xerophthalmia ENT: no sore throat, no nodules palpated in throat, no dysphagia/odynophagia, no hoarseness Cardiovascular: no chest pain, + shortness of breath with exertion, no palpitations, no leg swelling Respiratory: no cough, + shortness of breath Gastrointestinal: no nausea/vomiting/diarrhea Musculoskeletal: no muscle/joint aches Skin: diffuse rash-  since cleared up, no hyperemia Neurological: + tremors, + numbness/tingling to 3rd and 4th fingers bilaterally, no dizziness Psychiatric: no depression, + anxiety   Objective:    BP 116/76   Pulse 98   Ht 5' (1.524 m)   Wt 113 lb 12.8 oz (51.6 kg)   BMI 22.23 kg/m   Wt Readings from Last 3 Encounters:  10/21/20 113 lb 12.8 oz (51.6 kg)  10/09/20 112 lb 0.6 oz (50.8 kg)  09/17/20 107 lb (48.5 kg)     BP Readings from Last 3 Encounters:  10/21/20 116/76  10/09/20 125/81  09/17/20 128/82                          Physical Exam- Limited  Constitutional:  Body mass index is 22.23 kg/m. ,  not in acute distress, normal state of mind Eyes:  EOMI, no exophthalmos Neck: Supple Thyroid: No gross goiter, mildly enlarged thyroid R>L Cardiovascular: RRR, no murmurs, rubs, or gallops, no edema Respiratory: Adequate breathing efforts, no crackles, rales, rhonchi, or wheezing Musculoskeletal: no gross deformities, strength intact in all four extremities, no gross restriction of joint movements Skin:  no rashes, no hyperemia Neurological: no tremor with outstretched hands   CMP     Component Value Date/Time   NA 143 07/25/2020 1029   K 4.5 07/25/2020 1029   CL 98 07/25/2020 1029   CO2 23 07/25/2020 1029   GLUCOSE 109 (H) 07/25/2020 1029   GLUCOSE 104 (H) 04/21/2020 1055   BUN 7 07/25/2020 1029   CREATININE 0.83 07/25/2020 1029   CALCIUM 9.6 08/29/2020 1000   PROT 7.1 07/25/2020 1029   ALBUMIN 4.5 07/25/2020 1029   AST 11 07/25/2020 1029   ALT 8 07/25/2020 1029   ALKPHOS 79 07/25/2020 1029   BILITOT 0.3 07/25/2020 1029   GFRNONAA >60 04/21/2020 1055   GFRAA >60 10/17/2018 1545     CBC    Component Value Date/Time   WBC 16.5 (H) 08/29/2020 1000   WBC 8.8 04/21/2020 1055   RBC 3.69 (L) 08/29/2020 1000   RBC 4.01 04/21/2020 1055   HGB 12.3 08/29/2020 1000   HCT 36.1 08/29/2020 1000   PLT 209 08/29/2020 1000   MCV 98 (H) 08/29/2020 1000   MCH 33.3 (H) 08/29/2020  1000   MCH 33.9 04/21/2020 1055   MCHC 34.1 08/29/2020 1000   MCHC 35.1 04/21/2020 1055   RDW 13.1 08/29/2020 1000   LYMPHSABS 3.0 08/29/2020 1000   MONOABS 0.6 04/21/2020 1055   EOSABS 0.0 08/29/2020 1000   BASOSABS 0.0 08/29/2020 1000     Diabetic Labs (most recent): No results found for: HGBA1C  Lipid Panel     Component Value Date/Time   CHOL 233 (H) 07/25/2020 1029   TRIG 63 07/25/2020 1029   HDL 47 07/25/2020 1029   CHOLHDL 5.0 (H) 07/25/2020 1029   LDLCALC 175 (H) 07/25/2020 1029   LABVLDL 11 07/25/2020 1029     Lab Results  Component Value Date   TSH 0.320 (L) 08/29/2020   TSH 0.326 (L) 07/25/2020   FREET4 1.06 08/29/2020        Assessment & Plan:   1. Hyperthyroidism-unspecified  she is being seen at a kind request of Anabel Halon, MD.  her history and most recent labs are reviewed, and she was examined clinically. Subjective and objective findings are consistent with thyrotoxicosis likely from primary hyperthyroidism. The potential risks of untreated thyrotoxicosis and the need for definitive therapy have been discussed in detail with her, and she agrees to proceed with diagnostic workup and treatment plan.   She is advised to stop the Methimazole 5 mg po daily and after 5 days get her labs done.  I will repeat full profile thyroid function tests today (including antibody testing to rule out autoimmune thyroid dysfunction) and confirmatory thyroid uptake and scan will be scheduled to be done as soon as possible (she is advised to hold off on the test until I reach out to her regarding her labs).   Options of therapy are discussed with her.  We discussed the option of treating it with medications including methimazole or PTU which may have side effects including rash, transaminitis, and bone marrow suppression.  We also discussed the option of definitive therapy with RAI ablation of the thyroid. If  she is found to have primary hyperthyroidism from Graves'  disease , toxic multinodular goiter or toxic nodular goiter the preferred modality of treatment would be I-131 thyroid ablation. Surgery is another choice of treatment in some cases, in her case surgery is not a good fit for presentation with only mild goiter.  -Patient is made aware of the high likelihood of post ablative hypothyroidism with subsequent need for lifelong thyroid hormone replacement. she understands this outcome and she is willing to proceed.      she will return in 3 weeks for treatment decision.   I did not initiate any new prescriptions at today's visit.    -Patient is advised to maintain close follow up with Anabel Halon, MD for primary care needs.   - Time spent with the patient: 60 minutes, of which >50% was spent in obtaining information about her symptoms, reviewing her previous labs, evaluations, and treatments, counseling her about her hyperthyroidism , and developing a plan to confirm the diagnosis and long term treatment as necessary. Please refer to "Patient Self Inventory" in the Media tab for reviewed elements of pertinent patient history.  Kimberly Rocha participated in the discussions, expressed understanding, and voiced agreement with the above plans.  All questions were answered to her satisfaction. she is encouraged to contact clinic should she have any questions or concerns prior to her return visit.   Follow up plan: Return in about 3 weeks (around 11/11/2020) for Thyroid follow up, Previsit labs; uptake and scan.   Thank you for involving me in the care of this pleasant patient, and I will continue to update you with her progress.    Ronny Bacon, [facility] [address] [place], [place] [address] Phone: [phone] Fax: [phone]  10/21/2020, 9:35 AM

## 2020-10-21 NOTE — Patient Instructions (Signed)
Hyperthyroidism  Hyperthyroidism is when the thyroid gland is too active (overactive). The thyroid gland is a small gland located in the lower front part of the neck, just in front of the windpipe (trachea). This gland makes hormones that help control how the body uses food for energy (metabolism) as well as how the heart and brain function. These hormones also play a role in keeping your bones strong. When the thyroid is overactive, it produces toomuch of a hormone called thyroxine. What are the causes? This condition may be caused by: Graves' disease. This is a disorder in which the body's disease-fighting system (immune system) attacks the thyroid gland. This is the most common cause. Inflammation of the thyroid gland. A tumor in the thyroid gland. Use of certain medicines, including: Prescription thyroid hormone replacement. Herbal supplements that mimic thyroid hormones. Amiodarone therapy. Solid or fluid-filled lumps within your thyroid gland (thyroid nodules). Taking in a large amount of iodine from foods or medicines. What increases the risk? You are more likely to develop this condition if: You are female. You have a family history of thyroid conditions. You smoke tobacco. You use a medicine called lithium. You take medicines that affect the immune system (immunosuppressants). What are the signs or symptoms? Symptoms of this condition include: Nervousness. Inability to tolerate heat. Unexplained weight loss. Diarrhea. Change in the texture of hair or skin. Heart skipping beats or making extra beats. Rapid heart rate. Loss of menstruation. Shaky hands. Fatigue. Restlessness. Sleep problems. Enlarged thyroid gland or a lump in the thyroid (nodule). You may also have symptoms of Graves' disease, which may include: Protruding eyes. Dry eyes. Red or swollen eyes. Problems with vision. How is this diagnosed? This condition may be diagnosed based on: Your symptoms and  medical history. A physical exam. Blood tests. Thyroid ultrasound. This test involves using sound waves to produce images of the thyroid gland. A thyroid scan. A radioactive substance is injected into a vein, and images show how much iodine is present in the thyroid. Radioactive iodine uptake test (RAIU). A small amount of radioactive iodine is given by mouth to see how much iodine the thyroid absorbs after a certain amount of time. How is this treated? Treatment depends on the cause and severity of the condition. Treatment may include: Medicines to reduce the amount of thyroid hormone your body makes. Radioactive iodine treatment (radioiodine therapy). This involves swallowing a small dose of radioactive iodine, in capsule or liquid form, to kill thyroid cells. Surgery to remove part or all of your thyroid gland. You may need to take thyroid hormone replacement medicine for the rest of your life after thyroid surgery. Medicines to help manage your symptoms. Follow these instructions at home:  Take over-the-counter and prescription medicines only as told by your health care provider. Do not use any products that contain nicotine or tobacco, such as cigarettes and e-cigarettes. If you need help quitting, ask your health care provider. Follow any instructions from your health care provider about diet. You may be instructed to limit foods that contain iodine. Keep all follow-up visits as told by your health care provider. This is important. You will need to have blood tests regularly so that your health care provider can monitor your condition. Contact a health care provider if: Your symptoms do not get better with treatment. You have a fever. You are taking thyroid hormone replacement medicine and you: Have symptoms of depression. Feel like you are tired all the time. Gain weight. Get help right   away if: You have chest pain. You have decreased alertness or a change in your awareness. You  have abdominal pain. You feel dizzy. You have a rapid heartbeat. You have an irregular heartbeat. You have difficulty breathing. Summary The thyroid gland is a small gland located in the lower front part of the neck, just in front of the windpipe (trachea). Hyperthyroidism is when the thyroid gland is too active (overactive) and produces too much of a hormone called thyroxine. The most common cause is Graves' disease, a disorder in which your immune system attacks the thyroid gland. Hyperthyroidism can cause various symptoms, such as unexplained weight loss, nervousness, inability to tolerate heat, or changes in your heartbeat. Treatment may include medicine to reduce the amount of thyroid hormone your body makes, radioiodine therapy, surgery, or medicines to manage symptoms. This information is not intended to replace advice given to you by your health care provider. Make sure you discuss any questions you have with your healthcare provider. Document Revised: 09/28/2019 Document Reviewed: 09/28/2019 Elsevier Patient Education  2022 Elsevier Inc.  

## 2020-10-26 NOTE — BH Specialist Note (Addendum)
[place] Initial TeleMedicine Clinical Assessment  MRN: [id number] NAME: Kimberly Rocha Date: 08/31/20  Start time: 1 End time: 135 Total time: 35   Types of Service: Telephone visit Referring Provider: Dr. Posey Pronto Reason for Visit today: begin [facility]  Patient/Family location: home [place] Provider location: home office   I connected with patient and/or family via Telephone or Video Enabled Telemedicine Application  (Video is Caregility application) and verified that I am speaking with the correct person using two identifiers.   Discussed confidentiality: No   I discussed the limitations of telemedicine and the availability of in person appointments.  Discussed there is a possibility of technology failure and discussed alternative modes of communication if that failure occurs.  I discussed that engaging in this telemedicine visit, they consent to the provision of behavioral healthcare and the services will be billed under their insurance.  Patient and/or legal guardian expressed understanding and consented to Telemedicine visit: No   Treatment History Patient recently received Inpatient Treatment: No  Patient currently being seen by therapist/psychiatrist: No  Patient currently receiving the following services: no  Past Psychiatric History/Diagnosis/Hospitalization(s): Anxiety: No Bipolar Disorder: No Depression: No Mania: No Psychosis: No Schizophrenia: No Personality Disorder: No Hospitalization for psychiatric illness: No History of Electroconvulsive Shock Therapy: No Prior Suicide Attempts: No  Decreased need for sleep: No  Euphoria: No  Self Injurious behaviors: No  Family History of mental illness: No  Family History of substance abuse: No  Substance Abuse: No  DUI: No  Insomnia: No   History of violence: No  Physical, sexual or emotional abuse: No  Prior outpatient mental health therapy: No   Clinical Assessment  PHQ-9 & GAD-7  Assessments: This is an evidence based assessment tool for depression and anxiety symptoms in adolescents and adults.  Score cut-off points for each section are as follows: 5-9: Mild, 10-14: Moderate, 15+: Severe PHQ9 SCORE ONLY 10/09/2020 09/17/2020 09/09/2020  PHQ-9 Total Score _0 GAD 7 : Generalized Anxiety Score 09/09/2020 07/18/2020  Nervous, Anxious, on Edge 3 1  Control/stop worrying 3 3  Worry too much - different things 3 3  Trouble relaxing 3 3  Restless 3 1  Easily annoyed or irritable 3 3  Afraid - awful might happen 3 2  Total GAD 7 Score 21 16  Anxiety Difficulty - Somewhat difficult     Social maturity: WNL Social judgement: WNL  Stress Current stressors: bereavement Familial stressors: none Sleep: fair Appetite: fair Coping ability: overwhelmed Patient taking medications as prescribed:  yes  Current medications:  Outpatient Encounter Medications as of 08/29/2020  Medication Sig   apixaban (ELIQUIS) 5 MG TABS tablet Take 5 mg by mouth daily.   cyclobenzaprine (FLEXERIL) 10 MG tablet Take 1 tablet (10 mg total) by mouth 3 (three) times daily as needed for muscle spasms.   [EXPIRED] predniSONE (DELTASONE) 10 MG tablet Take 6 tablets (60 mg total) by mouth daily with breakfast for 2 days, THEN 5 tablets (50 mg total) daily with breakfast for 2 days, THEN 4 tablets (40 mg total) daily with breakfast for 2 days, THEN 3 tablets (30 mg total) daily with breakfast for 2 days, THEN 2 tablets (20 mg total) daily with breakfast for 2 days, THEN 1 tablet (10 mg total) daily with breakfast for 2 days.   topiramate (TOPAMAX) 25 MG tablet Take 25 mg by mouth 2 (two) times daily. (Patient not taking: Reported on 10/21/2020)   triamcinolone cream (KENALOG)  0.1 % Apply 1 application topically 2 (two) times daily.   [EXPIRED] valACYclovir (VALTREX) 1000 MG tablet Take 1 tablet (1,000 mg total) by mouth 3 (three) times daily for 7 days.   [DISCONTINUED] DIALYVITE VITAMIN D3 MAX 1.25  MG (50000 UT) TABS Take 1 tablet by mouth once a week.   [DISCONTINUED] escitalopram (LEXAPRO) 5 MG tablet Take 1 tablet (5 mg total) by mouth daily.   [DISCONTINUED] hydrOXYzine (VISTARIL) 50 MG capsule Take 50 mg by mouth 3 (three) times daily as needed.   [DISCONTINUED] mirtazapine (REMERON) 45 MG tablet Take 45 mg by mouth at bedtime.   [DISCONTINUED] rosuvastatin (CRESTOR) 10 MG tablet Take 1 tablet (10 mg total) by mouth daily.   No facility-administered encounter medications on file as of 08/29/2020.     Self-harm and/or Suicidal Behaviors Risk Assessment Self-harm risk factors: no Patient endorses recent self injurious thoughts and/or behaviors: No  Suicide ideations: No plan to harm self or others   Danger to Others Risk Assessment Danger to others risk factors: no Patient endorses recent thoughts of harming others: No    Substance Use Assessment Patient recently consumed alcohol: No  Patient recently used drugs: No  Patient is concerned about dependence or abuse of substances: No    Goals, Interventions and Follow-up Plan Goals: Increase healthy adjustment to current life circumstances Interventions: Solution-Focused Strategies Follow-up Plan:  weekly [facility]  Summary of Clinical Assessment Summary: Kimberly Rocha is a 45 yr old woman referred by Dr. Posey Pronto to assist with her mood. Patient described her childhood and the trauma that she experienced.  Kimberly Rocha has a desire to improved her mood as she frequently gets frustrated. Provided psychoeducation on V[facility] and the effects of trauma. Therapist met with Patient in an initial therapy session to assess current mood and to build rapport. Therapist engaged Patient in discussion about her life and what is going well for her. Therapist provided support for Patient as she shared details about her life, her current stressors, mood, coping skills, her past, and her children. Therapist prompted Patient to discuss her support system and ways that she  manages her daily stress, anger, and frustrations.     Kimberly South, LCSW

## 2020-10-27 NOTE — BH Specialist Note (Addendum)
[facility] [facility] Follow Up Assessment  MRN: [id number] NAME: Kimberly Rocha Date: 09/12/2020  Start time: 130 End time: 200 Total time: 30  Type of Contact: Follow up Call  Current concerns/stressors: frustration    Functional Assessment:  Sleep: fair Appetite: fair Coping ability: overwhelmed Patient taking medications as prescribed:    Current medications:  Outpatient Encounter Medications as of 09/12/2020  Medication Sig   apixaban (ELIQUIS) 5 MG TABS tablet Take 5 mg by mouth daily.   cyclobenzaprine (FLEXERIL) 10 MG tablet Take 1 tablet (10 mg total) by mouth 3 (three) times daily as needed for muscle spasms.   topiramate (TOPAMAX) 25 MG tablet Take 25 mg by mouth 2 (two) times daily. (Patient not taking: Reported on 10/21/2020)   triamcinolone cream (KENALOG) 0.1 % Apply 1 application topically 2 (two) times daily.   [DISCONTINUED] escitalopram (LEXAPRO) 5 MG tablet Take 1 tablet (5 mg total) by mouth daily.   [DISCONTINUED] hydrOXYzine (VISTARIL) 50 MG capsule Take 50 mg by mouth 3 (three) times daily as needed.   [DISCONTINUED] mirtazapine (REMERON) 45 MG tablet Take 45 mg by mouth at bedtime.   [DISCONTINUED] rosuvastatin (CRESTOR) 10 MG tablet Take 1 tablet (10 mg total) by mouth daily.   No facility-administered encounter medications on file as of 09/12/2020.    Self-harm and/or Suicidal Behaviors Risk Assessment Self-harm risk factors: no Patient endorses recent self injurious thoughts and/or behaviors: No   Suicide ideations: No plan to harm self or others   Danger to Others Risk Assessment Danger to others risk factors: no Patient endorses recent thoughts of harming others: No    Substance Use Assessment Patient recently consumed alcohol: No  Patient recently used drugs: No  Patient is concerned about dependence or abuse of substances: No    Goals, Interventions and Follow-up Plan Goals: Increase healthy adjustment to current life  circumstances Interventions: Solution-Focused Strategies   Summary of Clinical Assessment  Kimberly Rocha is a 45 yr old present for follow up. Kimberly Rocha reports that she has been frustrated lately. She described an incident that took place in [place] where she felt a worker was following her.  She was able to describe her thought process at the time and is developing coping skills to reduce frustration.   Follow-up Plan:  Continue weekly [facility] Marinda Elk, LCSW

## 2020-10-28 ENCOUNTER — Other Ambulatory Visit: Payer: Self-pay

## 2020-10-28 ENCOUNTER — Encounter (HOSPITAL_COMMUNITY)
Admission: RE | Admit: 2020-10-28 | Discharge: 2020-10-28 | Disposition: A | Discharge status: Home / Self Care | Payer: Self-pay | Source: Ambulatory Visit | Attending: Nurse Practitioner | Admitting: Nurse Practitioner

## 2020-10-28 ENCOUNTER — Encounter: Payer: Self-pay | Admitting: Nurse Practitioner

## 2020-10-28 DIAGNOSIS — E0590 Thyrotoxicosis, unspecified without thyrotoxic crisis or storm: Secondary | ICD-10-CM | POA: Insufficient documentation

## 2020-10-28 IMAGING — NM NM THYROID IMAGING W/ UPTAKE MULTI (4&24 HR)
4 series · 4 of 4 positions shown · non-contrast
Comparison: Thyroid ultrasound [DATE]

CLINICAL DATA: Hyperthyroidism

EXAM:
THYROID SCAN AND UPTAKE - 4 AND 24 HOURS
TECHNIQUE: Following oral administration of [DF] capsule, anterior planar
imaging was acquired at 24 hours. Thyroid uptake was calculated with
a thyroid probe at 4-6 hours and 24 hours.
RADIOPHARMACEUTICALS:  431 uCi [DF] sodium iodide p.o.

[Series 1: anterior · 1.18mm/px · 1 of 1 slices shown]
[im 1/1  full-range]
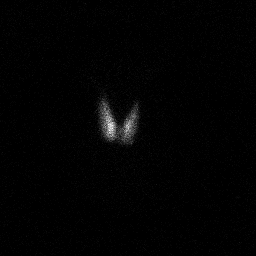

[Series 2: ant w marker · 1.18mm/px · 1 of 1 slices shown]
[im 1/1  full-range]
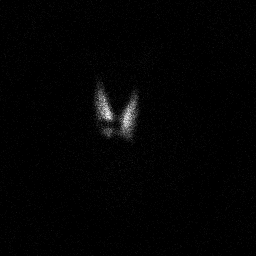

[Series 3: lao · 1.18mm/px · 1 of 1 slices shown]
[im 1/1  full-range]
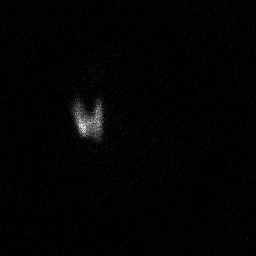

[Series 4: rao · 1.18mm/px · 1 of 1 slices shown]
[im 1/1  full-range]
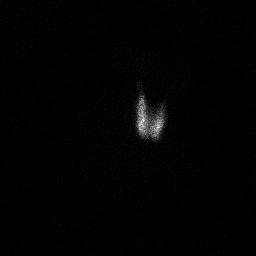

[4 of 4 positions shown; findings below may reference images not displayed]

FINDINGS: Homogeneous tracer distribution in both thyroid lobes.

No focal areas of increased or decreased tracer localization.

4 hour [DF] uptake = 21.7% (normal 5-20%)

24 hour [DF] uptake = 44.3% (normal 10-30%)
IMPRESSION: Normal thyroid scan with elevated 4 hour and 24 hour radio iodine
uptakes.

Findings discussed with hyperthyroidism and Graves disease.

## 2020-10-28 MED ORDER — SODIUM IODIDE I-123 7.4 MBQ CAPS
431.0000 | ORAL_CAPSULE | Freq: Once | ORAL | Status: AC
  Administered 2020-10-28: 431 via ORAL

## 2020-10-29 ENCOUNTER — Other Ambulatory Visit (HOSPITAL_COMMUNITY): Payer: Medicaid Other

## 2020-10-29 ENCOUNTER — Encounter (HOSPITAL_COMMUNITY): Payer: Self-pay

## 2020-10-29 ENCOUNTER — Encounter (HOSPITAL_COMMUNITY)
Admission: RE | Admit: 2020-10-29 | Discharge: 2020-10-29 | Disposition: A | Discharge status: Home / Self Care | Payer: Self-pay | Source: Ambulatory Visit | Attending: Nurse Practitioner | Admitting: Nurse Practitioner

## 2020-10-30 ENCOUNTER — Other Ambulatory Visit (HOSPITAL_COMMUNITY): Payer: Medicaid Other

## 2020-10-30 ENCOUNTER — Encounter: Payer: Self-pay | Admitting: Internal Medicine

## 2020-10-31 ENCOUNTER — Encounter: Payer: Self-pay | Admitting: Adult Health

## 2020-10-31 ENCOUNTER — Ambulatory Visit (INDEPENDENT_AMBULATORY_CARE_PROVIDER_SITE_OTHER): Payer: Self-pay | Admitting: Adult Health

## 2020-10-31 ENCOUNTER — Other Ambulatory Visit: Payer: Self-pay

## 2020-10-31 VITALS — BP 114/70 | HR 98 | Ht 60.0 in | Wt 114.0 lb

## 2020-10-31 DIAGNOSIS — N898 Other specified noninflammatory disorders of vagina: Secondary | ICD-10-CM

## 2020-10-31 DIAGNOSIS — L732 Hidradenitis suppurativa: Secondary | ICD-10-CM | POA: Insufficient documentation

## 2020-10-31 MED ORDER — CLINDAMYCIN PHOSPHATE 1 % EX GEL: CUTANEOUS | 1 refills | Status: DC

## 2020-10-31 MED ORDER — FLUCONAZOLE 150 MG PO TABS: ORAL_TABLET | ORAL | 1 refills | Status: DC

## 2020-10-31 NOTE — Telephone Encounter (Signed)
Appointment rescheduled.

## 2020-10-31 NOTE — Progress Notes (Signed)
  Subjective:     Patient ID: Kimberly Rocha, female   DOB: [birth year], 45 y.o.   MRN: [id number]  HPI Kimberly Rocha is a 45 year old black female,married, [id number] in complaining of dry skin and itching in vaginal area. Has stopped shaving.Clindamycin gel has helped. PCP is Dr Allena Katz. Lab Results  Component Value Date   DIAGPAP  09/09/2020    - Negative for intraepithelial lesion or malignancy (NILM)   HPVHIGH Negative 09/09/2020    Review of Systems Dry scaly skin and itching in vaginal area Reviewed past medical,surgical, social and family history. Reviewed medications and allergies.     Objective:   Physical Exam BP 114/70 (BP Location: Left Arm, Patient Position: Sitting, Cuff Size: Normal)   Pulse 98   Ht 5' (1.524 m)   Wt 114 lb (51.7 kg)   BMI 22.26 kg/m     Skin warm and dry.Pelvic: external genitalia has scarring from old boils, and folliculitis,vagina: scant white discharge without odor,urethra has no lesions or masses noted, cervix:smooth and bulbous, uterus: normal size, shape and contour, non tender, no masses felt, adnexa: no masses or tenderness noted. Bladder is non tender and no masses felt. Examination chaperoned by Faith Rogue LPN.  [facility] - 10/31/20 1410       Pregnancy Intention Screening   Does the patient want to become pregnant in the next year? No    Does the patient's partner want to become pregnant in the next year? No    Would the patient like to discuss contraceptive options today? No      Contraception Wrap Up   Current Method Female Sterilization    End Method Female Sterilization    Contraception Counseling Provided No             Assessment:     1. Itching in the vaginal area Will rx diflucan   2. Hidradenitis Will refill clindamycin gel Meds ordered this encounter  Medications   clindamycin (CLINDAGEL) 1 % gel    Sig: Apply to affected area 2 times daily    Dispense:  30 g    Refill:  1    Order Specific Question:   Supervising  Provider    Answer:   Despina Hidden, LUTHER H [[id number]]   fluconazole (DIFLUCAN) 150 MG tablet    Sig: Take 1 now and 1 in 3 days    Dispense:  2 tablet    Refill:  1    Order Specific Question:   Supervising Provider    Answer:   Lazaro Arms [[id number]]       Plan:     Follow prn

## 2020-11-07 ENCOUNTER — Other Ambulatory Visit: Payer: Self-pay | Admitting: *Deleted

## 2020-11-07 ENCOUNTER — Encounter: Payer: Self-pay | Admitting: Internal Medicine

## 2020-11-07 DIAGNOSIS — E0590 Thyrotoxicosis, unspecified without thyrotoxic crisis or storm: Secondary | ICD-10-CM

## 2020-11-07 DIAGNOSIS — F418 Other specified anxiety disorders: Secondary | ICD-10-CM

## 2020-11-07 MED ORDER — ESCITALOPRAM OXALATE 10 MG PO TABS: 10.0000 mg | ORAL_TABLET | Freq: Every day | ORAL | 2 refills | Status: DC

## 2020-11-07 MED ORDER — METHIMAZOLE 5 MG PO TABS: 5.0000 mg | ORAL_TABLET | Freq: Every day | ORAL | 2 refills | Status: DC

## 2020-11-10 ENCOUNTER — Encounter: Payer: Self-pay | Admitting: Internal Medicine

## 2020-11-11 ENCOUNTER — Other Ambulatory Visit: Payer: Self-pay | Admitting: *Deleted

## 2020-11-11 ENCOUNTER — Ambulatory Visit: Payer: Medicaid Other | Admitting: Nurse Practitioner

## 2020-11-11 DIAGNOSIS — R21 Rash and other nonspecific skin eruption: Secondary | ICD-10-CM

## 2020-11-11 DIAGNOSIS — L309 Dermatitis, unspecified: Secondary | ICD-10-CM

## 2020-11-11 NOTE — Telephone Encounter (Signed)
Referral placed.

## 2020-11-18 ENCOUNTER — Ambulatory Visit: Payer: Medicaid Other | Admitting: Internal Medicine

## 2020-11-27 ENCOUNTER — Other Ambulatory Visit: Payer: Self-pay | Admitting: *Deleted

## 2020-11-27 DIAGNOSIS — F418 Other specified anxiety disorders: Secondary | ICD-10-CM

## 2020-11-28 LAB — THYROGLOBULIN ANTIBODY: Thyroglobulin Antibody: <1 IU/mL (ref 0.0–0.9)

## 2020-11-28 LAB — T3, FREE: T3, Free: 2.9 pg/mL (ref 2.0–4.4)

## 2020-11-28 LAB — T4, FREE: Free T4: 0.98 ng/dL (ref 0.82–1.77)

## 2020-11-28 LAB — THYROID PEROXIDASE ANTIBODY: Thyroperoxidase Ab SerPl-aCnc: 9 IU/mL (ref 0–34)

## 2020-11-28 LAB — TSH: TSH: 1.29 u[IU]/mL (ref 0.450–4.500)

## 2020-11-29 ENCOUNTER — Encounter: Payer: Self-pay | Admitting: Internal Medicine

## 2020-11-29 ENCOUNTER — Encounter: Payer: Self-pay | Admitting: Nurse Practitioner

## 2020-11-29 ENCOUNTER — Other Ambulatory Visit: Payer: Self-pay

## 2020-11-29 ENCOUNTER — Ambulatory Visit (INDEPENDENT_AMBULATORY_CARE_PROVIDER_SITE_OTHER): Payer: Self-pay | Admitting: Internal Medicine

## 2020-11-29 ENCOUNTER — Ambulatory Visit (INDEPENDENT_AMBULATORY_CARE_PROVIDER_SITE_OTHER): Payer: Self-pay | Admitting: Nurse Practitioner

## 2020-11-29 VITALS — BP 126/79 | HR 92 | Ht 60.0 in | Wt 116.0 lb

## 2020-11-29 VITALS — BP 125/78 | HR 109 | Ht 60.0 in | Wt 116.8 lb

## 2020-11-29 DIAGNOSIS — E0500 Thyrotoxicosis with diffuse goiter without thyrotoxic crisis or storm: Secondary | ICD-10-CM

## 2020-11-29 DIAGNOSIS — E0590 Thyrotoxicosis, unspecified without thyrotoxic crisis or storm: Secondary | ICD-10-CM

## 2020-11-29 DIAGNOSIS — L732 Hidradenitis suppurativa: Secondary | ICD-10-CM

## 2020-11-29 DIAGNOSIS — R45851 Suicidal ideations: Secondary | ICD-10-CM | POA: Insufficient documentation

## 2020-11-29 DIAGNOSIS — G5601 Carpal tunnel syndrome, right upper limb: Secondary | ICD-10-CM

## 2020-11-29 DIAGNOSIS — F418 Other specified anxiety disorders: Secondary | ICD-10-CM

## 2020-11-29 LAB — TSH+T4F+T3FREE
Free T4: 0.94 ng/dL (ref 0.82–1.77)
T3, Free: 2.8 pg/mL (ref 2.0–4.4)
TSH: 1.37 u[IU]/mL (ref 0.450–4.500)

## 2020-11-29 MED ORDER — ESCITALOPRAM OXALATE 20 MG PO TABS: 20.0000 mg | ORAL_TABLET | Freq: Every day | ORAL | 2 refills | Status: DC

## 2020-11-29 MED ORDER — DOXYCYCLINE HYCLATE 100 MG PO TABS: 100.0000 mg | ORAL_TABLET | Freq: Two times a day (BID) | ORAL | 0 refills | Status: DC

## 2020-11-29 NOTE — Assessment & Plan Note (Signed)
Uncontrolled with Remeron 45 mg qHS and Vistaril 50 mg TID PRN Had added Lexapro 10 mg QD Referred to psychiatry/BH therapy

## 2020-11-29 NOTE — Assessment & Plan Note (Signed)
Not better with clindamycin gel Started doxycycline p.o. If persistent, will refer to general surgery

## 2020-11-29 NOTE — Progress Notes (Signed)
11/29/2020     Endocrinology Follow Up Note    Subjective:    Patient ID: Kimberly Rocha, female    DOB: [birth year], PCP Anabel Halon, MD.   Past Medical History:  Diagnosis Date   Borderline high cholesterol    Borderline hypertension    Gallstones    Thyroid disease     Past Surgical History:  Procedure Laterality Date   CESAREAN SECTION     CHOLECYSTECTOMY N/A 08/22/2012   Procedure: LAPAROSCOPIC CHOLECYSTECTOMY;  Surgeon: Dalia Heading, MD;  Location: [facility];  Service: General;  Laterality: N/A;   GALLBLADDER SURGERY     HERNIA REPAIR     TUBAL LIGATION      Social History   Socioeconomic History   Marital status: Married    Spouse name: Not on file   Number of children: Not on file   Years of education: Not on file   Highest education level: Not on file  Occupational History   Not on file  Tobacco Use   Smoking status: Former    Packs/day: 0.50    Types: Cigarettes   Smokeless tobacco: Never  Vaping Use   Vaping Use: Former  Substance and Sexual Activity   Alcohol use: Yes    Comment: occ   Drug use: Yes    Frequency: 3.0 times per week    Types: Marijuana    Comment: twice a day   Sexual activity: Yes    Birth control/protection: Surgical    Comment: tubal  Other Topics Concern   Not on file  Social History Narrative   Not on file   Social Determinants of Health   Financial Resource Strain: Medium Risk   Difficulty of Paying Living Expenses: Somewhat hard  Food Insecurity: Food Insecurity Present   Worried About Running Out of Food in the Last Year: Sometimes true   Ran Out of Food in the Last Year: Sometimes true  Transportation Needs: No Transportation Needs   Lack of Transportation (Medical): No   Lack of Transportation (Non-Medical): No  Physical Activity: Insufficiently Active   Days of Exercise per Week: 2 days   Minutes of Exercise per Session: 20 min  Stress: Stress Concern Present   Feeling of Stress : Very much   Social Connections: Moderately Isolated   Frequency of Communication with Friends and Family: More than three times a week   Frequency of Social Gatherings with Friends and Family: Twice a week   Attends Religious Services: More than 4 times per year   Active Member of [employer] or Organizations: No   Attends Banker Meetings: Never   Marital Status: Never married    Family History  Problem Relation Age of Onset   COPD Father    COPD Mother    HIV/AIDS Brother    Other Daughter        has issues with cervix    Outpatient Encounter Medications as of 11/29/2020  Medication Sig   cetirizine (ZYRTEC) 10 MG tablet Take 1 tablet (10 mg total) by mouth daily.   clindamycin (CLINDAGEL) 1 % gel Apply to affected area 2 times daily   escitalopram (LEXAPRO) 10 MG tablet Take 1 tablet (10 mg total) by mouth daily.   hydrOXYzine (VISTARIL) 50 MG capsule Take 1 capsule (50 mg total) by mouth 3 (three) times daily as needed.   methimazole (TAPAZOLE) 5 MG tablet Take 1 tablet (5 mg total) by mouth daily.   mirtazapine (REMERON)  45 MG tablet Take 1 tablet (45 mg total) by mouth at bedtime.   nystatin (MYCOSTATIN) 100000 UNIT/ML suspension Take 5 mLs (500,000 Units total) by mouth 4 (four) times daily.   rosuvastatin (CRESTOR) 10 MG tablet TAKE 1 TABLET BY MOUTH DAILY   triamcinolone cream (KENALOG) 0.1 % Apply 1 application topically 2 (two) times daily.   valACYclovir (VALTREX) 1000 MG tablet Take 1 tablet (1,000 mg total) by mouth 2 (two) times daily.   apixaban (ELIQUIS) 5 MG TABS tablet Take 5 mg by mouth daily. (Patient not taking: Reported on 11/29/2020)   cyclobenzaprine (FLEXERIL) 10 MG tablet Take 1 tablet (10 mg total) by mouth 3 (three) times daily as needed for muscle spasms. (Patient not taking: Reported on 11/29/2020)   fluconazole (DIFLUCAN) 150 MG tablet Take 1 now and 1 in 3 days (Patient not taking: Reported on 11/29/2020)   methylPREDNISolone (MEDROL DOSEPAK) 4 MG TBPK  tablet Take as package instructions. (Patient not taking: No sig reported)   topiramate (TOPAMAX) 25 MG tablet Take 25 mg by mouth 2 (two) times daily. (Patient not taking: No sig reported)   No facility-administered encounter medications on file as of 11/29/2020.    ALLERGIES: Allergies  Allergen Reactions   Demerol Hives and Itching    VACCINATION STATUS: Immunization History  Administered Date(s) Administered   Moderna Sars-Covid-2 Vaccination 02/27/2020, 03/27/2020     HPI  Kimberly Rocha is 45 y.o. female who presents today with a medical history as above. she is being seen in follow up after being seen in consultation for hyperthyroidism requested by Anabel Halon, MD.  she has been dealing with symptoms of unintentional weight loss, tremors, fatigue, shortness of breath with exertion, hair loss, anxiety, rash, and tingling in her fingers for approximately 2 months. These symptoms are progressively worsening and troubling to her.  her most recent thyroid labs revealed mildly suppressed TSH of  0.326 on 07/25/20 and again on 08/29/20 therefore she was started on Methimazole 5 mg po daily.  she denies dysphagia, choking, no recent voice change.    she denies family history of thyroid dysfunction and denies family hx of thyroid cancer. she denies personal history of goiter. she is not on any thyroid hormone supplements, has been taking antithyroid medication for approximately 30 days. Denies use of Biotin containing supplements.  she is willing to proceed with appropriate work up and therapy for thyrotoxicosis.   Review of systems  Constitutional: + stable body weight, current Body mass index is 22.81 kg/m., no fatigue, + subjective hyperthermia (since starting Methimazole), no subjective hypothermia Eyes: no blurry vision, no xerophthalmia ENT: no sore throat, no nodules palpated in throat, no dysphagia/odynophagia, no hoarseness, thrush- treated with nystatin Cardiovascular: no  chest pain, + shortness of breath with exertion, no palpitations, no leg swelling Respiratory: no cough, + shortness of breath Gastrointestinal: no nausea/vomiting/diarrhea Musculoskeletal: no muscle/joint aches, intermittent leg weakness Skin: diffuse rash- since cleared up, dry skin and nails, no hyperemia Neurological: + tremors, + numbness/tingling to 3rd and 4th fingers bilaterally, no dizziness Psychiatric: no depression, + anxiety   Objective:    BP 125/78   Pulse (!) 109   Ht 5' (1.524 m)   Wt 116 lb 12.8 oz (53 kg)   BMI 22.81 kg/m   Wt Readings from Last 3 Encounters:  11/29/20 116 lb 12.8 oz (53 kg)  10/31/20 114 lb (51.7 kg)  10/21/20 113 lb 12.8 oz (51.6 kg)     BP Readings from  Last 3 Encounters:  11/29/20 125/78  10/31/20 114/70  10/21/20 116/76                          Physical Exam- Limited  Constitutional:  Body mass index is 22.81 kg/m. , not in acute distress, anxious state of mind Eyes:  EOMI, no exophthalmos Neck: Supple Thyroid: No gross goiter, mildly enlarged thyroid R>L Cardiovascular: RRR, no murmurs, rubs, or gallops, no edema Respiratory: Adequate breathing efforts, no crackles, rales, rhonchi, or wheezing Musculoskeletal: no gross deformities, strength intact in all four extremities, no gross restriction of joint movements Skin:  no rashes, no hyperemia Neurological: no tremor with outstretched hands   CMP     Component Value Date/Time   NA 143 07/25/2020 1029   K 4.5 07/25/2020 1029   CL 98 07/25/2020 1029   CO2 23 07/25/2020 1029   GLUCOSE 109 (H) 07/25/2020 1029   GLUCOSE 104 (H) 04/21/2020 1055   BUN 7 07/25/2020 1029   CREATININE 0.83 07/25/2020 1029   CALCIUM 9.6 08/29/2020 1000   PROT 7.1 07/25/2020 1029   ALBUMIN 4.5 07/25/2020 1029   AST 11 07/25/2020 1029   ALT 8 07/25/2020 1029   ALKPHOS 79 07/25/2020 1029   BILITOT 0.3 07/25/2020 1029   GFRNONAA >60 04/21/2020 1055   GFRAA >60 10/17/2018 1545     CBC     Component Value Date/Time   WBC 16.5 (H) 08/29/2020 1000   WBC 8.8 04/21/2020 1055   RBC 3.69 (L) 08/29/2020 1000   RBC 4.01 04/21/2020 1055   HGB 12.3 08/29/2020 1000   HCT 36.1 08/29/2020 1000   PLT 209 08/29/2020 1000   MCV 98 (H) 08/29/2020 1000   MCH 33.3 (H) 08/29/2020 1000   MCH 33.9 04/21/2020 1055   MCHC 34.1 08/29/2020 1000   MCHC 35.1 04/21/2020 1055   RDW 13.1 08/29/2020 1000   LYMPHSABS 3.0 08/29/2020 1000   MONOABS 0.6 04/21/2020 1055   EOSABS 0.0 08/29/2020 1000   BASOSABS 0.0 08/29/2020 1000     Diabetic Labs (most recent): No results found for: HGBA1C  Lipid Panel     Component Value Date/Time   CHOL 233 (H) 07/25/2020 1029   TRIG 63 07/25/2020 1029   HDL 47 07/25/2020 1029   CHOLHDL 5.0 (H) 07/25/2020 1029   LDLCALC 175 (H) 07/25/2020 1029   LABVLDL 11 07/25/2020 1029     Lab Results  Component Value Date   TSH 1.290 11/27/2020   TSH 1.370 11/27/2020   TSH 0.320 (L) 08/29/2020   TSH 0.326 (L) 07/25/2020   FREET4 0.98 11/27/2020   FREET4 0.94 11/27/2020   FREET4 1.06 08/29/2020    Results for Nulty, Caydee A "TOYE" (MRN [id number]) as of 11/29/2020 09:49  Ref. Range 11/27/2020 13:14 11/27/2020 13:15  TSH Latest Ref Range: 0.450 - 4.500 uIU/mL 1.370 1.290  Triiodothyronine,Free,Serum Latest Ref Range: 2.0 - 4.4 pg/mL 2.8 2.9  T4,Free(Direct) Latest Ref Range: 0.82 - 1.77 ng/dL 7.37 1.06  Thyroperoxidase Ab SerPl-aCnc Latest Ref Range: 0 - 34 IU/mL  9  Thyroglobulin Antibody Latest Ref Range: 0.0 - 0.9 IU/mL  <1.0    Uptake and Scan from 10/29/20 CLINICAL DATA:  Hyperthyroidism   EXAM: THYROID SCAN AND UPTAKE - 4 AND 24 HOURS   TECHNIQUE: Following oral administration of I-123 capsule, anterior planar imaging was acquired at 24 hours. Thyroid uptake was calculated with a thyroid probe at 4-6 hours and 24 hours.   RADIOPHARMACEUTICALS:  431 uCi I-123 sodium iodide p.o.   COMPARISON:  Thyroid ultrasound 10/07/2020    FINDINGS: Homogeneous tracer distribution in both thyroid lobes.   No focal areas of increased or decreased tracer localization.   4 hour I-123 uptake = 21.7% (normal 5-20%)   24 hour I-123 uptake = 44.3% (normal 10-30%)   IMPRESSION: Normal thyroid scan with elevated 4 hour and 24 hour radio iodine uptakes.   Findings discussed with hyperthyroidism and Graves disease.     Electronically Signed   By: Ulyses Southward M.D.   On: 10/29/2020 17:17  Assessment & Plan:   1. Hyperthyroidism-r/t Graves disease  she is being seen at a kind request of Anabel Halon, MD.  Her uptake and scan shows 4-hr uptake of 21.7% and 24-hr uptake of 44.3%, consistent with Graves disease.  I discussed and ordered RAI ablation of the thyroid.  Will recheck TFTs 6 weeks after ablation done to assess response to treatment and monitor for correct timing of initiation of thyroid hormone supplement.  She was made aware that she may experience a temporary worsening of her symptoms immediately following the ablation, and she is to call us if she experiences those as we can prescribe a short course of oral steroids if needed.    -Patient is advised to maintain close follow up with Anabel Halon, MD for primary care needs.    I spent 30 minutes in the care of the patient today including review of labs from CMP, Lipids, Thyroid Function, Hematology (current and previous including abstractions from other facilities); face-to-face time discussing  her blood glucose readings/logs, discussing hypoglycemia and hyperglycemia episodes and symptoms, medications doses, her options of short and long term treatment based on the latest standards of care / guidelines;  discussion about incorporating lifestyle medicine;  and documenting the encounter.    Please refer to Patient Instructions for Blood Glucose Monitoring and Insulin/Medications Dosing Guide"  in media tab for additional information. Please  also refer to "  Patient Self Inventory" in the Media  tab for reviewed elements of pertinent patient history.  Kimberly Rocha participated in the discussions, expressed understanding, and voiced agreement with the above plans.  All questions were answered to her satisfaction. she is encouraged to contact clinic should she have any questions or concerns prior to her return visit.  Follow up plan: Return in about 8 weeks (around 01/24/2021) for Thyroid follow up, Previsit labs, RAI ablation.   Thank you for involving me in the care of this pleasant patient, and I will continue to update you with her progress.    Ronny Bacon, [facility] [address] [place], [place] [address] Phone: [phone] Fax: [phone]  11/29/2020, 10:04 AM

## 2020-11-29 NOTE — Patient Instructions (Signed)
Radioiodine (I-131) Therapy for Hyperthyroidism ?Radioiodine (I-131) therapy is a treatment for an overactive thyroid gland (hyperthyroidism). The thyroid is a gland in the neck that uses iodine to help control how the body uses food (metabolism). This treatment involves swallowing a pill or liquid that contains I-131. I-131 is manufactured (synthetic) iodine that gives off radiation. After it is swallowed, the I-131 will be absorbed by the thyroid gland over the next few months. It will destroy thyroid cells and reverse hyperthyroidism. ?Tell a health care provider about: ?Any allergies you have. ?All medicines you are taking, including vitamins, herbs, eye drops, creams, and over-the-counter medicines. ?Any blood disorders you have. ?Any surgeries you have had. ?Any medical conditions you have. ?Whether you are pregnant, may be pregnant, or have gone through menopause, if this applies. ?Whether you currently have children. ?Whether you are breastfeeding. ?Whether you plan to have children in the next 2 years. ?Any contact you have with children or pregnant women. ?Your travel plans for the next 3 months. ?Whether you pass through radiation detectors for work or travel. ?What are the risks? ?Generally, this is a safe procedure. However, problems may occur, including: ?Damage to other structures or organs, such as the salivary glands. This could lead to dry mouth and loss of taste. ?Low sperm count, if this applies. This may lead to temporary infertility. ?Sore throat or neck pain. This is temporary. ?Slightly increased risk of thyroid cancer. ?Nausea or vomiting. ?What happens before the procedure? ?Staying hydrated ?Follow instructions from your health care provider about hydration, which may include: ?Up to 2 hours before the procedure - you may continue to drink clear liquids, such as water, clear fruit juice, black coffee, and plain tea. ?Eating and drinking restrictions ?Follow instructions from your health  care provider about eating and drinking restrictions. ?Follow a low-iodine diet as told by your health care provider. Check ingredients on packaged foods and drinks because there are foods that you will need to avoid while on the low-iodine diet: ?Avoid iodized table salt and foods that have iodized salt. ?Avoid seafood, seaweed, soybeans, and soy products. ?Avoid dairy products and eggs. ?Avoid the food dye Red No. 3 because it has iodine. ?Medicines ? ?Ask your health care provider about: ?Changing or stopping your regular medicines. This is especially important if you are taking diabetes medicines, blood thinners, or thyroid medicines. ?Taking over-the-counter medicines, vitamins, herbs, and supplements. ?General instructions ?Women may be asked to take a pregnancy test. ?Women who are breastfeeding should: ?Plan to stop at least 6 weeks before the procedure. ?Not go back to breastfeeding after the procedure until their health care provider approves. ?Plan to avoid contact with other people for 1 week after your treatment. Avoiding contact with children and pregnant women is especially important. To do this, plan to stay home from work, arrange child care, and sleep alone, if these things apply to you. ?Plan to drive yourself home after treatment. Do not take public transportation. If you need someone to drive you home, sit as far away from the driver as possible. ?What happens during the procedure? ?You will be given a dose of I-131 to swallow. It may be a pill or a liquid. ?Your thyroid gland will absorb the I-131 over the next 3 months. The treatment process will be complete in about 6 months. ?What happens after the procedure? ?You may need to stay in the hospital for 24 hours after your treatment. This depends on the requirements in your state. ?Follow instructions   from your health care provider about: ?How to take care of yourself after the procedure. ?How to protect others from exposure to radiation as it  leaves your body. ?Summary ?Radioiodine (I-131) therapy is a treatment for an overactive thyroid gland (hyperthyroidism). ?This treatment involves swallowing a pill or liquid that contains I-131. I-131 is manufactured iodine that gives off radiation. ?Your thyroid gland will absorb the I-131 over the next 3 months. The I-131 destroys thyroid cells and reverses hyperthyroidism. ?Follow instructions from your health care provider about how to take care of yourself and how to protect other people from exposure to radiation after the procedure. ?This information is not intended to replace advice given to you by your health care provider. Make sure you discuss any questions you have with your health care provider. ?Document Revised: 02/24/2018 Document Reviewed: 02/24/2018 ?Elsevier Patient Education ? 2022 [employer] ? ?

## 2020-11-29 NOTE — Assessment & Plan Note (Signed)
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

## 2020-11-29 NOTE — Assessment & Plan Note (Signed)
Continue to use wrist brace for now Will refer to hand surgeon after she gets done with RA ambulation for her Graves' disease

## 2020-11-29 NOTE — Patient Instructions (Signed)
Please start taking Lexapro 20 mg once daily.  Please take Doxycycline as prescribed. Continue using Clindamycin gel after completing Doxycyline.

## 2020-11-29 NOTE — Progress Notes (Signed)
Established Patient Office Visit  Subjective:  Patient ID: Kimberly Rocha, female    DOB: 1976/01/25  Age: 45 y.o. MRN: [id number]  CC:  Chief Complaint  Patient presents with   Follow-up    HPI Kimberly Rocha is a 45 y.o. female with past medical history of PE (11/2019), hyperthyroidism, seizure disorder, ?herpes simplex and anxiety with depression who presents for follow up of her chronic medical conditions.  She complains of anhedonia, anxiety and insomnia despite taking Lexapro, Remeron and as needed Vistaril.  She takes Vistaril only once or twice daily on as-needed basis.  She has episodes of crying spells and has had suicidal ideation in the last few days, but denies any suicidal plan currently.  She was referred to [facility] therapist, but could not see any psychiatrist.  She is going to see a different psychiatry provider.  She is going to get radioactive ablation of her thyroid for Graves' disease.  She has chronic weight loss, tremors and severe anxiety.  She also reports having bumps around her groin/perineal area, for which she went to OB/GYN and was given clindamycin gel for hidradenitis suppurativa, which has not helped much.  Denies any dysuria, hematuria, or vaginal discharge.  She complains of numbness and tingling of bilateral hands.  She has tried using wrist brace.  She also complains of darkening of skin and tingling around the nailbed of the fingers.  She has tried using tweezers to dig into the skin to remove small clusters/lumps.  She has intermittent pain around the nailbed area as well.  She is advised to avoid peeling of the skin near the nailbed as it can lead to paronychia, she does not agree with that.   Past Medical History:  Diagnosis Date   Borderline high cholesterol    Borderline hypertension    Gallstones    Thyroid disease     Past Surgical History:  Procedure Laterality Date   CESAREAN SECTION     CHOLECYSTECTOMY N/A 08/22/2012   Procedure:  LAPAROSCOPIC CHOLECYSTECTOMY;  Surgeon: Jamesetta So, MD;  Location: [facility];  Service: General;  Laterality: N/A;   GALLBLADDER SURGERY     HERNIA REPAIR     TUBAL LIGATION      Family History  Problem Relation Age of Onset   COPD Father    COPD Mother    HIV/AIDS Brother    Other Daughter        has issues with cervix    Social History   Socioeconomic History   Marital status: Married    Spouse name: Not on file   Number of children: Not on file   Years of education: Not on file   Highest education level: Not on file  Occupational History   Not on file  Tobacco Use   Smoking status: Former    Packs/day: 0.50    Types: Cigarettes   Smokeless tobacco: Never  Vaping Use   Vaping Use: Former  Substance and Sexual Activity   Alcohol use: Yes    Comment: occ   Drug use: Yes    Frequency: 3.0 times per week    Types: Marijuana    Comment: twice a day   Sexual activity: Yes    Birth control/protection: Surgical    Comment: tubal  Other Topics Concern   Not on file  Social History Narrative   Not on file   Social Determinants of Health   Financial Resource Strain: Medium Risk   Difficulty of  Paying Living Expenses: Somewhat hard  Food Insecurity: Food Insecurity Present   Worried About Running Out of Food in the Last Year: Sometimes true   Ran Out of Food in the Last Year: Sometimes true  Transportation Needs: No Transportation Needs   Lack of Transportation (Medical): No   Lack of Transportation (Non-Medical): No  Physical Activity: Insufficiently Active   Days of Exercise per Week: 2 days   Minutes of Exercise per Session: 20 min  Stress: Stress Concern Present   Feeling of Stress : Very much  Social Connections: Moderately Isolated   Frequency of Communication with Friends and Family: More than three times a week   Frequency of Social Gatherings with Friends and Family: Twice a week   Attends Religious Services: More than 4 times per year   Active Member  of [employer] or Organizations: No   Attends Archivist Meetings: Never   Marital Status: Never married  Human resources officer Violence: Not At Risk   Fear of Current or Ex-Partner: No   Emotionally Abused: No   Physically Abused: No   Sexually Abused: No    Outpatient Medications Prior to Visit  Medication Sig Dispense Refill   apixaban (ELIQUIS) 5 MG TABS tablet Take 5 mg by mouth daily.     cetirizine (ZYRTEC) 10 MG tablet Take 1 tablet (10 mg total) by mouth daily. 30 tablet 0   clindamycin (CLINDAGEL) 1 % gel Apply to affected area 2 times daily 30 g 1   cyclobenzaprine (FLEXERIL) 10 MG tablet Take 1 tablet (10 mg total) by mouth 3 (three) times daily as needed for muscle spasms. 30 tablet 0   fluconazole (DIFLUCAN) 150 MG tablet Take 1 now and 1 in 3 days 2 tablet 1   hydrOXYzine (VISTARIL) 50 MG capsule Take 1 capsule (50 mg total) by mouth 3 (three) times daily as needed. 90 capsule 2   methimazole (TAPAZOLE) 5 MG tablet Take 1 tablet (5 mg total) by mouth daily. 30 tablet 2   methylPREDNISolone (MEDROL DOSEPAK) 4 MG TBPK tablet Take as package instructions. 1 each 0   mirtazapine (REMERON) 45 MG tablet Take 1 tablet (45 mg total) by mouth at bedtime. 30 tablet 3   nystatin (MYCOSTATIN) 100000 UNIT/ML suspension Take 5 mLs (500,000 Units total) by mouth 4 (four) times daily. 60 mL 0   rosuvastatin (CRESTOR) 10 MG tablet TAKE 1 TABLET BY MOUTH DAILY 90 tablet 0   topiramate (TOPAMAX) 25 MG tablet Take 25 mg by mouth 2 (two) times daily.     triamcinolone cream (KENALOG) 0.1 % Apply 1 application topically 2 (two) times daily. 30 g 0   valACYclovir (VALTREX) 1000 MG tablet Take 1 tablet (1,000 mg total) by mouth 2 (two) times daily. 20 tablet 1   escitalopram (LEXAPRO) 10 MG tablet Take 1 tablet (10 mg total) by mouth daily. 30 tablet 2   No facility-administered medications prior to visit.    Allergies  Allergen Reactions   Demerol Hives and Itching    ROS Review of  Systems  Constitutional:  Negative for chills and fever.  HENT:  Negative for congestion, sinus pressure, sinus pain and sore throat.   Eyes:  Negative for pain and discharge.  Respiratory:  Negative for cough and shortness of breath.   Cardiovascular:  Negative for chest pain and palpitations.  Gastrointestinal:  Negative for abdominal pain, diarrhea, nausea and vomiting.  Endocrine: Positive for heat intolerance. Negative for polydipsia and polyuria.  Genitourinary:  Negative  for dysuria and hematuria.  Musculoskeletal:  Negative for neck pain and neck stiffness.  Skin:  Positive for rash.  Neurological:  Positive for numbness. Negative for dizziness and weakness.  Psychiatric/Behavioral:  Positive for agitation, dysphoric mood and sleep disturbance. Negative for behavioral problems. The patient is nervous/anxious.      Objective:    Physical Exam Vitals reviewed.  Constitutional:      General: She is not in acute distress.    Appearance: She is not diaphoretic.  HENT:     Head: Normocephalic and atraumatic.     Nose: Nose normal.     Mouth/Throat:     Mouth: Mucous membranes are moist.  Eyes:     General: No scleral icterus.    Extraocular Movements: Extraocular movements intact.  Cardiovascular:     Rate and Rhythm: Normal rate and regular rhythm.     Pulses: Normal pulses.     Heart sounds: Normal heart sounds. No murmur heard. Pulmonary:     Breath sounds: Normal breath sounds. No wheezing or rales.  Musculoskeletal:     Cervical back: Neck supple. No tenderness.     Right lower leg: No edema.     Left lower leg: No edema.  Skin:    General: Skin is warm.     Comments: Mild tenderness around nailbed area of b/l fingertips  Neurological:     General: No focal deficit present.     Mental Status: She is alert and oriented to person, place, and time.     Sensory: Sensory deficit (Bilateral hands) present.  Psychiatric:        Mood and Affect: Mood normal.         Behavior: Behavior normal.    LMP 11/16/2020 (Approximate)  Wt Readings from Last 3 Encounters:  11/29/20 116 lb 12.8 oz (53 kg)  10/31/20 114 lb (51.7 kg)  10/21/20 113 lb 12.8 oz (51.6 kg)    Lab Results  Component Value Date   TSH 1.290 11/27/2020   Lab Results  Component Value Date   WBC 16.5 (H) 08/29/2020   HGB 12.3 08/29/2020   HCT 36.1 08/29/2020   MCV 98 (H) 08/29/2020   PLT 209 08/29/2020   Lab Results  Component Value Date   NA 143 07/25/2020   K 4.5 07/25/2020   CO2 23 07/25/2020   GLUCOSE 109 (H) 07/25/2020   BUN 7 07/25/2020   CREATININE 0.83 07/25/2020   BILITOT 0.3 07/25/2020   ALKPHOS 79 07/25/2020   AST 11 07/25/2020   ALT 8 07/25/2020   PROT 7.1 07/25/2020   ALBUMIN 4.5 07/25/2020   CALCIUM 9.6 08/29/2020   ANIONGAP 8 04/21/2020   EGFR 89 07/25/2020   Lab Results  Component Value Date   CHOL 233 (H) 07/25/2020   Lab Results  Component Value Date   HDL 47 07/25/2020   Lab Results  Component Value Date   LDLCALC 175 (H) 07/25/2020   Lab Results  Component Value Date   TRIG 63 07/25/2020   Lab Results  Component Value Date   CHOLHDL 5.0 (H) 07/25/2020   No results found for: HGBA1C    Assessment & Plan:   Problem List Items Addressed This Visit       Endocrine   Hyperthyroidism    Lab Results  Component Value Date   TSH 1.290 11/27/2020  On Methimazole 5 mg QD Checked US thyroid Referred to Endocrinology - planned to get RA ablation for Graves' disease  Nervous and Auditory   Carpal tunnel syndrome of right wrist    Continue to use wrist brace for now Will refer to hand surgeon after she gets done with RA ambulation for her Graves' disease      Relevant Medications   escitalopram (LEXAPRO) 20 MG tablet     Musculoskeletal and Integument   Hidradenitis suppurativa    Not better with clindamycin gel Started doxycycline p.o. If persistent, will refer to general surgery      Relevant Medications    doxycycline (VIBRA-TABS) 100 MG tablet     Other   Anxiety with depression - Primary    Uncontrolled with Remeron 45 mg qHS and Vistaril 50 mg TID PRN Had added Lexapro 10 mg QD Referred to psychiatry/BH therapy      Relevant Medications   escitalopram (LEXAPRO) 20 MG tablet   Suicidal ideation    Severe depression with anxiety with suicidal ideation, but no plan Refer to psychiatry Suicide prevention material provided in the MyChart       Meds ordered this encounter  Medications   doxycycline (VIBRA-TABS) 100 MG tablet    Sig: Take 1 tablet (100 mg total) by mouth 2 (two) times daily.    Dispense:  20 tablet    Refill:  0   escitalopram (LEXAPRO) 20 MG tablet    Sig: Take 1 tablet (20 mg total) by mouth daily.    Dispense:  30 tablet    Refill:  2    Follow-up: Return in about 4 months (around 03/29/2021) for Depression and anxiety.    Lindell Spar, MD

## 2020-11-29 NOTE — Assessment & Plan Note (Signed)
Lab Results  Component Value Date   TSH 1.290 11/27/2020   On Methimazole 5 mg QD Checked US thyroid Referred to Endocrinology - planned to get RA ablation for Graves' disease

## 2020-12-04 ENCOUNTER — Ambulatory Visit: Payer: Self-pay | Admitting: Adult Health

## 2020-12-04 ENCOUNTER — Telehealth: Payer: Self-pay | Admitting: Internal Medicine

## 2020-12-04 ENCOUNTER — Encounter: Payer: Self-pay | Admitting: Adult Health

## 2020-12-04 ENCOUNTER — Other Ambulatory Visit: Payer: Self-pay

## 2020-12-04 VITALS — BP 119/83 | HR 88 | Ht 60.0 in | Wt 118.0 lb

## 2020-12-04 DIAGNOSIS — R238 Other skin changes: Secondary | ICD-10-CM

## 2020-12-04 DIAGNOSIS — R319 Hematuria, unspecified: Secondary | ICD-10-CM

## 2020-12-04 DIAGNOSIS — N898 Other specified noninflammatory disorders of vagina: Secondary | ICD-10-CM

## 2020-12-04 DIAGNOSIS — L732 Hidradenitis suppurativa: Secondary | ICD-10-CM

## 2020-12-04 LAB — POCT URINALYSIS DIPSTICK
Glucose, UA: NEGATIVE
Ketones, UA: NEGATIVE
Leukocytes, UA: NEGATIVE
Nitrite, UA: NEGATIVE
Protein, UA: NEGATIVE

## 2020-12-04 MED ORDER — DOXYCYCLINE HYCLATE 100 MG PO TABS: 100.0000 mg | ORAL_TABLET | Freq: Two times a day (BID) | ORAL | 0 refills | Status: DC

## 2020-12-04 MED ORDER — VALACYCLOVIR HCL 1 G PO TABS: 1000.0000 mg | ORAL_TABLET | Freq: Two times a day (BID) | ORAL | 1 refills | Status: DC

## 2020-12-04 NOTE — Telephone Encounter (Signed)
Please send to [facility] she handles referrals

## 2020-12-04 NOTE — Telephone Encounter (Signed)
April with [facility]  Called in on pt behalf in regards to pt ref. [facility] needs last note demographics as well as Furniture conservator/restorer. Stated the age range is 9-65    April [facility]  Call back # [phone] Fax # [phone]

## 2020-12-04 NOTE — Progress Notes (Signed)
  Subjective:     Patient ID: Kimberly Rocha, female   DOB: 08-24-1975, 45 y.o.   MRN: [id number]  HPI Kimberly Rocha is a 45 year old black female, engaged, G5 P4106, in complaining of blisters in vaginal area and itching, has noticed about 3-4 days. PCP is Dr Allena Katz Lab Results  Component Value Date   DIAGPAP  09/09/2020    - Negative for intraepithelial lesion or malignancy (NILM)   HPVHIGH Negative 09/09/2020     Review of Systems Blisters in vaginal area Itching in vaginal area Reviewed past medical,surgical, social and family history. Reviewed medications and allergies.     Objective:   Physical Exam BP 119/83 (BP Location: Left Arm, Patient Position: Sitting, Cuff Size: Normal)   Pulse 88   Ht 5' (1.524 m)   Wt 118 lb (53.5 kg)   LMP 12/01/2020 (Approximate)   BMI 23.05 kg/m  urine dipstick 2+blood, on period. Skin warm and dry.Pelvic: external genitalia is normal in appearance,has old scarring, vagina: period blood, has broken vesicles vs ulcer, right introitus and left introitus, but less on left, HSV culture obtained,urethra has no lesions or masses noted, cervix:smooth and bulbous, uterus: normal size, shape and contour, non tender, no masses felt, adnexa: no masses or tenderness noted. Bladder is non tender and no masses felt.   Fall risk is low  Upstream - 12/04/20 0947       Pregnancy Intention Screening   Does the patient want to become pregnant in the next year? No    Does the patient's partner want to become pregnant in the next year? No    Would the patient like to discuss contraceptive options today? No      Contraception Wrap Up   Current Method Female Sterilization    End Method Female Sterilization    Contraception Counseling Provided No            Examination chaperoned by Malachy Mood LPN.  Assessment:      1. Hematuria, unspecified type   2. Vesicles HSV culture obtained Will rx valtrex and doxycycline   Meds ordered this encounter  Medications    valACYclovir (VALTREX) 1000 MG tablet    Sig: Take 1 tablet (1,000 mg total) by mouth 2 (two) times daily.    Dispense:  20 tablet    Refill:  1    Order Specific Question:   Supervising Provider    Answer:   Duane Lope H [[id number]]   doxycycline (VIBRA-TABS) 100 MG tablet    Sig: Take 1 tablet (100 mg total) by mouth 2 (two) times daily.    Dispense:  20 tablet    Refill:  0    Order Specific Question:   Supervising Provider    Answer:   Despina Hidden, LUTHER H [[id number]]     3. Itching in the vaginal area  4. Hidradenitis suppurativa    Plan:     Follow up in 2 weeks for recheck

## 2020-12-06 LAB — HERPES SIMPLEX VIRUS CULTURE

## 2020-12-06 NOTE — Written Directive (Addendum)
MOLECULAR IMAGING AND THERAPEUTICS WRITTEN DIRECTIVE   PATIENT NAME: Kimberly Rocha  PT DOB:   08-01-1975                                              MRN: [id number]  ---------------------------------------------------------------------------------------------------------------------   I-131 WHOLE THYROID THERAPY (NON-CANCER)    RADIOPHARMACEUTICAL:   Iodine-131 Capsule    PRESCRIBED DOSE FOR ADMINISTRATION: 18 mCi  (eighteen millicuries)   ROUTE OFADMINISTRATION: PO   DIAGNOSIS:  Graves Disease   REFERRING PHYSICIAN: Dr. Dede Query   TSH:    Lab Results  Component Value Date   TSH 1.290 11/27/2020   TSH 1.370 11/27/2020   TSH 0.320 (L) 08/29/2020     PRIOR I-131 THERAPY (Date and Dose):   PRIOR RADIOLOGY EXAMS (Results and Date): NM THYROID MULT UPTAKE W/IMAGING  Result Date: 10/29/2020 CLINICAL DATA:  Hyperthyroidism EXAM: THYROID SCAN AND UPTAKE - 4 AND 24 HOURS TECHNIQUE: Following oral administration of I-123 capsule, anterior planar imaging was acquired at 24 hours. Thyroid uptake was calculated with a thyroid probe at 4-6 hours and 24 hours. RADIOPHARMACEUTICALS:  431 uCi I-123 sodium iodide p.o. COMPARISON:  Thyroid ultrasound 10/07/2020 FINDINGS: Homogeneous tracer distribution in both thyroid lobes. No focal areas of increased or decreased tracer localization. 4 hour I-123 uptake = 21.7% (normal 5-20%) 24 hour I-123 uptake = 44.3% (normal 10-30%) IMPRESSION: Normal thyroid scan with elevated 4 hour and 24 hour radio iodine uptakes. Findings discussed with hyperthyroidism and Graves disease. Electronically Signed   By: Ulyses Southward M.D.   On: 10/29/2020 17:17   US THYROID  Result Date: 10/07/2020 CLINICAL DATA:  Hyperthyroid. EXAM: THYROID ULTRASOUND TECHNIQUE: Ultrasound examination of the thyroid gland and adjacent soft tissues was performed. COMPARISON:  CTA chest, 04/21/2020. FINDINGS: Parenchymal Echotexture: Normal Isthmus: 0.2 cm Right lobe:  5.8 x 1.6 x 1.9 cm Left lobe: 4.9 x 1.1 x 1.7 cm _________________________________________________________ Estimated total number of nodules >/= 1 cm: 0 Number of spongiform nodules >/=  2 cm not described below (TR1): 0 Number of mixed cystic and solid nodules >/= 1.5 cm not described below (TR2): 0 _________________________________________________________ Nodule # 1: Location: Left; Inferior Maximum size: 0.5 cm; Other 2 dimensions: 0.4 x 0.2 cm Composition: cystic/almost completely cystic (0) Echogenicity: anechoic (0) Shape: not taller-than-wide (0) Margins: smooth (0) Echogenic foci: macrocalcifications (1) ACR TI-RADS total points: 1. ACR TI-RADS risk category: TR1 (0-1 points). ACR TI-RADS recommendations: This nodule does NOT meet TI-RADS criteria for biopsy or dedicated follow-up. _________________________________________________________ IMPRESSION: 1. Normal, normal enlarged appearance of thyroid. 2. Cystic, subcentimeter LEFT thyroid nodule without additional follow-up or investigation recommended per current guidelines. The above is in keeping with the ACR TI-RADS recommendations - J Am Coll Radiol 2017;14:587-595. Roanna Banning, MD [facility] Electronically Signed   By: Roanna Banning M.D.   On: 10/07/2020 16:35      ADDITIONAL PHYSICIAN COMMENTS/NOTES   AUTHORIZED USER SIGNATURE & TIME STAMP:

## 2020-12-13 ENCOUNTER — Encounter (HOSPITAL_COMMUNITY)
Admission: RE | Admit: 2020-12-13 | Discharge: 2020-12-13 | Disposition: A | Discharge status: Home / Self Care | Payer: Self-pay | Source: Ambulatory Visit | Attending: Nurse Practitioner | Admitting: Nurse Practitioner

## 2020-12-13 ENCOUNTER — Other Ambulatory Visit: Payer: Self-pay

## 2020-12-13 DIAGNOSIS — E0500 Thyrotoxicosis with diffuse goiter without thyrotoxic crisis or storm: Secondary | ICD-10-CM | POA: Insufficient documentation

## 2020-12-13 DIAGNOSIS — E0590 Thyrotoxicosis, unspecified without thyrotoxic crisis or storm: Secondary | ICD-10-CM | POA: Insufficient documentation

## 2020-12-13 IMAGING — NM NM RAI THERAPY FOR HYPERTHYROIDISM
1 series · 1 of 1 positions shown · non-contrast
Comparison: Thyroid uptake and scan [DATE]

CLINICAL DATA: Hyperthyroidism

EXAM:
RADIOACTIVE IODINE THERAPY FOR HYPERTHYROIDISM
TECHNIQUE: Radioactive iodine prescribed by Dr. LISAINETH. The risks and benefits
of radioactive iodine therapy were discussed with the patient in
detail by Dr. LISAINETH. Alternative therapies were also mentioned.
Radiation safety was discussed with the patient, including how to
protect the general public from exposure. There were no barriers to
communication. Written consent was obtained. The patient then
received a capsule containing the radiopharmaceutical.
The patient will follow-up with the referring physician.
RADIOPHARMACEUTICALS:  18.1 mCi [PB] sodium iodide orally

[Series 1: bone statics · 2.07mm/px · 1 of 1 slices shown]
[im 1/1]
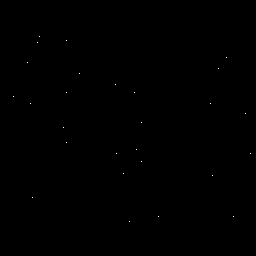

[1 of 1 positions shown; findings below may reference images not displayed]

IMPRESSION: Per oral administration of [PB] sodium iodide for the treatment of
hyperthyroidism.

## 2020-12-13 MED ORDER — TECHNETIUM TC 99M MEBROFENIN IV KIT: 5.0000 | PACK | Freq: Once | INTRAVENOUS | Status: DC | PRN

## 2020-12-13 MED ORDER — SODIUM IODIDE I 131 CAPSULE
18.1000 | Freq: Once | INTRAVENOUS | Status: AC | PRN
  Administered 2020-12-13: 18.1 via ORAL

## 2020-12-16 ENCOUNTER — Encounter: Payer: Self-pay | Admitting: Nurse Practitioner

## 2020-12-18 ENCOUNTER — Other Ambulatory Visit: Payer: Self-pay | Admitting: Adult Health

## 2020-12-18 ENCOUNTER — Ambulatory Visit: Payer: Medicaid Other | Admitting: Adult Health

## 2020-12-24 ENCOUNTER — Encounter: Payer: Self-pay | Admitting: Nurse Practitioner

## 2020-12-25 ENCOUNTER — Other Ambulatory Visit: Payer: Self-pay | Admitting: Internal Medicine

## 2020-12-25 DIAGNOSIS — B370 Candidal stomatitis: Secondary | ICD-10-CM

## 2020-12-25 MED ORDER — NYSTATIN 100000 UNIT/ML MT SUSP: 5.0000 mL | Freq: Four times a day (QID) | OROMUCOSAL | 0 refills | Status: DC

## 2020-12-26 ENCOUNTER — Ambulatory Visit: Payer: Medicaid Other | Admitting: Adult Health

## 2020-12-31 ENCOUNTER — Other Ambulatory Visit: Payer: Self-pay | Admitting: Adult Health

## 2020-12-31 DIAGNOSIS — L732 Hidradenitis suppurativa: Secondary | ICD-10-CM

## 2020-12-31 NOTE — Progress Notes (Signed)
Refer to Kimberly Rocha at [facility] dermatology

## 2021-01-06 ENCOUNTER — Other Ambulatory Visit: Payer: Self-pay | Admitting: Internal Medicine

## 2021-01-06 ENCOUNTER — Encounter: Payer: Self-pay | Admitting: Internal Medicine

## 2021-01-06 DIAGNOSIS — B370 Candidal stomatitis: Secondary | ICD-10-CM

## 2021-01-06 MED ORDER — NYSTATIN 100000 UNIT/ML MT SUSP: 5.0000 mL | Freq: Four times a day (QID) | OROMUCOSAL | 0 refills | Status: DC

## 2021-01-17 ENCOUNTER — Other Ambulatory Visit: Payer: Self-pay

## 2021-01-17 ENCOUNTER — Encounter: Payer: Self-pay | Admitting: Nurse Practitioner

## 2021-01-17 ENCOUNTER — Encounter: Payer: Self-pay | Admitting: Internal Medicine

## 2021-01-17 ENCOUNTER — Ambulatory Visit (INDEPENDENT_AMBULATORY_CARE_PROVIDER_SITE_OTHER): Payer: Self-pay | Admitting: Internal Medicine

## 2021-01-17 DIAGNOSIS — R55 Syncope and collapse: Secondary | ICD-10-CM

## 2021-01-17 DIAGNOSIS — R42 Dizziness and giddiness: Secondary | ICD-10-CM

## 2021-01-17 NOTE — Progress Notes (Signed)
Virtual Visit via Telephone Note   This visit type was conducted due to national recommendations for restrictions regarding the COVID-19 Pandemic (e.g. social distancing) in an effort to limit this patient's exposure and mitigate transmission in our community.  Due to her co-morbid illnesses, this patient is at least at moderate risk for complications without adequate follow up.  This format is felt to be most appropriate for this patient at this time.  The patient did not have access to video technology/had technical difficulties with video requiring transitioning to audio format only (telephone).  All issues noted in this document were discussed and addressed.  No physical exam could be performed with this format.  Evaluation Performed:  Follow-up visit  Date:  01/17/2021   ID:  Kimberly Rocha, DOB Sep 18, 1975, MRN [id number]  Patient Location: Home Provider Location: Office/Clinic  Participants: Patient Location of Patient: Home Location of Provider: Telehealth Consent was obtain for visit to be over via telehealth. I verified that I am speaking with the correct person using two identifiers.  PCP:  Anabel Halon, MD   Chief Complaint: Dizziness  History of Present Illness:    Kimberly Rocha is a 45 y.o. female who has a televisit for complaint of dizziness after she had a watery BM this morning.  She denies any further episode of watery BM since then.  Denies any episode of dizziness since then.  She also had mild nausea, hot flashes and sense of passing out at that time.  She denied any fall at that time.  Denies any fever, chills.  Of note, she has had RA ablation of thyroid for hyperthyroidism in 11/2020.  She is not on any thyroid medicine currently.  The patient does not have symptoms concerning for COVID-19 infection (fever, chills, cough, or new shortness of breath).   Past Medical, Surgical, Social History, Allergies, and Medications have been Reviewed.  Past Medical  History:  Diagnosis Date   Borderline high cholesterol    Borderline hypertension    Gallstones    Thyroid disease    Past Surgical History:  Procedure Laterality Date   CESAREAN SECTION     CHOLECYSTECTOMY N/A 08/22/2012   Procedure: LAPAROSCOPIC CHOLECYSTECTOMY;  Surgeon: Dalia Heading, MD;  Location: [facility];  Service: General;  Laterality: N/A;   GALLBLADDER SURGERY     HERNIA REPAIR     TUBAL LIGATION       No outpatient medications have been marked as taking for the 01/17/21 encounter (Office Visit) with Anabel Halon, MD.     Allergies:   Demerol   ROS:   Please see the history of present illness.     All other systems reviewed and are negative.   Labs/Other Tests and Data Reviewed:    Recent Labs: 07/25/2020: ALT 8; BUN 7; Creatinine, Ser 0.83; Potassium 4.5; Sodium 143 08/29/2020: Hemoglobin 12.3; Platelets 209 11/27/2020: TSH 1.290   Recent Lipid Panel Lab Results  Component Value Date/Time   CHOL 233 (H) 07/25/2020 10:29 AM   TRIG 63 07/25/2020 10:29 AM   HDL 47 07/25/2020 10:29 AM   CHOLHDL 5.0 (H) 07/25/2020 10:29 AM   LDLCALC 175 (H) 07/25/2020 10:29 AM    Wt Readings from Last 3 Encounters:  12/04/20 118 lb (53.5 kg)  11/29/20 116 lb (52.6 kg)  11/29/20 116 lb 12.8 oz (53 kg)     ASSESSMENT & PLAN:    Presyncope Likely due to dehydration from diarrhea Advised to stay well-hydrated  by taking at least 64 ounces of fluid, additional if any more episode of diarrhea Advised to eat as tolerated for now Avoid sudden positional changes Needs to have TSH and free T4 testing, followed by Endocrinology  Time:   Today, I have spent 9 minutes reviewing the chart, including problem list, medications, and with the patient with telehealth technology discussing the above problems.   Medication Adjustments/Labs and Tests Ordered: Current medicines are reviewed at length with the patient today.  Concerns regarding medicines are outlined above.   Tests  Ordered: No orders of the defined types were placed in this encounter.   Medication Changes: No orders of the defined types were placed in this encounter.    Note: This dictation was prepared with Dragon dictation along with smaller phrase technology. Similar sounding words can be transcribed inadequately or may not be corrected upon review. Any transcriptional errors that result from this process are unintentional.      Disposition:  Follow up  Signed, Anabel Halon, MD  01/17/2021 11:53 AM     [facility]

## 2021-01-17 NOTE — Telephone Encounter (Signed)
Pt made a phone visit with dr patel 01-17-21

## 2021-01-22 LAB — TSH: TSH: 7.99 u[IU]/mL — ABNORMAL HIGH (ref 0.450–4.500)

## 2021-01-22 LAB — T4, FREE: Free T4: 1.01 ng/dL (ref 0.82–1.77)

## 2021-01-22 LAB — T3, FREE: T3, Free: 2.6 pg/mL (ref 2.0–4.4)

## 2021-01-24 ENCOUNTER — Ambulatory Visit (INDEPENDENT_AMBULATORY_CARE_PROVIDER_SITE_OTHER): Payer: Self-pay | Admitting: Nurse Practitioner

## 2021-01-24 ENCOUNTER — Encounter: Payer: Self-pay | Admitting: Nurse Practitioner

## 2021-01-24 ENCOUNTER — Other Ambulatory Visit: Payer: Self-pay

## 2021-01-24 VITALS — BP 112/74 | HR 85 | Ht 60.0 in | Wt 120.6 lb

## 2021-01-24 DIAGNOSIS — E890 Postprocedural hypothyroidism: Secondary | ICD-10-CM

## 2021-01-24 MED ORDER — LEVOTHYROXINE SODIUM 25 MCG PO TABS: 25.0000 ug | ORAL_TABLET | Freq: Every day | ORAL | 1 refills | Status: DC

## 2021-01-24 NOTE — Progress Notes (Signed)
01/24/2021     Endocrinology Follow Up Note    Subjective:    Patient ID: Kimberly Rocha, female    DOB: May 09, 1975, PCP Anabel Halon, MD.   Past Medical History:  Diagnosis Date   Borderline high cholesterol    Borderline hypertension    Gallstones    Thyroid disease     Past Surgical History:  Procedure Laterality Date   CESAREAN SECTION     CHOLECYSTECTOMY N/A 08/22/2012   Procedure: LAPAROSCOPIC CHOLECYSTECTOMY;  Surgeon: Dalia Heading, MD;  Location: [facility];  Service: General;  Laterality: N/A;   GALLBLADDER SURGERY     HERNIA REPAIR     TUBAL LIGATION      Social History   Socioeconomic History   Marital status: Married    Spouse name: Not on file   Number of children: Not on file   Years of education: Not on file   Highest education level: Not on file  Occupational History   Not on file  Tobacco Use   Smoking status: Former    Packs/day: 0.50    Types: Cigarettes   Smokeless tobacco: Never  Vaping Use   Vaping Use: Former  Substance and Sexual Activity   Alcohol use: Yes    Comment: occ   Drug use: Yes    Frequency: 3.0 times per week    Types: Marijuana    Comment: twice a day   Sexual activity: Yes    Birth control/protection: Surgical    Comment: tubal  Other Topics Concern   Not on file  Social History Narrative   Not on file   Social Determinants of Health   Financial Resource Strain: Medium Risk   Difficulty of Paying Living Expenses: Somewhat hard  Food Insecurity: Food Insecurity Present   Worried About Running Out of Food in the Last Year: Sometimes true   Ran Out of Food in the Last Year: Sometimes true  Transportation Needs: No Transportation Needs   Lack of Transportation (Medical): No   Lack of Transportation (Non-Medical): No  Physical Activity: Insufficiently Active   Days of Exercise per Week: 2 days   Minutes of Exercise per Session: 20 min  Stress: Stress Concern Present   Feeling of Stress : Very much   Social Connections: Moderately Isolated   Frequency of Communication with Friends and Family: More than three times a week   Frequency of Social Gatherings with Friends and Family: Twice a week   Attends Religious Services: More than 4 times per year   Active Member of [employer] or Organizations: No   Attends Banker Meetings: Never   Marital Status: Never married    Family History  Problem Relation Age of Onset   COPD Father    COPD Mother    HIV/AIDS Brother    Other Daughter        has issues with cervix    Outpatient Encounter Medications as of 01/24/2021  Medication Sig   clindamycin (CLINDAGEL) 1 % gel APPLY TO THE AFFECTED AREA TWICE DAILY   escitalopram (LEXAPRO) 20 MG tablet Take 1 tablet (20 mg total) by mouth daily.   hydrOXYzine (VISTARIL) 50 MG capsule Take 1 capsule (50 mg total) by mouth 3 (three) times daily as needed.   levothyroxine (SYNTHROID) 25 MCG tablet Take 1 tablet (25 mcg total) by mouth daily.   mirtazapine (REMERON) 45 MG tablet Take 1 tablet (45 mg total) by mouth at bedtime.   nystatin (  MYCOSTATIN) 100000 UNIT/ML suspension Take 5 mLs (500,000 Units total) by mouth 4 (four) times daily.   rosuvastatin (CRESTOR) 10 MG tablet TAKE 1 TABLET BY MOUTH DAILY (Patient not taking: Reported on 01/17/2021)   triamcinolone cream (KENALOG) 0.1 % Apply 1 application topically 2 (two) times daily. (Patient not taking: Reported on 01/17/2021)   valACYclovir (VALTREX) 1000 MG tablet Take 1 tablet (1,000 mg total) by mouth 2 (two) times daily. (Patient not taking: Reported on 01/17/2021)   No facility-administered encounter medications on file as of 01/24/2021.    ALLERGIES: Allergies  Allergen Reactions   Demerol Hives and Itching    VACCINATION STATUS: Immunization History  Administered Date(s) Administered   Moderna Sars-Covid-2 Vaccination 02/27/2020, 03/27/2020     HPI  Kimberly Rocha is 45 y.o. female who presents today with a medical  history as above. she is being seen in follow up after being seen in consultation for hyperthyroidism requested by Anabel Halon, MD.  she has been dealing with symptoms of unintentional weight loss, tremors, fatigue, shortness of breath with exertion, hair loss, anxiety, rash, and tingling in her fingers for approximately 2 months. These symptoms are progressively worsening and troubling to her.  her most recent thyroid labs revealed mildly suppressed TSH of  0.326 on 07/25/20 and again on 08/29/20 therefore she was started on Methimazole 5 mg po daily.  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  she denies dysphagia, choking, no recent voice change.    she denies family history of thyroid dysfunction and denies family hx of thyroid cancer. she denies personal history of goiter. she is not on any thyroid hormone supplements, has been taking antithyroid medication for approximately 30 days. Denies use of Biotin containing supplements.  she is willing to proceed with appropriate work up and therapy for thyrotoxicosis.   Review of systems  Constitutional: + Minimally fluctuating body weight,  current Body mass index is 23.55 kg/m. , no fatigue, no subjective hyperthermia, no subjective hypothermia Eyes: no blurry vision, no xerophthalmia ENT: no sore throat, no nodules palpated in throat, no dysphagia/odynophagia, no hoarseness, thrush has resolved Cardiovascular: no chest pain, no shortness of breath, no palpitations, no leg swelling Respiratory: no cough, no shortness of breath Gastrointestinal: no nausea/vomiting/diarrhea Musculoskeletal: no muscle/joint aches Skin: no rashes, no hyperemia, + hair loss, intermittent tingling - has dermatologist referral coming up Neurological: no tremors, no numbness, no tingling, no dizziness Psychiatric: no depression, no anxiety   Objective:    BP 112/74    Pulse 85    Ht 5' (1.524 m)    Wt 120 lb 9.6 oz (54.7 kg)    SpO2 99%     BMI 23.55 kg/m   Wt Readings from Last 3 Encounters:  01/24/21 120 lb 9.6 oz (54.7 kg)  12/04/20 118 lb (53.5 kg)  11/29/20 116 lb (52.6 kg)     BP Readings from Last 3 Encounters:  01/24/21 112/74  12/04/20 119/83  11/29/20 126/79                        Physical Exam- Limited  Constitutional:  Body mass index is 23.55 kg/m. , not in acute distress, frustrated and tearful today Eyes:  EOMI, no exophthalmos Neck: Supple Cardiovascular: RRR, no murmurs, rubs, or gallops, no edema Respiratory: Adequate breathing efforts, no crackles, rales, rhonchi, or wheezing Musculoskeletal: no gross deformities, strength intact in all four extremities, no gross restriction of joint  movements Skin:  no rashes, no hyperemia Neurological: no tremor with outstretched hands   CMP     Component Value Date/Time   NA 143 07/25/2020 1029   K 4.5 07/25/2020 1029   CL 98 07/25/2020 1029   CO2 23 07/25/2020 1029   GLUCOSE 109 (H) 07/25/2020 1029   GLUCOSE 104 (H) 04/21/2020 1055   BUN 7 07/25/2020 1029   CREATININE 0.83 07/25/2020 1029   CALCIUM 9.6 08/29/2020 1000   PROT 7.1 07/25/2020 1029   ALBUMIN 4.5 07/25/2020 1029   AST 11 07/25/2020 1029   ALT 8 07/25/2020 1029   ALKPHOS 79 07/25/2020 1029   BILITOT 0.3 07/25/2020 1029   GFRNONAA >60 04/21/2020 1055   GFRAA >60 10/17/2018 1545     CBC    Component Value Date/Time   WBC 16.5 (H) 08/29/2020 1000   WBC 8.8 04/21/2020 1055   RBC 3.69 (L) 08/29/2020 1000   RBC 4.01 04/21/2020 1055   HGB 12.3 08/29/2020 1000   HCT 36.1 08/29/2020 1000   PLT 209 08/29/2020 1000   MCV 98 (H) 08/29/2020 1000   MCH 33.3 (H) 08/29/2020 1000   MCH 33.9 04/21/2020 1055   MCHC 34.1 08/29/2020 1000   MCHC 35.1 04/21/2020 1055   RDW 13.1 08/29/2020 1000   LYMPHSABS 3.0 08/29/2020 1000   MONOABS 0.6 04/21/2020 1055   EOSABS 0.0 08/29/2020 1000   BASOSABS 0.0 08/29/2020 1000     Diabetic Labs (most recent): No results found for: HGBA1C  Lipid  Panel     Component Value Date/Time   CHOL 233 (H) 07/25/2020 1029   TRIG 63 07/25/2020 1029   HDL 47 07/25/2020 1029   CHOLHDL 5.0 (H) 07/25/2020 1029   LDLCALC 175 (H) 07/25/2020 1029   LABVLDL 11 07/25/2020 1029     Lab Results  Component Value Date   TSH 7.990 (H) 01/21/2021   TSH 1.290 11/27/2020   TSH 1.370 11/27/2020   TSH 0.320 (L) 08/29/2020   TSH 0.326 (L) 07/25/2020   FREET4 1.01 01/21/2021   FREET4 0.98 11/27/2020   FREET4 0.94 11/27/2020   FREET4 1.06 08/29/2020    Results for Vanvranken, Kimberly A "TOYE" (MRN [id number]) as of 11/29/2020 09:49  Ref. Range 11/27/2020 13:14 11/27/2020 13:15  TSH Latest Ref Range: 0.450 - 4.500 uIU/mL 1.370 1.290  Triiodothyronine,Free,Serum Latest Ref Range: 2.0 - 4.4 pg/mL 2.8 2.9  T4,Free(Direct) Latest Ref Range: 0.82 - 1.77 ng/dL 6.30 1.60  Thyroperoxidase Ab SerPl-aCnc Latest Ref Range: 0 - 34 IU/mL  9  Thyroglobulin Antibody Latest Ref Range: 0.0 - 0.9 IU/mL  <1.0    Uptake and Scan from 10/29/20 CLINICAL DATA:  Hyperthyroidism   EXAM: THYROID SCAN AND UPTAKE - 4 AND 24 HOURS   TECHNIQUE: Following oral administration of I-123 capsule, anterior planar imaging was acquired at 24 hours. Thyroid uptake was calculated with a thyroid probe at 4-6 hours and 24 hours.   RADIOPHARMACEUTICALS:  431 uCi I-123 sodium iodide p.o.   COMPARISON:  Thyroid ultrasound 10/07/2020   FINDINGS: Homogeneous tracer distribution in both thyroid lobes.   No focal areas of increased or decreased tracer localization.   4 hour I-123 uptake = 21.7% (normal 5-20%)   24 hour I-123 uptake = 44.3% (normal 10-30%)   IMPRESSION: Normal thyroid scan with elevated 4 hour and 24 hour radio iodine uptakes.   Findings discussed with hyperthyroidism and Graves disease.     Electronically Signed   By: Ulyses Southward M.D.   On: 10/29/2020  17:17   Latest Reference Range & Units 11/27/20 13:15 01/21/21 14:04  TSH 0.450 - 4.500 uIU/mL 1.290 7.990 (H)   Triiodothyronine,Free,Serum 2.0 - 4.4 pg/mL 2.9 2.6  T4,Free(Direct) 0.82 - 1.77 ng/dL 7.67 3.41  Thyroperoxidase Ab SerPl-aCnc 0 - 34 IU/mL 9   Thyroglobulin Antibody 0.0 - 0.9 IU/mL <1.0   (H): Data is abnormally high  Assessment & Plan:   1. Hypothyroidism s/p RAI ablation for Graves Disease  she is being seen at a kind request of Anabel Halon, MD.  Her uptake and scan shows 4-hr uptake of 21.7% and 24-hr uptake of 44.3%, consistent with Graves disease.  She had RAI ablation on 12/13/20.  Her repeat thyroid function tests are now consistent with hypothyroidism, indicating successful ablation therapy.  She is now ready to start thyroid hormone replacement.  I discussed and initiated Levothyroxine 25 mcg po daily before breakfast.   - The correct intake of thyroid hormone (Levothyroxine, Synthroid), is on empty stomach first thing in the morning, with water, separated by at least 30 minutes from breakfast and other medications,  and separated by more than 4 hours from calcium, iron, multivitamins, acid reflux medications (PPIs).  - This medication is a life-long medication and will be needed to correct thyroid hormone imbalances for the rest of your life.  The dose may change from time to time, based on thyroid blood work.  - It is extremely important to be consistent taking this medication, near the same time each morning.  -AVOID TAKING PRODUCTS CONTAINING BIOTIN (commonly found in Hair, Skin, Nails vitamins) AS IT INTERFERES WITH THE VALIDITY OF THYROID FUNCTION BLOOD TESTS.    -Patient is advised to maintain close follow up with Anabel Halon, MD for primary care needs.    I spent 30 minutes in the care of the patient today including review of labs from Thyroid Function, CMP, and other relevant labs ; imaging/biopsy records (current and previous including abstractions from other facilities); face-to-face time discussing  her lab results and symptoms, medications doses, her  options of short and long term treatment based on the latest standards of care / guidelines;   and documenting the encounter.  Kimberly Rocha  participated in the discussions, expressed understanding, and voiced agreement with the above plans.  All questions were answered to her satisfaction. she is encouraged to contact clinic should she have any questions or concerns prior to her return visit.  Follow up plan: Return in about 2 months (around 03/25/2021) for Thyroid follow up, Previsit labs.   Thank you for involving me in the care of this pleasant patient, and I will continue to update you with her progress.    Ronny Bacon, [facility] [address] [place], [place] [address] Phone: [phone] Fax: [phone]  01/24/2021, 11:30 AM

## 2021-01-24 NOTE — Patient Instructions (Signed)

## 2021-01-29 ENCOUNTER — Other Ambulatory Visit: Payer: Self-pay | Admitting: Internal Medicine

## 2021-01-29 DIAGNOSIS — F418 Other specified anxiety disorders: Secondary | ICD-10-CM

## 2021-01-31 ENCOUNTER — Other Ambulatory Visit: Payer: Self-pay | Admitting: Internal Medicine

## 2021-01-31 DIAGNOSIS — F418 Other specified anxiety disorders: Secondary | ICD-10-CM

## 2021-02-14 ENCOUNTER — Other Ambulatory Visit: Payer: Self-pay

## 2021-02-14 ENCOUNTER — Encounter: Payer: Self-pay | Admitting: Internal Medicine

## 2021-02-14 DIAGNOSIS — F418 Other specified anxiety disorders: Secondary | ICD-10-CM

## 2021-02-14 NOTE — Telephone Encounter (Signed)
[facility] referral [facility] to [facility] per patient request and front desk scheduling in office appt

## 2021-02-21 ENCOUNTER — Ambulatory Visit: Admission: EM | Admit: 2021-02-21 | Discharge: 2021-02-21 | Discharge status: Absent without leave | Payer: 59

## 2021-02-21 ENCOUNTER — Other Ambulatory Visit: Payer: Self-pay

## 2021-02-25 ENCOUNTER — Encounter: Payer: Self-pay | Admitting: Internal Medicine

## 2021-02-25 ENCOUNTER — Ambulatory Visit: Payer: Medicaid Other | Admitting: Internal Medicine

## 2021-03-04 ENCOUNTER — Ambulatory Visit: Payer: 59

## 2021-03-04 ENCOUNTER — Encounter: Payer: Self-pay | Admitting: Orthopaedic Surgery

## 2021-03-04 ENCOUNTER — Ambulatory Visit (INDEPENDENT_AMBULATORY_CARE_PROVIDER_SITE_OTHER): Payer: 59 | Admitting: Orthopaedic Surgery

## 2021-03-04 ENCOUNTER — Other Ambulatory Visit: Payer: Self-pay | Admitting: Internal Medicine

## 2021-03-04 ENCOUNTER — Other Ambulatory Visit: Payer: Self-pay

## 2021-03-04 VITALS — BP 130/90 | HR 99 | Ht 60.0 in | Wt 120.4 lb

## 2021-03-04 DIAGNOSIS — M792 Neuralgia and neuritis, unspecified: Secondary | ICD-10-CM | POA: Diagnosis not present

## 2021-03-04 DIAGNOSIS — R29898 Other symptoms and signs involving the musculoskeletal system: Secondary | ICD-10-CM

## 2021-03-04 DIAGNOSIS — G8929 Other chronic pain: Secondary | ICD-10-CM

## 2021-03-04 DIAGNOSIS — F418 Other specified anxiety disorders: Secondary | ICD-10-CM

## 2021-03-04 DIAGNOSIS — M25512 Pain in left shoulder: Secondary | ICD-10-CM | POA: Diagnosis not present

## 2021-03-04 DIAGNOSIS — M542 Cervicalgia: Secondary | ICD-10-CM

## 2021-03-04 MED ORDER — HYDROCODONE-ACETAMINOPHEN 5-325 MG PO TABS: 1.0000 | ORAL_TABLET | ORAL | 0 refills | Status: AC | PRN

## 2021-03-04 MED ORDER — DIAZEPAM 10 MG PO TABS: 10.0000 mg | ORAL_TABLET | Freq: Once | ORAL | 0 refills | Status: DC

## 2021-03-04 NOTE — Patient Instructions (Addendum)
While we are working on your approval for MRI please go ahead and call to schedule your appointment with [facility] within at least one (1) week.   Central Scheduling [phone]  Talk to your primary care as soon as possible regarding your left arm weakness, it is significantly weak and since you have a history of blood clot, your primary care may need to make sure you have not had a stroke. This is especially worrisome since you have other symptoms as well.

## 2021-03-04 NOTE — Progress Notes (Signed)
Subjective:    Patient ID: Kimberly Rocha, female    DOB: Aug 21, 1975, 46 y.o.   MRN: [id number]  HPI Around January 20 she experienced weakness of the left arm and hand and inability to use the arm for a short period of time.  She communicated to her family doctor.  About a week later she went to Urgent Care and was seen.  She complains now of left shoulder pain with pain running to the index and long fingers and weakness of the left arm.  She has no trauma.  She can move her shoulder fairly well.  She has no redness.  The weakness persists at times.  She has a history of pulmonary embolus in 2021.  Last year she had an additional work-up plus CT exams for this.  She has no memory or vision problems.  She is concerned her arm is weak.     Review of Systems  Constitutional:  Positive for activity change.  Respiratory:         History of pulmonary embolism.  Musculoskeletal:  Positive for arthralgias, myalgias and neck pain.  All other systems reviewed and are negative. For Review of Systems, all other systems reviewed and are negative.  The following is a summary of the past history medically, past history surgically, known current medicines, social history and family history.  This information is gathered electronically by the computer from prior information and documentation.  I review this each visit and have found including this information at this point in the chart is beneficial and informative.   Past Medical History:  Diagnosis Date   Borderline high cholesterol    Borderline hypertension    Gallstones    Thyroid disease     Past Surgical History:  Procedure Laterality Date   CESAREAN SECTION     CHOLECYSTECTOMY N/A 08/22/2012   Procedure: LAPAROSCOPIC CHOLECYSTECTOMY;  Surgeon: Jamesetta So, MD;  Location: [facility];  Service: General;  Laterality: N/A;   GALLBLADDER SURGERY     HERNIA REPAIR     TUBAL LIGATION      Current Outpatient Medications on File Prior  to Visit  Medication Sig Dispense Refill   clindamycin (CLINDAGEL) 1 % gel APPLY TO THE AFFECTED AREA TWICE DAILY 30 g 1   escitalopram (LEXAPRO) 20 MG tablet TAKE 1 TABLET(20 MG) BY MOUTH DAILY 30 tablet 2   hydrOXYzine (VISTARIL) 50 MG capsule TAKE 1 CAPSULE BY MOUTH THREE TIMES A DAYAS NEEDED 90 capsule 0   levothyroxine (SYNTHROID) 25 MCG tablet Take 1 tablet (25 mcg total) by mouth daily. 90 tablet 1   mirtazapine (REMERON) 45 MG tablet Take 1 tablet (45 mg total) by mouth at bedtime. 30 tablet 3   nystatin (MYCOSTATIN) 100000 UNIT/ML suspension Take 5 mLs (500,000 Units total) by mouth 4 (four) times daily. 60 mL 0   rosuvastatin (CRESTOR) 10 MG tablet TAKE 1 TABLET BY MOUTH DAILY 90 tablet 0   triamcinolone cream (KENALOG) 0.1 % Apply 1 application topically 2 (two) times daily. 30 g 0   valACYclovir (VALTREX) 1000 MG tablet Take 1 tablet (1,000 mg total) by mouth 2 (two) times daily. 20 tablet 1   No current facility-administered medications on file prior to visit.    Social History   Socioeconomic History   Marital status: Married    Spouse name: Not on file   Number of children: Not on file   Years of education: Not on file   Highest education level: Not  on file  Occupational History   Not on file  Tobacco Use   Smoking status: Former    Packs/day: 0.50    Types: Cigarettes   Smokeless tobacco: Never  Vaping Use   Vaping Use: Former  Substance and Sexual Activity   Alcohol use: Yes    Comment: occ   Drug use: Yes    Frequency: 3.0 times per week    Types: Marijuana    Comment: twice a day   Sexual activity: Yes    Birth control/protection: Surgical    Comment: tubal  Other Topics Concern   Not on file  Social History Narrative   Not on file   Social Determinants of Health   Financial Resource Strain: Medium Risk   Difficulty of Paying Living Expenses: Somewhat hard  Food Insecurity: Food Insecurity Present   Worried About Running Out of Food in the Last  Year: Sometimes true   Ran Out of Food in the Last Year: Sometimes true  Transportation Needs: No Transportation Needs   Lack of Transportation (Medical): No   Lack of Transportation (Non-Medical): No  Physical Activity: Insufficiently Active   Days of Exercise per Week: 2 days   Minutes of Exercise per Session: 20 min  Stress: Stress Concern Present   Feeling of Stress : Very much  Social Connections: Moderately Isolated   Frequency of Communication with Friends and Family: More than three times a week   Frequency of Social Gatherings with Friends and Family: Twice a week   Attends Religious Services: More than 4 times per year   Active Member of [employer] or Organizations: No   Attends Music therapist: Never   Marital Status: Never married  Human resources officer Violence: Not At Risk   Fear of Current or Ex-Partner: No   Emotionally Abused: No   Physically Abused: No   Sexually Abused: No    Family History  Problem Relation Age of Onset   COPD Father    COPD Mother    HIV/AIDS Brother    Other Daughter        has issues with cervix    BP 130/90    Pulse 99    Ht 5' (1.524 m)    Wt 120 lb 6.4 oz (54.6 kg)    BMI 23.51 kg/m   Body mass index is 23.51 kg/m.     Objective:   Physical Exam Vitals and nursing note reviewed. Exam conducted with a chaperone present.  Constitutional:      Appearance: She is well-developed.  HENT:     Head: Normocephalic and atraumatic.  Eyes:     Conjunctiva/sclera: Conjunctivae normal.     Pupils: Pupils are equal, round, and reactive to light.  Cardiovascular:     Rate and Rhythm: Normal rate and regular rhythm.  Pulmonary:     Effort: Pulmonary effort is normal.  Abdominal:     Palpations: Abdomen is soft.  Musculoskeletal:       Arms:     Cervical back: Normal range of motion and neck supple.  Skin:    General: Skin is warm and dry.  Neurological:     Mental Status: She is alert and oriented to person, place, and time.      Cranial Nerves: No cranial nerve deficit.     Motor: Weakness present. No abnormal muscle tone.     Coordination: Coordination abnormal.     Deep Tendon Reflexes: Reflexes are normal and symmetric. Reflexes normal.  Comments: Weakness of triceps and biceps left, weaker grip left.  Psychiatric:        Behavior: Behavior normal.        Thought Content: Thought content normal.        Judgment: Judgment normal.  X-rays were done of the left shoulder and cervical spine, reported separately.        Assessment & Plan:   Encounter Diagnoses  Name Primary?   Chronic left shoulder pain Yes   Neck pain    Left arm weakness    Radicular pain of left upper extremity    Cervicalgia    I am concerned about the weakness of the left arm and feel it may come from neck or central process.  X-rays of shoulder negative and ROM is full.  I would like to get MRI of the cervical spine because of the weakness and the loss of cervical lordosis.  I have asked her to see her primary doctor and evaluate possibility of prior CVA or transient ischemic attack.  With history of prior pulmonary embolus this is concerning.  Return in two weeks.  Valium given for MRI.  I have reviewed the [place] web site prior to prescribing narcotic medicine for this patient.  Call if any problem.  Precautions discussed.  Electronically Signed Sanjuana Kava, MD 2/7/202311:18 AM

## 2021-03-06 ENCOUNTER — Other Ambulatory Visit: Payer: Self-pay

## 2021-03-06 ENCOUNTER — Ambulatory Visit (INDEPENDENT_AMBULATORY_CARE_PROVIDER_SITE_OTHER): Payer: 59 | Admitting: Internal Medicine

## 2021-03-06 ENCOUNTER — Encounter: Payer: Self-pay | Admitting: Internal Medicine

## 2021-03-06 VITALS — BP 134/88 | HR 85 | Resp 18 | Ht 60.0 in | Wt 121.0 lb

## 2021-03-06 DIAGNOSIS — Z2821 Immunization not carried out because of patient refusal: Secondary | ICD-10-CM

## 2021-03-06 DIAGNOSIS — E782 Mixed hyperlipidemia: Secondary | ICD-10-CM | POA: Diagnosis not present

## 2021-03-06 DIAGNOSIS — G459 Transient cerebral ischemic attack, unspecified: Secondary | ICD-10-CM

## 2021-03-06 DIAGNOSIS — F418 Other specified anxiety disorders: Secondary | ICD-10-CM

## 2021-03-06 DIAGNOSIS — Z1211 Encounter for screening for malignant neoplasm of colon: Secondary | ICD-10-CM

## 2021-03-06 DIAGNOSIS — F40240 Claustrophobia: Secondary | ICD-10-CM

## 2021-03-06 DIAGNOSIS — R200 Anesthesia of skin: Secondary | ICD-10-CM

## 2021-03-06 DIAGNOSIS — R202 Paresthesia of skin: Secondary | ICD-10-CM

## 2021-03-06 MED ORDER — DIAZEPAM 10 MG PO TABS: 10.0000 mg | ORAL_TABLET | Freq: Once | ORAL | 0 refills | Status: AC

## 2021-03-06 NOTE — Assessment & Plan Note (Signed)
Unclear etiology currently Check MRI of the cervical spine as she has neck pain as well X-ray of cervical spine showed possible spinal stenosis

## 2021-03-06 NOTE — Patient Instructions (Signed)
You are being scheduled to get MRI of brain.

## 2021-03-06 NOTE — Assessment & Plan Note (Signed)
Uncontrolled with Remeron 45 mg qHS and Vistaril 50 mg TID PRN On Lexapro 20 mg QD Had referred to psychiatry/BH therapy

## 2021-03-06 NOTE — Assessment & Plan Note (Signed)
On statin currently ?

## 2021-03-06 NOTE — Assessment & Plan Note (Addendum)
Her symptoms could be from cervical spinal stenosis versus TIA Considering her history of PE, will check MRI of brain to rule out ischemic etiology for her symptoms On statin currently

## 2021-03-06 NOTE — Progress Notes (Signed)
Acute Office Visit  Subjective:    Patient ID: Kimberly Rocha, female    DOB: 12-13-1975, 46 y.o.   MRN: [id number]  Chief Complaint  Patient presents with   Extremity Weakness    Pt having left side weakness for 1 month saw keeling this past Tuesday and he told her to follow up with pcp to have ct ordered for possible stroke work up     HPI Patient is in today for c/o left arm numbness and weakness for last 1 month.  She was recently seen in urgent care and was referred to orthopedic surgeon for left arm swelling and weakness.  She was given naproxen, which helped her with the swelling.  She also reports recent change in her memory and dizziness with vision problems.  Of note, she has history of PE in the past.  She denies any dysphagia or dysphonia currently.  Denies any urinary or stool incontinence.  Of note, she has chronic neck pain and is scheduled to get MRI of cervical spine.  Past Medical History:  Diagnosis Date   Borderline high cholesterol    Borderline hypertension    Gallstones    Thyroid disease     Past Surgical History:  Procedure Laterality Date   CESAREAN SECTION     CHOLECYSTECTOMY N/A 08/22/2012   Procedure: LAPAROSCOPIC CHOLECYSTECTOMY;  Surgeon: Jamesetta So, MD;  Location: [facility];  Service: General;  Laterality: N/A;   GALLBLADDER SURGERY     HERNIA REPAIR     TUBAL LIGATION      Family History  Problem Relation Age of Onset   COPD Father    COPD Mother    HIV/AIDS Brother    Other Daughter        has issues with cervix    Social History   Socioeconomic History   Marital status: Married    Spouse name: Not on file   Number of children: Not on file   Years of education: Not on file   Highest education level: Not on file  Occupational History   Not on file  Tobacco Use   Smoking status: Former    Packs/day: 0.50    Types: Cigarettes   Smokeless tobacco: Never  Vaping Use   Vaping Use: Former  Substance and Sexual Activity    Alcohol use: Yes    Comment: occ   Drug use: Yes    Frequency: 3.0 times per week    Types: Marijuana    Comment: twice a day   Sexual activity: Yes    Birth control/protection: Surgical    Comment: tubal  Other Topics Concern   Not on file  Social History Narrative   Not on file   Social Determinants of Health   Financial Resource Strain: Medium Risk   Difficulty of Paying Living Expenses: Somewhat hard  Food Insecurity: Food Insecurity Present   Worried About Running Out of Food in the Last Year: Sometimes true   Ran Out of Food in the Last Year: Sometimes true  Transportation Needs: No Transportation Needs   Lack of Transportation (Medical): No   Lack of Transportation (Non-Medical): No  Physical Activity: Insufficiently Active   Days of Exercise per Week: 2 days   Minutes of Exercise per Session: 20 min  Stress: Stress Concern Present   Feeling of Stress : Very much  Social Connections: Moderately Isolated   Frequency of Communication with Friends and Family: More than three times a week   Frequency  of Social Gatherings with Friends and Family: Twice a week   Attends Religious Services: More than 4 times per year   Active Member of [employer] or Organizations: No   Attends Music therapist: Never   Marital Status: Never married  Human resources officer Violence: Not At Risk   Fear of Current or Ex-Partner: No   Emotionally Abused: No   Physically Abused: No   Sexually Abused: No    Outpatient Medications Prior to Visit  Medication Sig Dispense Refill   augmented betamethasone dipropionate (DIPROLENE-AF) 0.05 % cream Apply topically.     clindamycin (CLEOCIN T) 1 % lotion Apply topically every other day.     clindamycin (CLINDAGEL) 1 % gel APPLY TO THE AFFECTED AREA TWICE DAILY 30 g 1   doxycycline (VIBRAMYCIN) 100 MG capsule Take 100 mg by mouth 2 (two) times daily.     escitalopram (LEXAPRO) 20 MG tablet TAKE 1 TABLET(20 MG) BY MOUTH DAILY 30 tablet 2    HYDROcodone-acetaminophen (NORCO/VICODIN) 5-325 MG tablet Take 1 tablet by mouth every 4 (four) hours as needed for up to 5 days for moderate pain. 30 tablet 0   hydrOXYzine (VISTARIL) 50 MG capsule TAKE 1 CAPSULE BY MOUTH THREE TIMES A DAYAS NEEDED 90 capsule 0   levothyroxine (SYNTHROID) 25 MCG tablet Take 1 tablet (25 mcg total) by mouth daily. 90 tablet 1   mirtazapine (REMERON) 45 MG tablet Take 1 tablet (45 mg total) by mouth at bedtime. 30 tablet 3   naproxen (NAPROSYN) 500 MG tablet Take 500 mg by mouth 2 (two) times daily.     nystatin (MYCOSTATIN) 100000 UNIT/ML suspension Take 5 mLs (500,000 Units total) by mouth 4 (four) times daily. 60 mL 0   rosuvastatin (CRESTOR) 10 MG tablet TAKE 1 TABLET BY MOUTH DAILY 90 tablet 0   triamcinolone cream (KENALOG) 0.1 % Apply 1 application topically 2 (two) times daily. 30 g 0   diazepam (VALIUM) 10 MG tablet Take 10 mg by mouth daily as needed.     valACYclovir (VALTREX) 1000 MG tablet Take 1 tablet (1,000 mg total) by mouth 2 (two) times daily. 20 tablet 1   No facility-administered medications prior to visit.    Allergies  Allergen Reactions   Demerol Hives and Itching    Review of Systems  Constitutional:  Positive for fatigue. Negative for chills and fever.  HENT:  Negative for congestion, sinus pressure, sinus pain and sore throat.   Eyes:  Negative for pain and discharge.  Respiratory:  Negative for cough and shortness of breath.   Cardiovascular:  Negative for chest pain and palpitations.  Gastrointestinal:  Negative for abdominal pain, diarrhea, nausea and vomiting.  Endocrine: Positive for heat intolerance. Negative for polydipsia and polyuria.  Genitourinary:  Negative for dysuria and hematuria.  Musculoskeletal:  Positive for arthralgias, back pain and neck pain. Negative for neck stiffness.  Skin:  Positive for rash.  Neurological:  Positive for dizziness, weakness, numbness and headaches.  Psychiatric/Behavioral:  Positive  for agitation, dysphoric mood and sleep disturbance. Negative for behavioral problems. The patient is nervous/anxious.       Objective:    Physical Exam Vitals reviewed.  Constitutional:      General: She is not in acute distress.    Appearance: She is not diaphoretic.  HENT:     Head: Normocephalic and atraumatic.     Nose: Nose normal.     Mouth/Throat:     Mouth: Mucous membranes are moist.  Eyes:  General: No scleral icterus.    Extraocular Movements: Extraocular movements intact.  Cardiovascular:     Rate and Rhythm: Normal rate and regular rhythm.     Pulses: Normal pulses.     Heart sounds: Normal heart sounds. No murmur heard. Pulmonary:     Breath sounds: Normal breath sounds. No wheezing or rales.  Musculoskeletal:     Cervical back: Neck supple. No tenderness.     Right lower leg: No edema.     Left lower leg: No edema.  Skin:    General: Skin is warm.     Comments: Mild tenderness around nailbed area of b/l fingertips  Neurological:     General: No focal deficit present.     Mental Status: She is alert and oriented to person, place, and time.     Sensory: Sensory deficit (Bilateral hands) present.     Motor: Weakness (4/5 in left UE) present.  Psychiatric:        Mood and Affect: Mood normal.        Behavior: Behavior normal.    BP 134/88 (BP Location: Right Arm, Patient Position: Sitting, Cuff Size: Normal)    Pulse 85    Resp 18    Ht 5' (1.524 m)    Wt 121 lb 0.6 oz (54.9 kg)    SpO2 100%    BMI 23.64 kg/m  Wt Readings from Last 3 Encounters:  03/06/21 121 lb 0.6 oz (54.9 kg)  03/04/21 120 lb 6.4 oz (54.6 kg)  01/24/21 120 lb 9.6 oz (54.7 kg)        Assessment & Plan:   Problem List Items Addressed This Visit       Cardiovascular and Mediastinum   TIA (transient ischemic attack)    Her symptoms could be from cervical spinal stenosis versus TIA Considering her history of PE, will check MRI of brain to rule out ischemic etiology for her  symptoms On statin currently      Relevant Orders   MR Brain Wo Contrast     Other   Anxiety with depression    Uncontrolled with Remeron 45 mg qHS and Vistaril 50 mg TID PRN On Lexapro 20 mg QD Had referred to psychiatry/BH therapy      Relevant Medications   diazepam (VALIUM) 10 MG tablet   Mixed hyperlipidemia    On statin currently      Numbness and tingling in left arm - Primary    Unclear etiology currently Check MRI of the cervical spine as she has neck pain as well X-ray of cervical spine showed possible spinal stenosis      Relevant Orders   MR Brain Wo Contrast   Other Visit Diagnoses     Claustrophobia       Relevant Medications   diazepam (VALIUM) 10 MG tablet   Special screening for malignant neoplasms, colon       Relevant Orders   Ambulatory referral to Gastroenterology   Refused influenza vaccine            Meds ordered this encounter  Medications   diazepam (VALIUM) 10 MG tablet    Sig: Take 1 tablet (10 mg total) by mouth once for 1 dose. 1 hour before MRI.    Dispense:  1 tablet    Refill:  0     Mykira Hofmeister Keith Rake, MD

## 2021-03-09 ENCOUNTER — Telehealth: Payer: Self-pay | Admitting: Orthopaedic Surgery

## 2021-03-09 ENCOUNTER — Other Ambulatory Visit: Payer: Self-pay | Admitting: Internal Medicine

## 2021-03-09 ENCOUNTER — Encounter: Payer: Self-pay | Admitting: Internal Medicine

## 2021-03-09 DIAGNOSIS — F418 Other specified anxiety disorders: Secondary | ICD-10-CM

## 2021-03-09 DIAGNOSIS — B370 Candidal stomatitis: Secondary | ICD-10-CM

## 2021-03-10 ENCOUNTER — Other Ambulatory Visit: Payer: Self-pay | Admitting: Adult Health

## 2021-03-10 ENCOUNTER — Other Ambulatory Visit: Payer: Self-pay | Admitting: *Deleted

## 2021-03-10 ENCOUNTER — Other Ambulatory Visit (HOSPITAL_COMMUNITY): Payer: Self-pay

## 2021-03-10 DIAGNOSIS — F418 Other specified anxiety disorders: Secondary | ICD-10-CM

## 2021-03-10 MED ORDER — HYDROXYZINE PAMOATE 50 MG PO CAPS
50.0000 mg | ORAL_CAPSULE | Freq: Three times a day (TID) | ORAL | 0 refills | Status: DC | PRN
  Filled 2021-03-10: qty 90, 30d supply, fill #0

## 2021-03-10 MED ORDER — MIRTAZAPINE 45 MG PO TABS: 45.0000 mg | ORAL_TABLET | Freq: Every day | ORAL | 3 refills | Status: DC

## 2021-03-11 ENCOUNTER — Other Ambulatory Visit (HOSPITAL_COMMUNITY): Payer: Self-pay

## 2021-03-13 ENCOUNTER — Ambulatory Visit (HOSPITAL_COMMUNITY)
Admission: RE | Admit: 2021-03-13 | Discharge: 2021-03-13 | Disposition: A | Discharge status: Home / Self Care | Payer: 59 | Source: Ambulatory Visit | Attending: Internal Medicine | Admitting: Internal Medicine

## 2021-03-13 ENCOUNTER — Ambulatory Visit (HOSPITAL_COMMUNITY)
Admission: RE | Admit: 2021-03-13 | Discharge: 2021-03-13 | Disposition: A | Discharge status: Home / Self Care | Payer: 59 | Source: Ambulatory Visit | Attending: Orthopaedic Surgery | Admitting: Orthopaedic Surgery

## 2021-03-13 ENCOUNTER — Other Ambulatory Visit: Payer: Self-pay

## 2021-03-13 DIAGNOSIS — R200 Anesthesia of skin: Secondary | ICD-10-CM | POA: Diagnosis present

## 2021-03-13 DIAGNOSIS — R202 Paresthesia of skin: Secondary | ICD-10-CM | POA: Diagnosis present

## 2021-03-13 DIAGNOSIS — G459 Transient cerebral ischemic attack, unspecified: Secondary | ICD-10-CM | POA: Diagnosis present

## 2021-03-13 DIAGNOSIS — R29898 Other symptoms and signs involving the musculoskeletal system: Secondary | ICD-10-CM

## 2021-03-13 DIAGNOSIS — M792 Neuralgia and neuritis, unspecified: Secondary | ICD-10-CM | POA: Diagnosis present

## 2021-03-13 DIAGNOSIS — M542 Cervicalgia: Secondary | ICD-10-CM

## 2021-03-13 IMAGING — MR MR HEAD W/O CM
13 of 14 series · 38 of 48 positions shown · non-contrast
Comparison: CT angiogram head [DATE].

CLINICAL DATA: Provided history: Numbness and tingling in left arm.
Transient ischemic attack. Neurologic deficits, left arm and memory
deficits. Additional history provided by scanning technologist:
Patient reports left arm numbness and weakness.

EXAM:
MRI HEAD WITHOUT CONTRAST
TECHNIQUE: Multiplanar, multiecho pulse sequences of the brain and surrounding
structures were obtained without intravenous contrast.

[Series 5: DWI · axial · 3.0mm · 0.77mm/px · z∈[-57,+83]mm · 4 of 48 slices shown (1 of 6)]
[im 1/48]
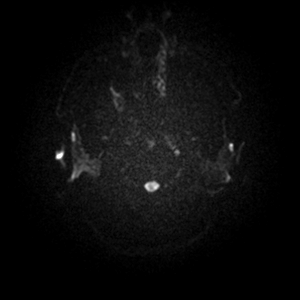
[im 16/48]
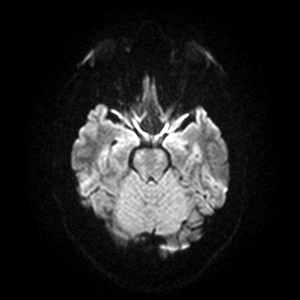
[im 32/48]
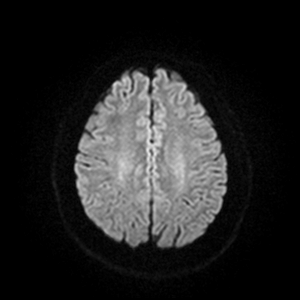
[im 48/48]
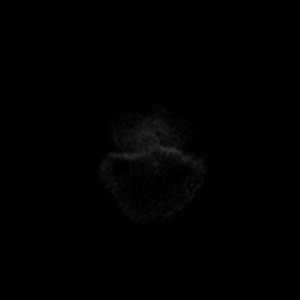

[Series 5: DWI · axial · 3.0mm · 0.77mm/px · z∈[-57,+83]mm · 4 of 48 slices shown (2 of 6)]
[im 1/48]
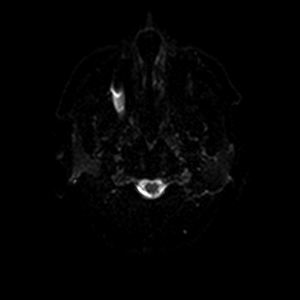
[im 16/48]
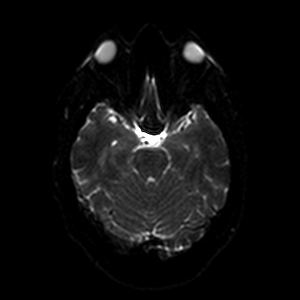
[im 32/48]
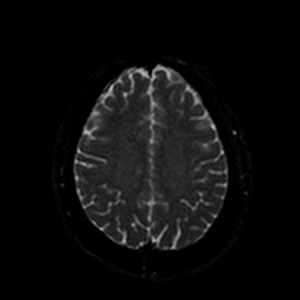
[im 48/48]
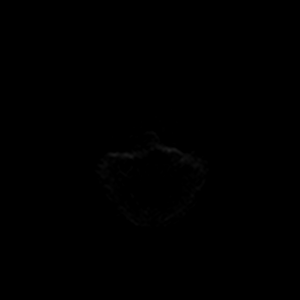

[Series 6: DWI · axial · 3.0mm · 0.77mm/px · z∈[-57,+83]mm · 4 of 48 slices shown (3 of 6)]
[im 1/48]
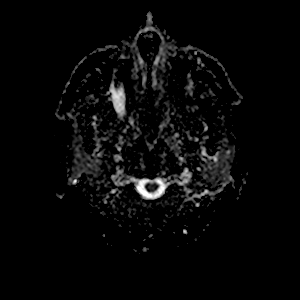
[im 16/48]
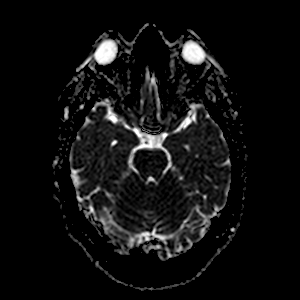
[im 32/48]
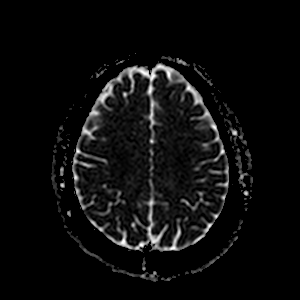
[im 48/48]
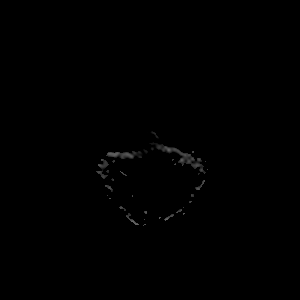

[Series 7: DWI · coronal · 5.0mm · 0.88mm/px · 2 of 28 slices shown (4 of 6)]
[im 1/28]
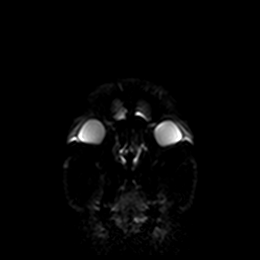
[im 28/28]
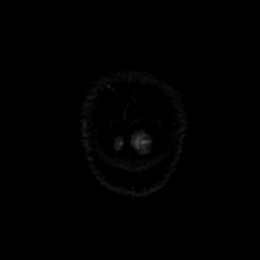

[Series 7: DWI · coronal · 5.0mm · 0.88mm/px · 2 of 28 slices shown (5 of 6)]
[im 1/28]
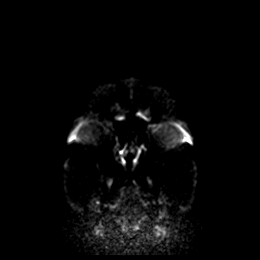
[im 28/28]
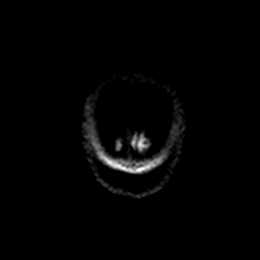

[Series 8: DWI · coronal · 5.0mm · 0.88mm/px · 2 of 28 slices shown (6 of 6)]
[im 1/28]
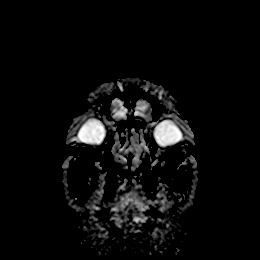
[im 28/28]
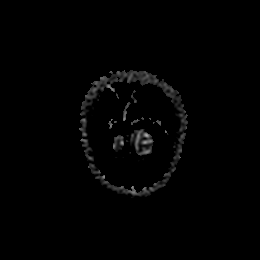

[Series 9: T1 · sagittal · 5.0mm · 0.75mm/px · 2 of 21 slices shown]
[im 1/21]
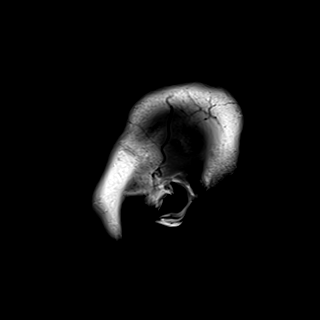
[im 21/21]
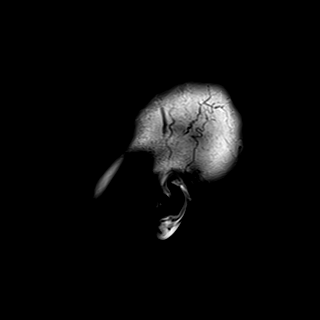

[Series 10: T2 · axial · 5.0mm · 0.72mm/px · 1 of 20 slices shown (1 of 2)]
[im 1/20]
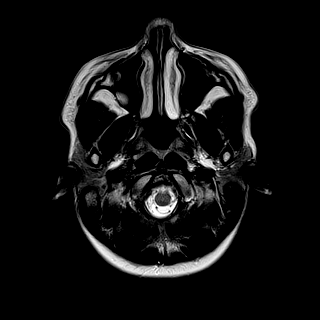

[Series 11: mag_images · axial · 3.0mm · 0.90mm/px · z∈[-75,+101]mm · 4 of 60 slices shown]
[im 1/60]
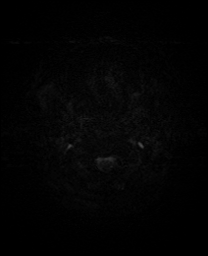
[im 20/60]
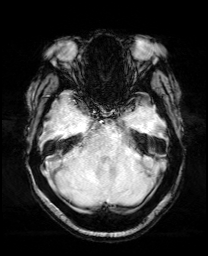
[im 40/60]
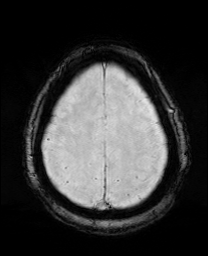
[im 60/60]
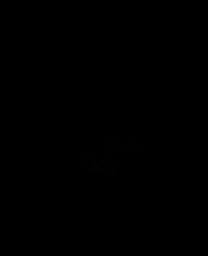

[Series 12: pha_images · axial · 3.0mm · 0.90mm/px · z∈[-72,+98]mm · 4 of 58 slices shown]
[im 1/58]
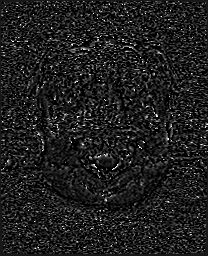
[im 20/58]
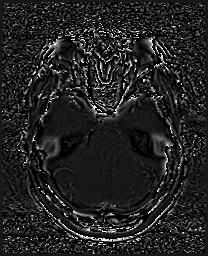
[im 39/58]
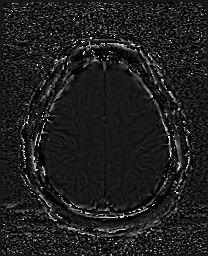
[im 58/58]
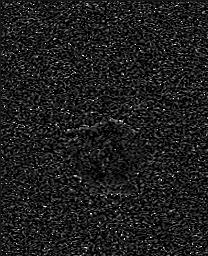

[Series 13: swi_images · axial · 3.0mm · 0.90mm/px · z∈[-75,+101]mm · 4 of 60 slices shown]
[im 1/60]
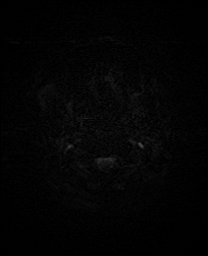
[im 20/60]
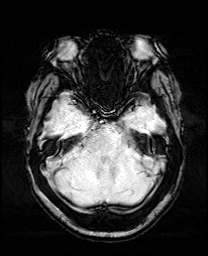
[im 40/60]
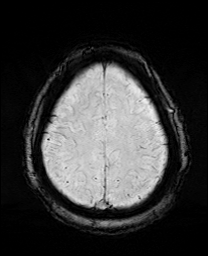
[im 60/60]
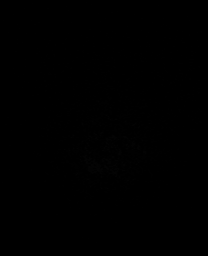

[Series 15: FLAIR · axial · 3.0mm · 0.45mm/px · z∈[-55,+82]mm · 3 of 47 slices shown]
[im 1/47]
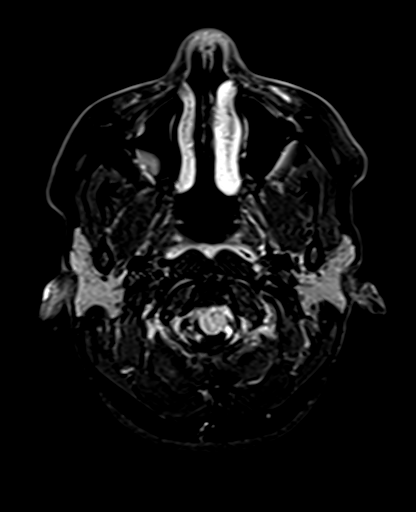
[im 24/47]
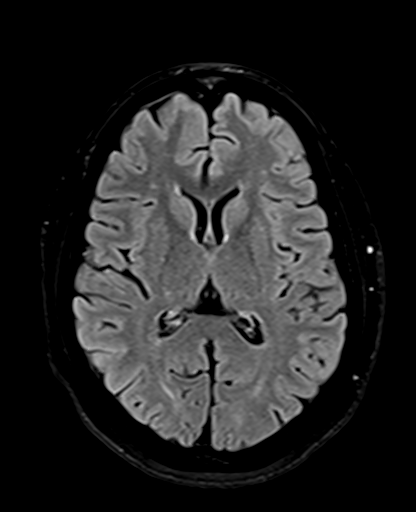
[im 47/47]
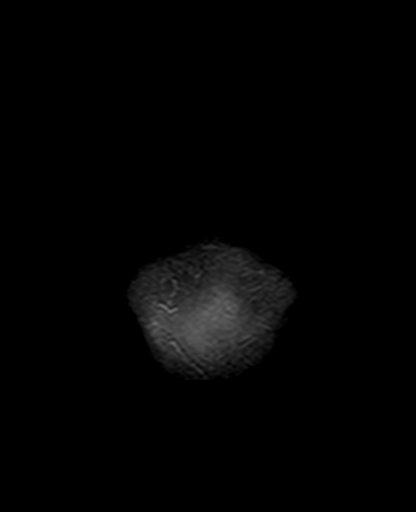

[Series 17: T2 · coronal · 5.0mm · 0.72mm/px · 2 of 29 slices shown (2 of 2)]
[im 1/29]
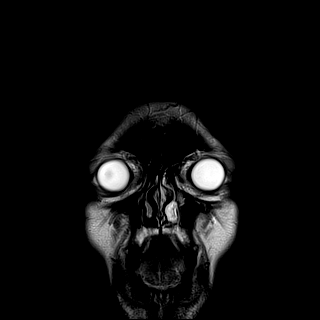
[im 29/29]
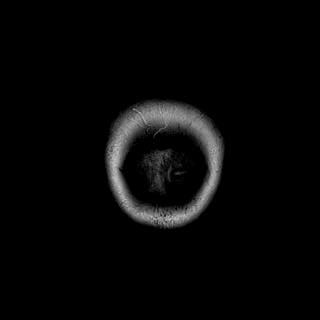

[38 of 48 positions shown; findings below may reference images not displayed]

FINDINGS: Brain:

Cerebral volume is normal.

There are a few small scattered foci of T2 FLAIR hyperintense signal
abnormality within the bilateral cerebral white matter and pons
(measuring up to 5 mm).

No cortical encephalomalacia is identified.

There is no acute infarct.

No evidence of an intracranial mass.

No chronic intracranial blood products.

No extra-axial fluid collection.

No midline shift.

Vascular: Maintained flow voids within the proximal large arterial
vessels.

Skull and upper cervical spine: No focal suspicious marrow lesion.
Incompletely assessed cervical spondylosis.

Sinuses/Orbits: Visualized orbits show no acute finding. 1.5 cm
mucous retention cyst within the right maxillary sinus. Trace
mucosal thickening within the bilateral ethmoid air cells.
IMPRESSION: No evidence of acute intracranial abnormality.

There are a few small scattered foci of T2 FLAIR hyperintense signal
abnormality within the bilateral cerebral white matter and pons
(measuring up to 5 mm). These signal changes are nonspecific and
differential considerations include chronic small vessel ischemic
disease, sequela of chronic migraine headaches, sequela of a prior
infectious/inflammatory process or sequela of a demyelinating
process (such as multiple sclerosis), among others.

Otherwise unremarkable non-contrast MRI appearance of the brain.

Paranasal sinus disease, as described.

## 2021-03-13 IMAGING — MR MR CERVICAL SPINE W/O CM
5 series · 35 of 48 positions shown · non-contrast
Comparison: Radiographs of the cervical spine [DATE] (images
available, report unavailable).

CLINICAL DATA: Neck pain. Left arm weakness. Radicular pain of left
upper extremity. Cervicalgia. Neck pain, chronic. Additional history
provided by scanning technologist: Patient reports left arm numbness
and weakness.

EXAM:
MRI CERVICAL SPINE WITHOUT CONTRAST
TECHNIQUE: Multiplanar, multisequence MR imaging of the cervical spine was
performed. No intravenous contrast was administered.

[Series 5: T2 · sagittal · 3.0mm · 0.69mm/px · 7 of 15 slices shown (1 of 2)]
[im 1/15]
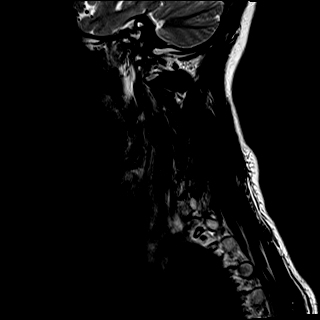
[im 3/15]
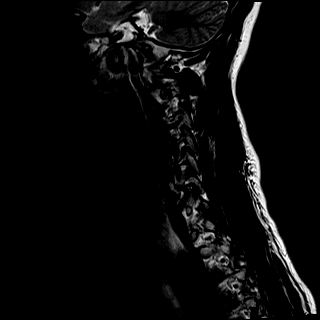
[im 5/15]
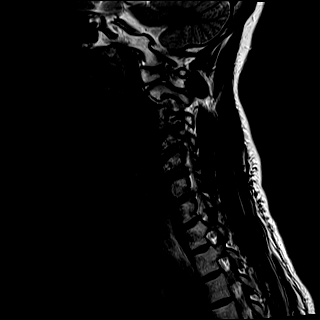
[im 8/15]
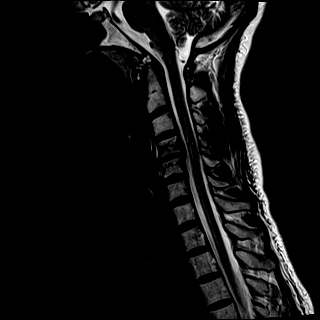
[im 10/15]
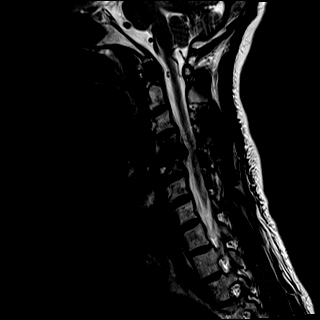
[im 12/15]
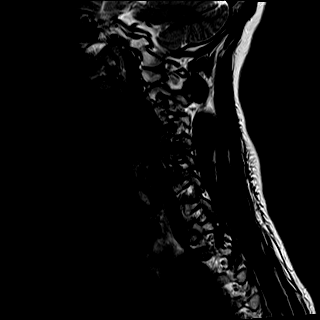
[im 15/15]
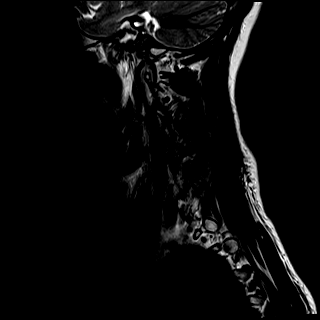

[Series 6: T1 · sagittal · 3.0mm · 0.86mm/px · 7 of 15 slices shown]
[im 1/15]
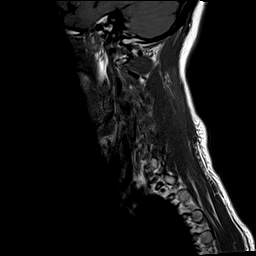
[im 3/15]
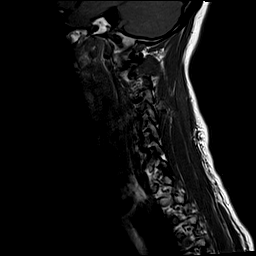
[im 5/15]
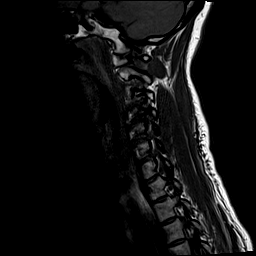
[im 8/15]
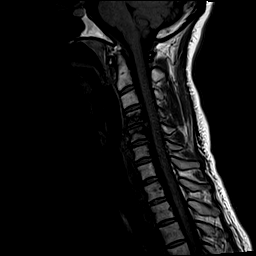
[im 10/15]
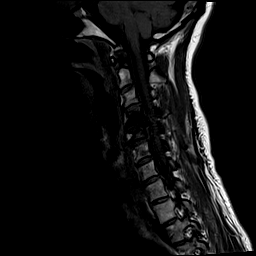
[im 12/15]
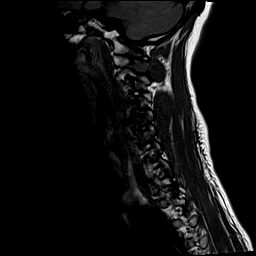
[im 15/15]
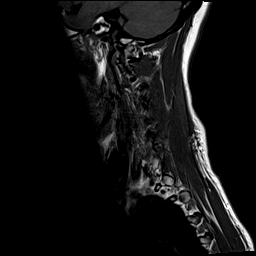

[Series 7: STIR · sagittal · 3.0mm · 0.69mm/px · 6 of 15 slices shown]
[im 1/15]
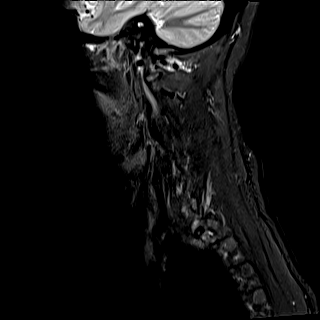
[im 3/15]
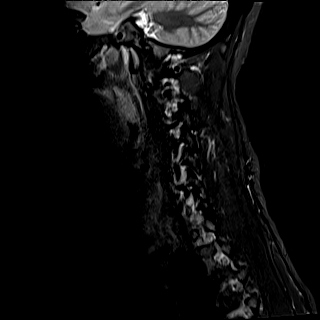
[im 6/15]
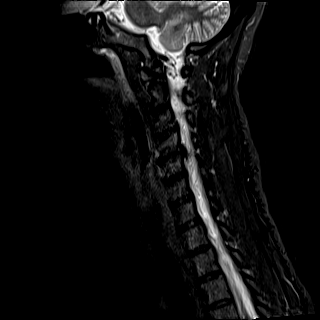
[im 9/15]
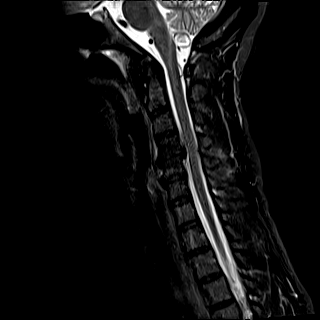
[im 12/15]
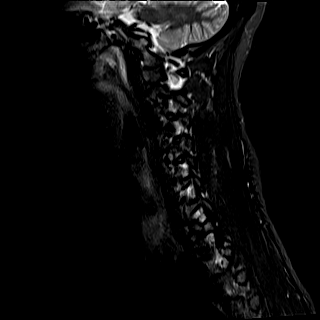
[im 15/15]
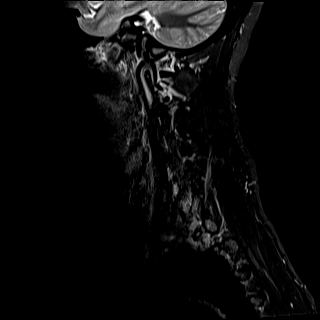

[Series 8: T2 · axial · 3.0mm · 0.70mm/px · z∈[-96,+9]mm · 8 of 33 slices shown (2 of 2)]
[im 1/33]
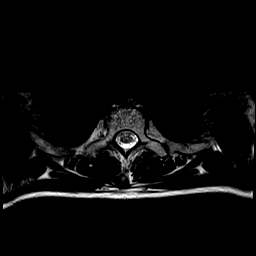
[im 5/33]
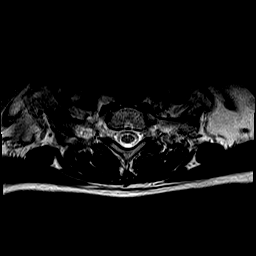
[im 10/33]
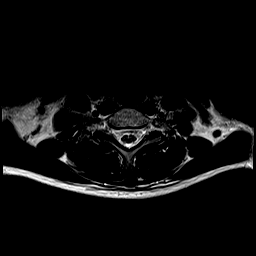
[im 15/33]
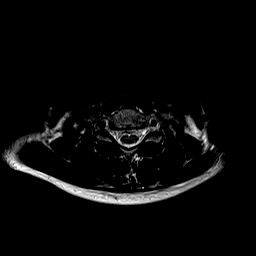
[im 18/33]
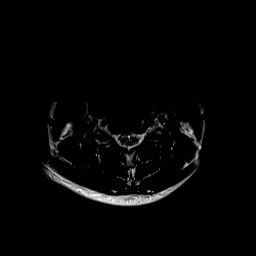
[im 23/33]
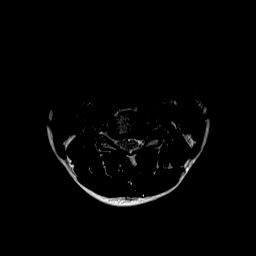
[im 28/33]
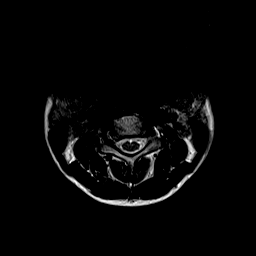
[im 33/33]
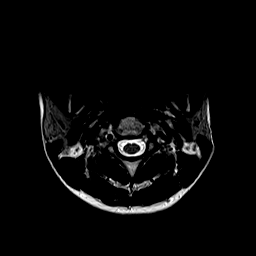

[Series 9: GRE · axial · 3.0mm · 0.35mm/px · z∈[-96,-7]mm · 7 of 33 slices shown]
[im 1/33]
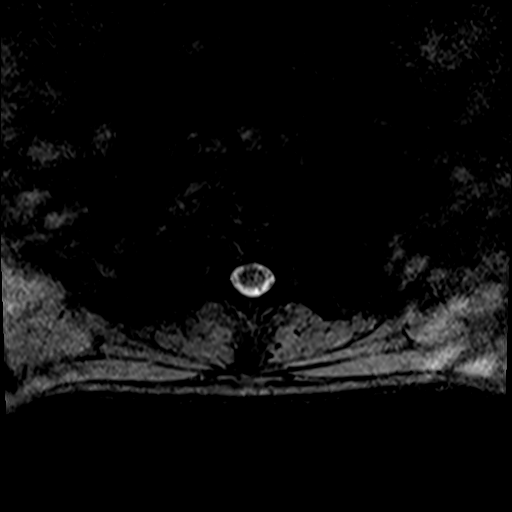
[im 5/33]
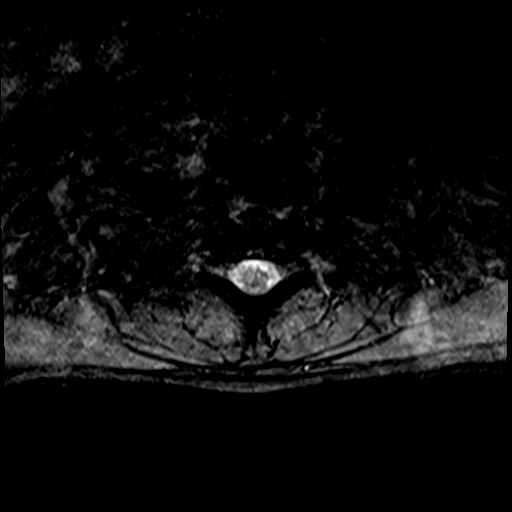
[im 10/33]
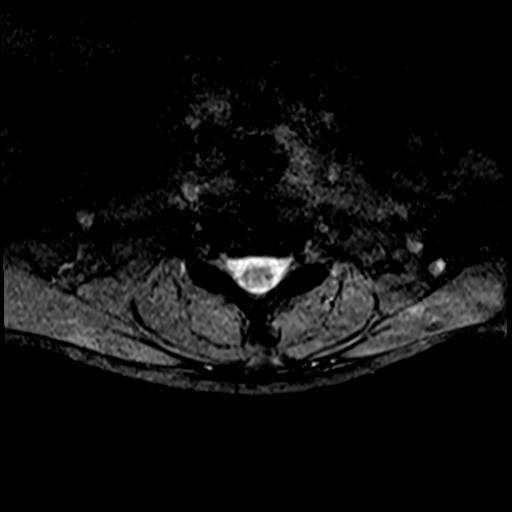
[im 15/33]
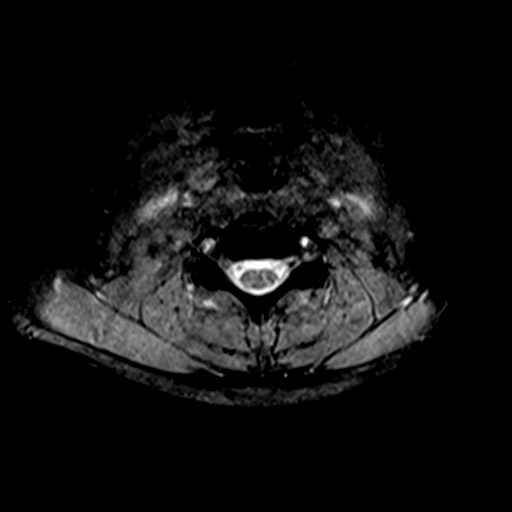
[im 18/33]
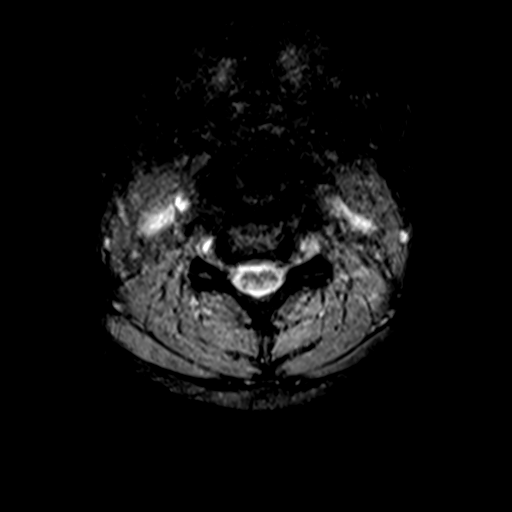
[im 23/33]
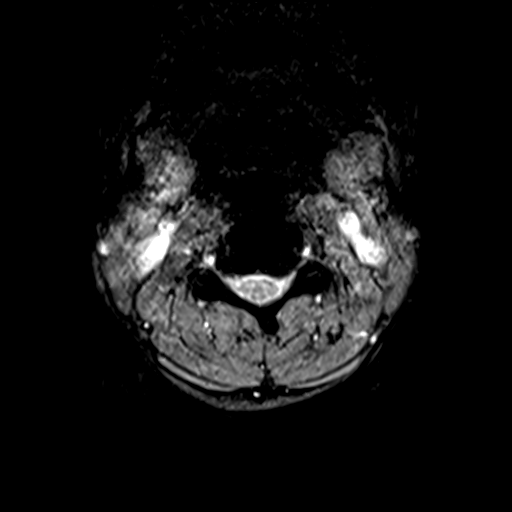
[im 28/33]
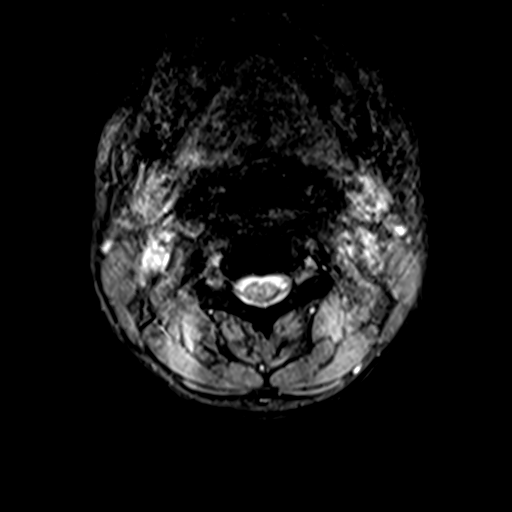

[35 of 48 positions shown; findings below may reference images not displayed]

FINDINGS: Intermittently motion degraded examination. Most notably, there is
mild-to-moderate motion degradation of the sagittal STIR sequence,
of the axial T2 TSE sequence and of the axial T2 GRE sequence.

Alignment: Straightening of the expected cervical lordosis. Trace
C4-C5 grade 1 retrolisthesis.

Vertebrae: Vertebral body height is maintained. Somewhat prominent
degenerative endplate sclerosis at C4-C5. Trace degenerative
endplate edema at C4-C5 and C5-C6. Elsewhere, no significant marrow
edema or focal suspicious osseous lesion is identified.

Cord: No signal abnormality identified within the cervical spinal
cord.

Posterior Fossa, vertebral arteries, paraspinal tissues: Posterior
fossa better assessed on same-day brain MRI. Flow voids preserved
within the imaged cervical vertebral arteries. Paraspinal soft
tissues unremarkable.

Disc levels:

Advanced disc degeneration at C4-C5. No more than mild disc
degeneration at the remaining levels.

C2-C3: No significant disc herniation or spinal canal stenosis.
Uncovertebral hypertrophy on the left with minimal relative left
neural foraminal narrowing.

C3-C4: Shallow disc bulge. Right greater than left uncovertebral
hypertrophy. No significant spinal canal stenosis. Mild right neural
foraminal narrowing. The right vertebral artery is tortuous at this
level and courses within the right neural foramen.

C4-C5: Posterior disc osteophyte complex with bilateral disc
osteophyte ridge/uncinate hypertrophy. Mild spinal canal stenosis
(without significant spinal cord mass effect). Caudal neural
foraminal narrowing (moderate/severe right, severe left).

C5-C6: Shallow disc bulge. Right-sided disc osteophyte
ridge/uncinate hypertrophy. Mild uncovertebral hypertrophy is also
present on the left. No significant spinal canal stenosis. Bilateral
neural foraminal narrowing (moderate right, mild left).

C6-C7: No significant disc herniation or spinal canal stenosis.
Uncovertebral hypertrophy on the right resulting in moderate right
neural foraminal narrowing.

C7-T1: No significant disc herniation or stenosis.
IMPRESSION: Intermittently motion degraded examination.

Cervical spondylosis, as outlined and with findings most notably as
follows.

At C4-C5, there is advanced disc degeneration and trace grade 1
retrolisthesis. Fairly prominent degenerative endplate sclerosis
within the C4 and C5 vertebral bodies. Trace degenerative endplate
edema is also present at this level. Posterior disc osteophyte
complex with bilateral disc osteophyte ridge/uncinate hypertrophy.
Mild spinal canal stenosis (without significant spinal cord mass
effect). Bilateral neural foraminal narrowing (moderate/severe
right, severe left).

No significant spinal canal stenosis at the remaining levels.
Additional sites of foraminal stenosis, as detailed and greatest on
the right at C5-C6 (moderate) and on the right at C6-C7 (moderate).

Trace degenerative endplate edema at C5-C6.

Nonspecific straightening of the expected cervical lordosis.

## 2021-03-14 ENCOUNTER — Other Ambulatory Visit: Payer: Self-pay | Admitting: *Deleted

## 2021-03-14 DIAGNOSIS — Z8669 Personal history of other diseases of the nervous system and sense organs: Secondary | ICD-10-CM

## 2021-03-14 DIAGNOSIS — R9389 Abnormal findings on diagnostic imaging of other specified body structures: Secondary | ICD-10-CM

## 2021-03-14 DIAGNOSIS — G459 Transient cerebral ischemic attack, unspecified: Secondary | ICD-10-CM

## 2021-03-18 ENCOUNTER — Ambulatory Visit (INDEPENDENT_AMBULATORY_CARE_PROVIDER_SITE_OTHER): Payer: 59 | Admitting: Orthopaedic Surgery

## 2021-03-18 ENCOUNTER — Encounter: Payer: Self-pay | Admitting: Orthopaedic Surgery

## 2021-03-18 ENCOUNTER — Other Ambulatory Visit: Payer: Self-pay

## 2021-03-18 VITALS — BP 135/90 | HR 86 | Ht 60.0 in | Wt 122.0 lb

## 2021-03-18 DIAGNOSIS — M542 Cervicalgia: Secondary | ICD-10-CM | POA: Diagnosis not present

## 2021-03-18 DIAGNOSIS — M792 Neuralgia and neuritis, unspecified: Secondary | ICD-10-CM

## 2021-03-18 DIAGNOSIS — R29898 Other symptoms and signs involving the musculoskeletal system: Secondary | ICD-10-CM

## 2021-03-18 MED ORDER — HYDROCODONE-ACETAMINOPHEN 5-325 MG PO TABS: ORAL_TABLET | ORAL | 0 refills | Status: DC

## 2021-03-18 NOTE — Patient Instructions (Signed)
You have been referred to [facility] on [address] in [place], they will call you with appointment. If you have not heard anything in one week call them to schedule [phone]   

## 2021-03-18 NOTE — Progress Notes (Signed)
I am only a little better.  She has had neck pain and weakness of left arm.  Her arm is better and she is not dropping things.  She has no new trauma.  She had MRI of the cervical spine which showed: IMPRESSION: Intermittently motion degraded examination.   Cervical spondylosis, as outlined and with findings most notably as follows.   At C4-C5, there is advanced disc degeneration and trace grade 1 retrolisthesis. Fairly prominent degenerative endplate sclerosis within the C4 and C5 vertebral bodies. Trace degenerative endplate edema is also present at this level. Posterior disc osteophyte complex with bilateral disc osteophyte ridge/uncinate hypertrophy. Mild spinal canal stenosis (without significant spinal cord mass effect). Bilateral neural foraminal narrowing (moderate/severe right, severe left).   No significant spinal canal stenosis at the remaining levels. Additional sites of foraminal stenosis, as detailed and greatest on the right at C5-C6 (moderate) and on the right at C6-C7 (moderate).   Trace degenerative endplate edema at T7-D2.   Nonspecific straightening of the expected cervical lordosis.  I have explained the findings.  I would like for her to see neurosurgeon.  Her strength is better but she still hurts and has occasional weakness now.  Left arm strength is much improved today, comparable to the right. Grips are good.  NV intact.  ROM of neck and shoulders are full.  Encounter Diagnoses  Name Primary?   Neck pain Yes   Left arm weakness    Radicular pain of left upper extremity    To see neurosurgeon.  Return here in one month.  Call if any problem.  Precautions discussed.  Electronically Signed Darreld Mclean, MD 2/21/20232:22 PM
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
## 2021-03-21 ENCOUNTER — Encounter: Payer: Self-pay | Admitting: Neurology

## 2021-03-21 ENCOUNTER — Ambulatory Visit (INDEPENDENT_AMBULATORY_CARE_PROVIDER_SITE_OTHER): Payer: 59 | Admitting: Neurology

## 2021-03-21 ENCOUNTER — Other Ambulatory Visit: Payer: Self-pay

## 2021-03-21 VITALS — BP 134/88 | HR 84 | Resp 18 | Ht 60.0 in | Wt 122.0 lb

## 2021-03-21 DIAGNOSIS — F418 Other specified anxiety disorders: Secondary | ICD-10-CM

## 2021-03-21 DIAGNOSIS — R200 Anesthesia of skin: Secondary | ICD-10-CM | POA: Diagnosis not present

## 2021-03-21 DIAGNOSIS — F459 Somatoform disorder, unspecified: Secondary | ICD-10-CM

## 2021-03-21 DIAGNOSIS — R202 Paresthesia of skin: Secondary | ICD-10-CM

## 2021-03-21 NOTE — Patient Instructions (Signed)
Some of your symptoms are related to anxiety.  However, some of it may be due to pinched nerve in the neck as well as carpal tunnel syndrome.  You did not have a stroke and you do not have multiple sclerosis.   Check nerve conduction study of left arm.  Further recommendations pending results.

## 2021-03-24 ENCOUNTER — Encounter: Payer: Self-pay | Admitting: Internal Medicine

## 2021-03-25 ENCOUNTER — Telehealth: Payer: Self-pay | Admitting: Orthopaedic Surgery

## 2021-03-25 NOTE — Telephone Encounter (Signed)
Patient has questions about her diagnosis; said would like to know more about it. Please advise.

## 2021-03-25 NOTE — Telephone Encounter (Signed)
Patient called and we discussed, gave her the info to call Dr Wyline Mood office.

## 2021-03-25 NOTE — Telephone Encounter (Signed)
I called patient Kimberly Rocha to call me back, need to discuss [facility] referral that was faxed for her to Dr Wyline Mood, [facility].

## 2021-03-27 LAB — T4, FREE: Free T4: 0.82 ng/dL (ref 0.82–1.77)

## 2021-03-27 LAB — TSH: TSH: 12.1 u[IU]/mL — ABNORMAL HIGH (ref 0.450–4.500)

## 2021-03-31 ENCOUNTER — Other Ambulatory Visit: Payer: Self-pay

## 2021-03-31 ENCOUNTER — Encounter: Payer: Self-pay | Admitting: Nurse Practitioner

## 2021-03-31 ENCOUNTER — Ambulatory Visit (INDEPENDENT_AMBULATORY_CARE_PROVIDER_SITE_OTHER): Payer: 59 | Admitting: Nurse Practitioner

## 2021-03-31 ENCOUNTER — Ambulatory Visit: Payer: Medicaid Other | Admitting: Internal Medicine

## 2021-03-31 VITALS — BP 115/78 | HR 78 | Ht 60.0 in | Wt 124.8 lb

## 2021-03-31 DIAGNOSIS — E890 Postprocedural hypothyroidism: Secondary | ICD-10-CM | POA: Diagnosis not present

## 2021-03-31 MED ORDER — LEVOTHYROXINE SODIUM 50 MCG PO TABS: 50.0000 ug | ORAL_TABLET | Freq: Every day | ORAL | 1 refills | Status: DC

## 2021-03-31 NOTE — Progress Notes (Signed)
03/31/2021     Endocrinology Follow Up Note    Subjective:    Patient ID: Kimberly Rocha, female    DOB: 07/22/1975, PCP Anabel Halon, MD.   Past Medical History:  Diagnosis Date   Borderline high cholesterol    Borderline hypertension    Gallstones    Thyroid disease     Past Surgical History:  Procedure Laterality Date   CESAREAN SECTION     CHOLECYSTECTOMY N/A 08/22/2012   Procedure: LAPAROSCOPIC CHOLECYSTECTOMY;  Surgeon: Dalia Heading, MD;  Location: [facility];  Service: General;  Laterality: N/A;   GALLBLADDER SURGERY     HERNIA REPAIR     TUBAL LIGATION      Social History   Socioeconomic History   Marital status: Married    Spouse name: Not on file   Number of children: Not on file   Years of education: Not on file   Highest education level: Not on file  Occupational History   Not on file  Tobacco Use   Smoking status: Former    Packs/day: 0.50    Types: Cigarettes   Smokeless tobacco: Never  Vaping Use   Vaping Use: Former  Substance and Sexual Activity   Alcohol use: Yes    Comment: occ   Drug use: Yes    Frequency: 3.0 times per week    Types: Marijuana    Comment: twice a day   Sexual activity: Yes    Birth control/protection: Surgical    Comment: tubal  Other Topics Concern   Not on file  Social History Narrative   Not on file   Social Determinants of Health   Financial Resource Strain: Medium Risk   Difficulty of Paying Living Expenses: Somewhat hard  Food Insecurity: Food Insecurity Present   Worried About Running Out of Food in the Last Year: Sometimes true   Ran Out of Food in the Last Year: Sometimes true  Transportation Needs: No Transportation Needs   Lack of Transportation (Medical): No   Lack of Transportation (Non-Medical): No  Physical Activity: Insufficiently Active   Days of Exercise per Week: 2 days   Minutes of Exercise per Session: 20 min  Stress: Stress Concern Present   Feeling of Stress : Very much   Social Connections: Moderately Isolated   Frequency of Communication with Friends and Family: More than three times a week   Frequency of Social Gatherings with Friends and Family: Twice a week   Attends Religious Services: More than 4 times per year   Active Member of [employer] or Organizations: No   Attends Banker Meetings: Never   Marital Status: Never married    Family History  Problem Relation Age of Onset   COPD Mother    COPD Father    HIV/AIDS Brother    Stroke Maternal Grandmother    Other Daughter        has issues with cervix    Outpatient Encounter Medications as of 03/31/2021  Medication Sig   augmented betamethasone dipropionate (DIPROLENE-AF) 0.05 % cream Apply topically.   clindamycin (CLEOCIN T) 1 % lotion Apply topically every other day.   clindamycin (CLINDAGEL) 1 % gel APPLY TOPICALLY TO THE AFFECTED AREA TWICE DAILY   escitalopram (LEXAPRO) 20 MG tablet TAKE 1 TABLET(20 MG) BY MOUTH DAILY   HYDROcodone-acetaminophen (NORCO/VICODIN) 5-325 MG tablet One tablet every six hours for pain.  Limit 7 days.   hydrOXYzine (VISTARIL) 50 MG capsule Take 1 capsule (  50 mg total) by mouth 3 (three) times daily as needed.   mirtazapine (REMERON) 45 MG tablet Take 1 tablet (45 mg total) by mouth at bedtime.   naproxen (NAPROSYN) 500 MG tablet Take 500 mg by mouth 2 (two) times daily.   nystatin (MYCOSTATIN) 100000 UNIT/ML suspension SHAKE LIQUID AND TAKE 5 ML BY MOUTH FOUR TIMES DAILY   topiramate (TOPAMAX) 25 MG tablet Take 25 mg by mouth 2 (two) times daily.   triamcinolone cream (KENALOG) 0.1 % Apply 1 application topically 2 (two) times daily.   [DISCONTINUED] levothyroxine (SYNTHROID) 25 MCG tablet Take 1 tablet (25 mcg total) by mouth daily.   doxycycline (VIBRAMYCIN) 100 MG capsule Take 100 mg by mouth 2 (two) times daily. (Patient not taking: Reported on 03/31/2021)   levothyroxine (SYNTHROID) 50 MCG tablet Take 1 tablet (50 mcg total) by mouth daily before  breakfast.   No facility-administered encounter medications on file as of 03/31/2021.    ALLERGIES: Allergies  Allergen Reactions   Demerol Hives and Itching    VACCINATION STATUS: Immunization History  Administered Date(s) Administered   Moderna Sars-Covid-2 Vaccination 02/27/2020, 03/27/2020     HPI  Kimberly Rocha is 46 y.o. female who presents today with a medical history as above. she is being seen in follow up after being seen in consultation for hyperthyroidism requested by Anabel Halon, MD.    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  Treatment was successful and she has now been started on thyroid hormone replacement.  she denies dysphagia, choking, no recent voice change.    she denies family history of thyroid dysfunction and denies family hx of thyroid cancer. she denies personal history of goiter. she is not on any thyroid hormone supplements, has been taking antithyroid medication for approximately 30 days. Denies use of Biotin containing supplements.  she is willing to proceed with appropriate work up and therapy for thyrotoxicosis.   Review of systems  Constitutional: + Minimally fluctuating body weight,  current Body mass index is 24.37 kg/m. , + fatigue, no subjective hyperthermia, no subjective hypothermia Eyes: no blurry vision, no xerophthalmia ENT: no sore throat, no nodules palpated in throat, no dysphagia/odynophagia, no hoarseness Cardiovascular: no chest pain, no shortness of breath, no palpitations, no leg swelling Respiratory: no cough, no shortness of breath Gastrointestinal: no nausea/vomiting/diarrhea Musculoskeletal: no muscle/joint aches Skin: no rashes, no hyperemia,  Neurological: no tremors, no numbness, no tingling, no dizziness Psychiatric: no depression, no anxiety   Objective:    BP 115/78    Pulse 78    Ht 5' (1.524 m)    Wt 124 lb 12.8 oz (56.6 kg)    SpO2 99%    BMI 24.37 kg/m   Wt Readings from  Last 3 Encounters:  03/31/21 124 lb 12.8 oz (56.6 kg)  03/21/21 122 lb (55.3 kg)  03/18/21 122 lb (55.3 kg)     BP Readings from Last 3 Encounters:  03/31/21 115/78  03/21/21 134/88  03/18/21 135/90                        Physical Exam- Limited  Constitutional:  Body mass index is 24.37 kg/m. , not in acute distress Eyes:  EOMI, no exophthalmos Neck: Supple Cardiovascular: RRR, no murmurs, rubs, or gallops, no edema Respiratory: Adequate breathing efforts, no crackles, rales, rhonchi, or wheezing Musculoskeletal: no gross deformities, strength intact in all four extremities, no gross restriction of joint movements  Skin:  no rashes, no hyperemia Neurological: no tremor with outstretched hands   CMP     Component Value Date/Time   NA 143 07/25/2020 1029   K 4.5 07/25/2020 1029   CL 98 07/25/2020 1029   CO2 23 07/25/2020 1029   GLUCOSE 109 (H) 07/25/2020 1029   GLUCOSE 104 (H) 04/21/2020 1055   BUN 7 07/25/2020 1029   CREATININE 0.83 07/25/2020 1029   CALCIUM 9.6 08/29/2020 1000   PROT 7.1 07/25/2020 1029   ALBUMIN 4.5 07/25/2020 1029   AST 11 07/25/2020 1029   ALT 8 07/25/2020 1029   ALKPHOS 79 07/25/2020 1029   BILITOT 0.3 07/25/2020 1029   GFRNONAA >60 04/21/2020 1055   GFRAA >60 10/17/2018 1545     CBC    Component Value Date/Time   WBC 16.5 (H) 08/29/2020 1000   WBC 8.8 04/21/2020 1055   RBC 3.69 (L) 08/29/2020 1000   RBC 4.01 04/21/2020 1055   HGB 12.3 08/29/2020 1000   HCT 36.1 08/29/2020 1000   PLT 209 08/29/2020 1000   MCV 98 (H) 08/29/2020 1000   MCH 33.3 (H) 08/29/2020 1000   MCH 33.9 04/21/2020 1055   MCHC 34.1 08/29/2020 1000   MCHC 35.1 04/21/2020 1055   RDW 13.1 08/29/2020 1000   LYMPHSABS 3.0 08/29/2020 1000   MONOABS 0.6 04/21/2020 1055   EOSABS 0.0 08/29/2020 1000   BASOSABS 0.0 08/29/2020 1000     Diabetic Labs (most recent): No results found for: HGBA1C  Lipid Panel     Component Value Date/Time   CHOL 233 (H) 07/25/2020  1029   TRIG 63 07/25/2020 1029   HDL 47 07/25/2020 1029   CHOLHDL 5.0 (H) 07/25/2020 1029   LDLCALC 175 (H) 07/25/2020 1029   LABVLDL 11 07/25/2020 1029     Lab Results  Component Value Date   TSH 12.100 (H) 03/26/2021   TSH 7.990 (H) 01/21/2021   TSH 1.290 11/27/2020   TSH 1.370 11/27/2020   TSH 0.320 (L) 08/29/2020   TSH 0.326 (L) 07/25/2020   FREET4 0.82 03/26/2021   FREET4 1.01 01/21/2021   FREET4 0.98 11/27/2020   FREET4 0.94 11/27/2020   FREET4 1.06 08/29/2020    Results for Gover, Soundra A "TOYE" (MRN [id number]) as of 11/29/2020 09:49  Ref. Range 11/27/2020 13:14 11/27/2020 13:15  TSH Latest Ref Range: 0.450 - 4.500 uIU/mL 1.370 1.290  Triiodothyronine,Free,Serum Latest Ref Range: 2.0 - 4.4 pg/mL 2.8 2.9  T4,Free(Direct) Latest Ref Range: 0.82 - 1.77 ng/dL 4.090.94 8.110.98  Thyroperoxidase Ab SerPl-aCnc Latest Ref Range: 0 - 34 IU/mL  9  Thyroglobulin Antibody Latest Ref Range: 0.0 - 0.9 IU/mL  <1.0    Uptake and Scan from 10/29/20 CLINICAL DATA:  Hyperthyroidism   EXAM: THYROID SCAN AND UPTAKE - 4 AND 24 HOURS   TECHNIQUE: Following oral administration of I-123 capsule, anterior planar imaging was acquired at 24 hours. Thyroid uptake was calculated with a thyroid probe at 4-6 hours and 24 hours.   RADIOPHARMACEUTICALS:  431 uCi I-123 sodium iodide p.o.   COMPARISON:  Thyroid ultrasound 10/07/2020   FINDINGS: Homogeneous tracer distribution in both thyroid lobes.   No focal areas of increased or decreased tracer localization.   4 hour I-123 uptake = 21.7% (normal 5-20%)   24 hour I-123 uptake = 44.3% (normal 10-30%)   IMPRESSION: Normal thyroid scan with elevated 4 hour and 24 hour radio iodine uptakes.   Findings discussed with hyperthyroidism and Graves disease.     Electronically Signed  By: Ulyses Southward M.D.   On: 10/29/2020 17:17   Latest Reference Range & Units 08/29/20 10:00 11/27/20 13:14 11/27/20 13:15 01/21/21 14:04 03/26/21 09:14  TSH 0.450 -  4.500 uIU/mL 0.320 (L) 1.370 1.290 7.990 (H) 12.100 (H)  Triiodothyronine,Free,Serum 2.0 - 4.4 pg/mL 1.6 (L) 2.8 2.9 2.6   T4,Free(Direct) 0.82 - 1.77 ng/dL 6.81 1.57 2.62 0.35 5.97  Thyroperoxidase Ab SerPl-aCnc 0 - 34 IU/mL   9    Thyroglobulin Antibody 0.0 - 0.9 IU/mL   <1.0    (L): Data is abnormally low (H): Data is abnormally high  Assessment & Plan:   1. Hypothyroidism s/p RAI ablation for Graves Disease  she is being seen at a kind request of Anabel Halon, MD.  Her uptake and scan did show 4-hr uptake of 21.7% and 24-hr uptake of 44.3%, consistent with Graves disease and she had RAI ablation on 12/13/20.    Her previsit thyroid function tests are consistent with slight under-replacement.  She is advised to increase her Levothyroxine to 50 mcg po daily before breakfast (she can double up on her 25 mcg dose until she depletes her current supply).   - The correct intake of thyroid hormone (Levothyroxine, Synthroid), is on empty stomach first thing in the morning, with water, separated by at least 30 minutes from breakfast and other medications,  and separated by more than 4 hours from calcium, iron, multivitamins, acid reflux medications (PPIs).  - This medication is a life-long medication and will be needed to correct thyroid hormone imbalances for the rest of your life.  The dose may change from time to time, based on thyroid blood work.  - It is extremely important to be consistent taking this medication, near the same time each morning.  -AVOID TAKING PRODUCTS CONTAINING BIOTIN (commonly found in Hair, Skin, Nails vitamins) AS IT INTERFERES WITH THE VALIDITY OF THYROID FUNCTION BLOOD TESTS.    -Patient is advised to maintain close follow up with Anabel Halon, MD for primary care needs.     I spent 20 minutes in the care of the patient today including review of labs from Thyroid Function, CMP, and other relevant labs ; imaging/biopsy records (current and previous  including abstractions from other facilities); face-to-face time discussing  her lab results and symptoms, medications doses, her options of short and long term treatment based on the latest standards of care / guidelines;   and documenting the encounter.  Kimberly Rocha  participated in the discussions, expressed understanding, and voiced agreement with the above plans.  All questions were answered to her satisfaction. she is encouraged to contact clinic should she have any questions or concerns prior to her return visit.  Follow up plan: Return in about 2 months (around 05/31/2021) for Thyroid follow up, Previsit labs.   Thank you for involving me in the care of this pleasant patient, and I will continue to update you with her progress.    Ronny Bacon, Dr. [facility] [address] [place], [place] [address] Phone: [phone] Fax: [phone]  03/31/2021, 10:05 AM

## 2021-03-31 NOTE — Patient Instructions (Signed)

## 2021-03-31 NOTE — Telephone Encounter (Signed)
Patient called back and left a voicemail 01/28/21 at 1:13 pm stating the facility we referred her to does not take her insurance and if someone could please call her back at [phone].  ? ? ?

## 2021-04-01 NOTE — Telephone Encounter (Signed)
I called, NA, cannot leave msg.  Per website the closest neurosurgeon in network with [facility] is in [place], [place].  Will try to call patient back to discuss.   ?

## 2021-04-03 NOTE — Telephone Encounter (Signed)
Appt scheduled with Dr Ophelia Charter in [place] office.  ?

## 2021-04-04 ENCOUNTER — Encounter: Payer: Self-pay | Admitting: Internal Medicine

## 2021-04-04 ENCOUNTER — Other Ambulatory Visit: Payer: Self-pay | Admitting: Internal Medicine

## 2021-04-04 DIAGNOSIS — R232 Flushing: Secondary | ICD-10-CM

## 2021-04-04 MED ORDER — PROGESTERONE MICRONIZED 100 MG PO CAPS: 100.0000 mg | ORAL_CAPSULE | Freq: Every day | ORAL | 2 refills | Status: DC

## 2021-04-04 NOTE — Telephone Encounter (Signed)
Pt advised with verbal understanding  °

## 2021-04-10 ENCOUNTER — Encounter: Payer: Self-pay | Admitting: Orthopaedic Surgery

## 2021-04-10 ENCOUNTER — Other Ambulatory Visit: Payer: Self-pay

## 2021-04-10 ENCOUNTER — Ambulatory Visit: Payer: 59 | Admitting: Orthopaedic Surgery

## 2021-04-15 ENCOUNTER — Ambulatory Visit: Payer: 59 | Admitting: Orthopaedic Surgery

## 2021-04-17 ENCOUNTER — Encounter: Payer: Self-pay | Admitting: Neurology

## 2021-04-17 DIAGNOSIS — Z029 Encounter for administrative examinations, unspecified: Secondary | ICD-10-CM

## 2021-04-22 ENCOUNTER — Other Ambulatory Visit: Payer: Self-pay

## 2021-04-22 ENCOUNTER — Ambulatory Visit (INDEPENDENT_AMBULATORY_CARE_PROVIDER_SITE_OTHER): Payer: 59 | Admitting: Orthopaedic Surgery

## 2021-04-22 ENCOUNTER — Encounter: Payer: Self-pay | Admitting: Orthopaedic Surgery

## 2021-04-22 VITALS — BP 133/79 | HR 84 | Ht 60.0 in | Wt 120.0 lb

## 2021-04-22 DIAGNOSIS — M65312 Trigger thumb, left thumb: Secondary | ICD-10-CM | POA: Diagnosis not present

## 2021-04-22 NOTE — Progress Notes (Signed)
My thumb is locking ? ?She has developed a trigger thumb on the left.  It has been present several weeks getting worse.  It often locks in extension.  She has no trauma, no numbness. ? ?The left nondominant thumb has triggering and pain at the A1 pulley.  NV intact. ? ?Encounter Diagnosis  ?Name Primary?  ? Trigger thumb, left thumb Yes  ? ?PROCEDURE ? ?Trigger Finger Injection ? ?The left Thumb  has been locking at the A1 pulley.  The patient has been told about injection of the digit.  Surgical correction and excision of the A1 pulley will resolve the problem.  Ani injection in the digit should help but the results may be short lived.  The patient asked appropriate questions and understands the procedure.  The patient has elected for an injection at this time.  Verbal consent was obtained.  A timeout was taken to confirm the proper hand and digit. ? ?Medication ? 1 mL of DepoMedrol 40mg  ? 2 mL of 1% lidocaine plain ? ?Ethyl chloride for anesthesia ? ?Alcohol was used to prepare the skin along with ethyl chloride and then the injection was made at the A1 pulley there were no complications.  It was tolerated well. ? ?A Band-aid dressing was applied. ? ?Call if any problem or difficulty.  ? ?I have told her that surgery is the definitive treatment. ? ?She also has neck problems and I had referred her to a neurosurgeon.  Her insurance is not accepted by any of the local neurosurgeons including the medical schools.  We have arranged for her to be seen by Dr.Yates in [place] on Thursday.  She is agreeable to this. ? ?Call if any problem. ? ?Precautions discussed. ? ?I will see her in one month for the thumb. ? ?Electronically Signed ?Monday, MD ?3/28/20239:15 AM ? ?

## 2021-04-23 ENCOUNTER — Ambulatory Visit: Payer: 59 | Admitting: Adult Health

## 2021-04-24 ENCOUNTER — Ambulatory Visit (INDEPENDENT_AMBULATORY_CARE_PROVIDER_SITE_OTHER): Payer: 59 | Admitting: Orthopaedic Surgery

## 2021-04-24 ENCOUNTER — Encounter: Payer: Self-pay | Admitting: Orthopaedic Surgery

## 2021-04-24 DIAGNOSIS — M4722 Other spondylosis with radiculopathy, cervical region: Secondary | ICD-10-CM

## 2021-04-24 DIAGNOSIS — M65311 Trigger thumb, right thumb: Secondary | ICD-10-CM | POA: Diagnosis not present

## 2021-04-24 NOTE — Progress Notes (Signed)
? ?Office Visit Note ?  ?Patient: Kimberly Rocha           ?Date of Birth: [birth year]           ?MRN: [id number] ?Visit Date: 04/24/2021 ?             ?Requested by: Anabel Halon, MD ?[address] ?[place],  [place] [address] ?PCP: Anabel Halon, MD ? ? ?Assessment & Plan: ?Visit Diagnoses:  ?1. Trigger thumb, right thumb   ?2. Other spondylosis with radiculopathy, cervical region   ? ? ?Plan: We will set up for some physical therapy for treatment of her cervical disc protrusion with severe foraminal stenosis on the left.  She has persistent triggering of the thumb of the right hand she could have trigger thumb release.  Currently the injection has helped some with catching a little bit but is not as painful.  I plan to recheck her in 7 weeks.  We discussed single level cervical fusion if her symptoms do not improve with therapy. ? ?Follow-Up Instructions: Return in about 7 weeks (around 06/12/2021).  ? ?Orders:  ?No orders of the defined types were placed in this encounter. ? ?No orders of the defined types were placed in this encounter. ? ? ? ? Procedures: ?No procedures performed ? ? ?Clinical Data: ?No additional findings. ? ? ?Subjective: ?Chief Complaint  ?Patient presents with  ? Neck - Pain  ? Left Arm - Pain  ? ? ?HPI 46 year old female referred to me by Dr. Hilda Lias with a recent trigger thumb injection on the right and chronic neck and shoulder pain worse on the left than right with advanced disc degeneration at C4-5.  She has moderate to severe foraminal stenosis on the right and severe on the left.  Mild changes at other levels without compression.  Patient states her next been hurting her for years she has not worked in a couple years.  She also has noticed numbness or tingling into her left hand.  No myelopathic changes no falls.  Patient is here with her pregnant daughter.  She not been through therapy.  Additional history patient has past history of pulmonary emphysema past smoker history of  pulmonary embolism, carpal tunnel, current trigger thumb, history of TIA.  Mild small vessel changes on brain MRI scan. ? ?Review of Systems all other systems noncontributory to HPI. ? ? ?Objective: ?Vital Signs: Ht 5' (1.524 m)   Wt 120 lb (54.4 kg)   BMI 23.44 kg/m?  ? ?Physical Exam ?Constitutional:   ?   Appearance: She is well-developed.  ?HENT:  ?   Head: Normocephalic.  ?   Right Ear: External ear normal.  ?   Left Ear: External ear normal. There is no impacted cerumen.  ?Eyes:  ?   Pupils: Pupils are equal, round, and reactive to light.  ?Neck:  ?   Thyroid: No thyromegaly.  ?   Trachea: No tracheal deviation.  ?Cardiovascular:  ?   Rate and Rhythm: Normal rate.  ?Pulmonary:  ?   Effort: Pulmonary effort is normal.  ?Abdominal:  ?   Palpations: Abdomen is soft.  ?Musculoskeletal:  ?   Cervical back: No rigidity.  ?Skin: ?   General: Skin is warm and dry.  ?Neurological:  ?   Mental Status: She is alert and oriented to person, place, and time.  ?Psychiatric:     ?   Behavior: Behavior normal.  ? ? ?Ortho Exam patient has brachial plexus tenderness right  and left moderate Spurling on the left significant trapezial tenderness right and left.  Negative impingement negative drop arm test.  Upper extremity reflexes are 2+ and symmetrical lower extremity reflexes are 2+ and symmetrical no clonus normal heel toe gait.  No atrophy of the upper extremities good isolated motor testing upper extremity symmetrical. ? ?Specialty Comments:  ?No specialty comments available. ? ?Imaging: ?Narrative & Impression  ?CLINICAL DATA:  Neck pain. Left arm weakness. Radicular pain of left ?upper extremity. Cervicalgia. Neck pain, chronic. Additional history ?provided by scanning technologist: Patient reports left arm numbness ?and weakness. ?  ?EXAM: ?MRI CERVICAL SPINE WITHOUT CONTRAST ?  ?TECHNIQUE: ?Multiplanar, multisequence MR imaging of the cervical spine was ?performed. No intravenous contrast was administered. ?   ?COMPARISON:  Radiographs of the cervical spine 03/04/2021 (images ?available, report unavailable). ?  ?FINDINGS: ?Intermittently motion degraded examination. Most notably, there is ?mild-to-moderate motion degradation of the sagittal STIR sequence, ?of the axial T2 TSE sequence and of the axial T2 GRE sequence. ?  ?Alignment: Straightening of the expected cervical lordosis. Trace ?C4-C5 grade 1 retrolisthesis. ?  ?Vertebrae: Vertebral body height is maintained. Somewhat prominent ?degenerative endplate sclerosis at C4-C5. Trace degenerative ?endplate edema at I5-O2 and C5-C6. Elsewhere, no significant marrow ?edema or focal suspicious osseous lesion is identified. ?  ?Cord: No signal abnormality identified within the cervical spinal ?cord. ?  ?Posterior Fossa, vertebral arteries, paraspinal tissues: Posterior ?fossa better assessed on same-day brain MRI. Flow voids preserved ?within the imaged cervical vertebral arteries. Paraspinal soft ?tissues unremarkable. ?  ?Disc levels: ?  ?Advanced disc degeneration at C4-C5. No more than mild disc ?degeneration at the remaining levels. ?  ?C2-C3: No significant disc herniation or spinal canal stenosis. ?Uncovertebral hypertrophy on the left with minimal relative left ?neural foraminal narrowing. ?  ?C3-C4: Shallow disc bulge. Right greater than left uncovertebral ?hypertrophy. No significant spinal canal stenosis. Mild right neural ?foraminal narrowing. The right vertebral artery is tortuous at this ?level and courses within the right neural foramen. ?  ?C4-C5: Posterior disc osteophyte complex with bilateral disc ?osteophyte ridge/uncinate hypertrophy. Mild spinal canal stenosis ?(without significant spinal cord mass effect). Caudal neural ?foraminal narrowing (moderate/severe right, severe left). ?  ?C5-C6: Shallow disc bulge. Right-sided disc osteophyte ?ridge/uncinate hypertrophy. Mild uncovertebral hypertrophy is also ?present on the left. No significant spinal canal  stenosis. Bilateral ?neural foraminal narrowing (moderate right, mild left). ?  ?C6-C7: No significant disc herniation or spinal canal stenosis. ?Uncovertebral hypertrophy on the right resulting in moderate right ?neural foraminal narrowing. ?  ?C7-T1: No significant disc herniation or stenosis. ?  ?IMPRESSION: ?Intermittently motion degraded examination. ?  ?Cervical spondylosis, as outlined and with findings most notably as ?follows. ?  ?At C4-C5, there is advanced disc degeneration and trace grade 1 ?retrolisthesis. Fairly prominent degenerative endplate sclerosis ?within the C4 and C5 vertebral bodies. Trace degenerative endplate ?edema is also present at this level. Posterior disc osteophyte ?complex with bilateral disc osteophyte ridge/uncinate hypertrophy. ?Mild spinal canal stenosis (without significant spinal cord mass ?effect). Bilateral neural foraminal narrowing (moderate/severe ?right, severe left). ?  ?No significant spinal canal stenosis at the remaining levels. ?Additional sites of foraminal stenosis, as detailed and greatest on ?the right at C5-C6 (moderate) and on the right at C6-C7 (moderate). ?  ?Trace degenerative endplate edema at D7-A1. ?  ?Nonspecific straightening of the expected cervical lordosis. ?  ?  ?Electronically Signed ?  By: Jackey Loge D.O. ?  On: 03/13/2021 08:09 ?   ? ? ? ?  PMFS History: ?Patient Active Problem List  ? Diagnosis Date Noted  ? Trigger thumb, right thumb 04/24/2021  ? Other spondylosis with radiculopathy, cervical region 04/24/2021  ? TIA (transient ischemic attack) 03/06/2021  ? Numbness and tingling in left arm 03/06/2021  ? Vesicles 12/04/2020  ? Itching in the vaginal area 12/04/2020  ? Hidradenitis 12/04/2020  ? Hematuria 12/04/2020  ? Suicidal ideation 11/29/2020  ? Hidradenitis suppurativa 10/31/2020  ? Carpal tunnel syndrome of right wrist 10/09/2020  ? Hyperthyroidism 09/17/2020  ? Mixed hyperlipidemia 09/17/2020  ? Routine general medical examination at a  health care facility 09/09/2020  ? DOE p PE Oct 2021 08/15/2020  ? Pulmonary emphysema (HCC) 07/18/2020  ? History of pulmonary embolus (PE) 07/18/2020  ? Anxiety with depression 07/18/2020  ? Eczema 07/18/2020  ? Hi

## 2021-04-24 NOTE — Addendum Note (Signed)
Addended by: Rogers Seeds on: 04/24/2021 03:04 PM ? ? Modules accepted: Orders ? ?

## 2021-04-28 ENCOUNTER — Ambulatory Visit (INDEPENDENT_AMBULATORY_CARE_PROVIDER_SITE_OTHER): Payer: Self-pay | Admitting: *Deleted

## 2021-04-28 VITALS — Ht 60.0 in | Wt 120.0 lb

## 2021-04-28 DIAGNOSIS — Z1211 Encounter for screening for malignant neoplasm of colon: Secondary | ICD-10-CM

## 2021-04-28 NOTE — Progress Notes (Signed)
Needs OV due to TIA.  ?

## 2021-04-28 NOTE — Progress Notes (Signed)
Scheduled ov for 05/08/2021 at 2:30 with Brooke Bonito, NP. ?

## 2021-04-28 NOTE — Progress Notes (Signed)
Gastroenterology Pre-Procedure Review ? ?Request Date: 04/28/2021 ?Requesting Physician: Dr. Trena Platt @ [facility], no previous TCS ? ?PATIENT REVIEW QUESTIONS: The patient responded to the following health history questions as indicated:   ? ?1. Diabetes Melitis: no ?2. Joint replacements in the past 12 months: no ?3. Major health problems in the past 3 months: yes, hypothyroidism, TIA Feb/Mar 2023 ?4. Has an artificial valve or MVP: no ?5. Has a defibrillator: no ?6. Has been advised in past to take antibiotics in advance of a procedure like teeth cleaning: no ?7. Family history of colon cancer: no  ?8. Alcohol Use: no ?9. Illicit drug Use: no ?10. History of sleep apnea: no  ?11. History of coronary artery or other vascular stents placed within the last 12 months: no ?12. History of any prior anesthesia complications: no ?13. Body mass index is 23.44 kg/m?. ?   ?MEDICATIONS & ALLERGIES:    ?Patient reports the following regarding taking any blood thinners:   ?Plavix? no ?Aspirin? no ?Coumadin? no ?Brilinta? no ?Xarelto? no ?Eliquis? no ?Pradaxa? no ?Savaysa? no ?Effient? no ? ?Patient confirms/reports the following medications:  ?Current Outpatient Medications  ?Medication Sig Dispense Refill  ? augmented betamethasone dipropionate (DIPROLENE-AF) 0.05 % cream Apply topically as needed.    ? clindamycin (CLEOCIN T) 1 % lotion Apply topically every other day.    ? clindamycin (CLINDAGEL) 1 % gel APPLY TOPICALLY TO THE AFFECTED AREA TWICE DAILY 30 g 1  ? escitalopram (LEXAPRO) 20 MG tablet TAKE 1 TABLET(20 MG) BY MOUTH DAILY 30 tablet 2  ? hydrOXYzine (VISTARIL) 50 MG capsule Take 1 capsule (50 mg total) by mouth 3 (three) times daily as needed. (Patient taking differently: Take 50 mg by mouth as needed.) 90 capsule 0  ? levothyroxine (SYNTHROID) 50 MCG tablet Take 1 tablet (50 mcg total) by mouth daily before breakfast. 90 tablet 1  ? mirtazapine (REMERON) 45 MG tablet Take 1 tablet (45 mg total) by  mouth at bedtime. 30 tablet 3  ? naproxen (NAPROSYN) 500 MG tablet Take 500 mg by mouth as needed.    ? progesterone (PROMETRIUM) 100 MG capsule Take 1 capsule (100 mg total) by mouth at bedtime. 30 capsule 2  ? triamcinolone cream (KENALOG) 0.1 % Apply 1 application topically 2 (two) times daily. 30 g 0  ? ?No current facility-administered medications for this visit.  ? ? ?Patient confirms/reports the following allergies:  ?Allergies  ?Allergen Reactions  ? Demerol Hives and Itching  ? ? ?No orders of the defined types were placed in this encounter. ? ? ?AUTHORIZATION INFORMATION ?Primary Insurance:  [employer],  ID #: [id number],  Group #: Individual [employer]Ex [id number] ?Pre-Cert / Berkley Harvey required: No, not required ? ?SCHEDULE INFORMATION: ?Procedure has been scheduled as follows:  ?Date: , Time:   ?Location: [facility] with Dr. Marletta Lor ? ?This Gastroenterology Pre-Precedure Review Form is being routed to the following provider(s): Ermalinda Memos, PA-C ?  ?

## 2021-04-29 ENCOUNTER — Encounter: Payer: Self-pay | Admitting: Internal Medicine

## 2021-04-29 NOTE — Telephone Encounter (Signed)
Changed to phone visit

## 2021-04-30 ENCOUNTER — Encounter: Payer: Self-pay | Admitting: Internal Medicine

## 2021-04-30 ENCOUNTER — Ambulatory Visit (INDEPENDENT_AMBULATORY_CARE_PROVIDER_SITE_OTHER): Payer: 59 | Admitting: Internal Medicine

## 2021-04-30 DIAGNOSIS — R61 Generalized hyperhidrosis: Secondary | ICD-10-CM

## 2021-04-30 DIAGNOSIS — F410 Panic disorder [episodic paroxysmal anxiety] without agoraphobia: Secondary | ICD-10-CM

## 2021-04-30 DIAGNOSIS — F418 Other specified anxiety disorders: Secondary | ICD-10-CM

## 2021-04-30 DIAGNOSIS — R232 Flushing: Secondary | ICD-10-CM | POA: Diagnosis not present

## 2021-04-30 MED ORDER — MIRTAZAPINE 30 MG PO TABS: ORAL_TABLET | ORAL | 0 refills | Status: DC

## 2021-04-30 MED ORDER — ALPRAZOLAM 0.5 MG PO TABS: 0.5000 mg | ORAL_TABLET | Freq: Two times a day (BID) | ORAL | 0 refills | Status: DC | PRN

## 2021-04-30 MED ORDER — MIRTAZAPINE 7.5 MG PO TABS: 7.5000 mg | ORAL_TABLET | Freq: Every day | ORAL | 0 refills | Status: DC

## 2021-04-30 NOTE — Assessment & Plan Note (Signed)
Could be due to Remeron and/or Lexapro ?Will try to taper off Remeron for now ?Continue progesterone for now ?

## 2021-04-30 NOTE — Patient Instructions (Signed)
Please take Remeron 30 mg once at nighttime for the next 2 weeks, and then half-tablet of it for another 2 weeks. You will start Remeron 7.5 mg after 4 weeks. ? ?Please take Xanax as needed for panic episodes. ? ?Please continue to take other medications as prescribed. ?

## 2021-04-30 NOTE — Assessment & Plan Note (Signed)
Added Xanax as needed for panic episodes ?

## 2021-04-30 NOTE — Assessment & Plan Note (Addendum)
Uncontrolled with Remeron 45 mg qHS and Vistaril 50 mg TID PRN ?On Lexapro 20 mg QD ?Had referred to psychiatry/BH therapy ?Will taper Remeron for now as she has severe night sweats, which could be due to Remeron ?Added Xanax as needed for panic disorder ?Current hypothyroidism can also lead to anhedonia and fatigue, followed by endocrinology ?

## 2021-04-30 NOTE — Progress Notes (Signed)
?  ? ?Virtual Visit via Telephone Note  ? ?This visit type was conducted due to national recommendations for restrictions regarding the COVID-19 Pandemic (e.g. social distancing) in an effort to limit this patient's exposure and mitigate transmission in our community.  Due to her co-morbid illnesses, this patient is at least at moderate risk for complications without adequate follow up.  This format is felt to be most appropriate for this patient at this time.  The patient did not have access to video technology/had technical difficulties with video requiring transitioning to audio format only (telephone).  All issues noted in this document were discussed and addressed.  No physical exam could be performed with this format. ? ?Evaluation Performed:  Follow-up visit ? ?Date:  04/30/2021  ? ?ID:  Kimberly Rocha, DOB 09-Jul-1975, MRN [id number] ? ?Patient Location: Home ?Provider Location: Office/Clinic ? ?Participants: Patient ?Location of Patient: Home ?Location of Provider: Telehealth ?Consent was obtain for visit to be over via telehealth. ?I verified that I am speaking with the correct person using two identifiers. ? ?PCP:  Anabel Halon, MD  ? ?Chief Complaint: Anxiety and night sweats ? ?History of Present Illness:   ? ?Kimberly Rocha is a 46 y.o. female with PMH of PE (11/2019), hyperthyroidism, seizure disorder, ?herpes simplex and anxiety with depression who has a televisit for complaint of anxiety and night sweats. ? ?She has been taking Lexapro and Remeron for anxiety and insomnia.  She still complains of anhedonia, episodes of severe anxiety/panic and tremors.  She has had panic episodes, where she feels short of breath and has severe flushing with palpitations.  She also complains of night sweats, for which she was given progesterone, but has not helped her yet.  She denies any SI or HI currently.  Of note, she has been taking levothyroxine for hypothyroidism now after RAI ablation of her thyroid. ? ?The  patient does not have symptoms concerning for COVID-19 infection (fever, chills, cough, or new shortness of breath).  ? ?Past Medical, Surgical, Social History, Allergies, and Medications have been Reviewed. ? ?Past Medical History:  ?Diagnosis Date  ? Borderline high cholesterol   ? Borderline hypertension   ? Gallstones   ? Thyroid disease   ? ?Past Surgical History:  ?Procedure Laterality Date  ? CESAREAN SECTION    ? CHOLECYSTECTOMY N/A 08/22/2012  ? Procedure: LAPAROSCOPIC CHOLECYSTECTOMY;  Surgeon: Dalia Heading, MD;  Location: [facility];  Service: General;  Laterality: N/A;  ? GALLBLADDER SURGERY    ? HERNIA REPAIR    ? TUBAL LIGATION    ?  ? ?Current Meds  ?Medication Sig  ? ALPRAZolam (XANAX) 0.5 MG tablet Take 1 tablet (0.5 mg total) by mouth 2 (two) times daily as needed for anxiety.  ? augmented betamethasone dipropionate (DIPROLENE-AF) 0.05 % cream Apply topically as needed.  ? clindamycin (CLEOCIN T) 1 % lotion Apply topically every other day.  ? clindamycin (CLINDAGEL) 1 % gel APPLY TOPICALLY TO THE AFFECTED AREA TWICE DAILY  ? escitalopram (LEXAPRO) 20 MG tablet TAKE 1 TABLET(20 MG) BY MOUTH DAILY  ? hydrOXYzine (VISTARIL) 50 MG capsule Take 1 capsule (50 mg total) by mouth 3 (three) times daily as needed. (Patient taking differently: Take 50 mg by mouth as needed.)  ? levothyroxine (SYNTHROID) 50 MCG tablet Take 1 tablet (50 mcg total) by mouth daily before breakfast.  ? mirtazapine (REMERON) 30 MG tablet Please take 1 tablet once at nighttime for 2 weeks, then take 1/2 tablet once  at nighttime for 2 weeks.  ? [START ON 05/26/2021] mirtazapine (REMERON) 7.5 MG tablet Take 1 tablet (7.5 mg total) by mouth at bedtime.  ? naproxen (NAPROSYN) 500 MG tablet Take 500 mg by mouth as needed.  ? progesterone (PROMETRIUM) 100 MG capsule Take 1 capsule (100 mg total) by mouth at bedtime.  ? triamcinolone cream (KENALOG) 0.1 % Apply 1 application topically 2 (two) times daily.  ? [DISCONTINUED] mirtazapine  (REMERON) 45 MG tablet Take 1 tablet (45 mg total) by mouth at bedtime.  ?  ? ?Allergies:   Demerol  ? ?ROS:   ?Please see the history of present illness.    ? ?All other systems reviewed and are negative. ? ? ?Labs/Other Tests and Data Reviewed:   ? ?Recent Labs: ?07/25/2020: ALT 8; BUN 7; Creatinine, Ser 0.83; Potassium 4.5; Sodium 143 ?08/29/2020: Hemoglobin 12.3; Platelets 209 ?03/26/2021: TSH 12.100  ? ?Recent Lipid Panel ?Lab Results  ?Component Value Date/Time  ? CHOL 233 (H) 07/25/2020 10:29 AM  ? TRIG 63 07/25/2020 10:29 AM  ? HDL 47 07/25/2020 10:29 AM  ? CHOLHDL 5.0 (H) 07/25/2020 10:29 AM  ? LDLCALC 175 (H) 07/25/2020 10:29 AM  ? ? ?Wt Readings from Last 3 Encounters:  ?04/28/21 120 lb (54.4 kg)  ?04/24/21 120 lb (54.4 kg)  ?04/22/21 120 lb (54.4 kg)  ?  ? ?ASSESSMENT & PLAN:   ? ?Anxiety with depression ?Uncontrolled with Remeron 45 mg qHS and Vistaril 50 mg TID PRN ?On Lexapro 20 mg QD ?Had referred to psychiatry/BH therapy ?Will taper Remeron for now as she has severe night sweats, which could be due to Remeron ?Added Xanax as needed for panic disorder ?Current hypothyroidism can also lead to anhedonia and fatigue, followed by endocrinology ? ?Night sweats ?Could be due to Remeron and/or Lexapro ?Will try to taper off Remeron for now ?Continue progesterone for now ? ?Panic disorder ?Added Xanax as needed for panic episodes ? ? ?Time:   ?Today, I have spent 17 minutes reviewing the chart, including problem list, medications, and with the patient with telehealth technology discussing the above problems. ? ? ?Medication Adjustments/Labs and Tests Ordered: ?Current medicines are reviewed at length with the patient today.  Concerns regarding medicines are outlined above.  ? ?Tests Ordered: ?No orders of the defined types were placed in this encounter. ? ? ?Medication Changes: ?Meds ordered this encounter  ?Medications  ? mirtazapine (REMERON) 30 MG tablet  ?  Sig: Please take 1 tablet once at nighttime for 2  weeks, then take 1/2 tablet once at nighttime for 2 weeks.  ?  Dispense:  21 tablet  ?  Refill:  0  ?  Dose decrease - 04/05  ? mirtazapine (REMERON) 7.5 MG tablet  ?  Sig: Take 1 tablet (7.5 mg total) by mouth at bedtime.  ?  Dispense:  14 tablet  ?  Refill:  0  ? ALPRAZolam (XANAX) 0.5 MG tablet  ?  Sig: Take 1 tablet (0.5 mg total) by mouth 2 (two) times daily as needed for anxiety.  ?  Dispense:  30 tablet  ?  Refill:  0  ? ? ? ?Note: This dictation was prepared with Dragon dictation along with smaller phrase technology. Similar sounding words can be transcribed inadequately or may not be corrected upon review. Any transcriptional errors that result from this process are unintentional.  ?  ? ? ?Disposition:  Follow up  ?Signed, ?Anabel Halon, MD  ?04/30/2021 3:18 PM    ? ?  [facility] ?[facility] ?

## 2021-05-01 ENCOUNTER — Ambulatory Visit: Payer: 59 | Admitting: Adult Health

## 2021-05-07 NOTE — Progress Notes (Deleted)
? ? ?GI Office Note   ? ?Referring Provider: Anabel Halon, MD ?Primary Care Physician:  Anabel Halon, MD  ? ? ?Chief Complaint  ? ?No chief complaint on file. ? ? ? ?History of Present Illness  ? ?ALLSION NOGALES is a 46 y.o. female presenting today at the request of Anabel Halon, MD for *** ? ? ? ? ? ? ?Past Medical History:  ?Diagnosis Date  ? Borderline high cholesterol   ? Borderline hypertension   ? Gallstones   ? Thyroid disease   ? ? ?Past Surgical History:  ?Procedure Laterality Date  ? CESAREAN SECTION    ? CHOLECYSTECTOMY N/A 08/22/2012  ? Procedure: LAPAROSCOPIC CHOLECYSTECTOMY;  Surgeon: Dalia Heading, MD;  Location: [facility];  Service: General;  Laterality: N/A;  ? GALLBLADDER SURGERY    ? HERNIA REPAIR    ? TUBAL LIGATION    ? ? ?Current Outpatient Medications  ?Medication Sig Dispense Refill  ? ALPRAZolam (XANAX) 0.5 MG tablet Take 1 tablet (0.5 mg total) by mouth 2 (two) times daily as needed for anxiety. 30 tablet 0  ? augmented betamethasone dipropionate (DIPROLENE-AF) 0.05 % cream Apply topically as needed.    ? clindamycin (CLEOCIN T) 1 % lotion Apply topically every other day.    ? clindamycin (CLINDAGEL) 1 % gel APPLY TOPICALLY TO THE AFFECTED AREA TWICE DAILY 30 g 1  ? escitalopram (LEXAPRO) 20 MG tablet TAKE 1 TABLET(20 MG) BY MOUTH DAILY 30 tablet 2  ? hydrOXYzine (VISTARIL) 50 MG capsule Take 1 capsule (50 mg total) by mouth 3 (three) times daily as needed. (Patient taking differently: Take 50 mg by mouth as needed.) 90 capsule 0  ? levothyroxine (SYNTHROID) 50 MCG tablet Take 1 tablet (50 mcg total) by mouth daily before breakfast. 90 tablet 1  ? mirtazapine (REMERON) 30 MG tablet Please take 1 tablet once at nighttime for 2 weeks, then take 1/2 tablet once at nighttime for 2 weeks. 21 tablet 0  ? [START ON 05/26/2021] mirtazapine (REMERON) 7.5 MG tablet Take 1 tablet (7.5 mg total) by mouth at bedtime. 14 tablet 0  ? naproxen (NAPROSYN) 500 MG tablet Take 500 mg by mouth as needed.     ? progesterone (PROMETRIUM) 100 MG capsule Take 1 capsule (100 mg total) by mouth at bedtime. 30 capsule 2  ? triamcinolone cream (KENALOG) 0.1 % Apply 1 application topically 2 (two) times daily. 30 g 0  ? ?No current facility-administered medications for this visit.  ? ? ?Allergies as of 05/08/2021 - Review Complete 04/30/2021  ?Allergen Reaction Noted  ? Demerol Hives and Itching 10/13/2010  ? ? ?Family History  ?Problem Relation Age of Onset  ? COPD Mother   ? COPD Father   ? HIV/AIDS Brother   ? Stroke Maternal Grandmother   ? Other Daughter   ?     has issues with cervix  ? ? ?Social History  ? ?Socioeconomic History  ? Marital status: Married  ?  Spouse name: Not on file  ? Number of children: Not on file  ? Years of education: Not on file  ? Highest education level: Not on file  ?Occupational History  ? Not on file  ?Tobacco Use  ? Smoking status: Former  ?  Packs/day: 0.50  ?  Types: Cigarettes  ? Smokeless tobacco: Never  ?Vaping Use  ? Vaping Use: Former  ?Substance and Sexual Activity  ? Alcohol use: Yes  ?  Comment: occ  ?  Drug use: Yes  ?  Frequency: 3.0 times per week  ?  Types: Marijuana  ?  Comment: twice a day  ? Sexual activity: Yes  ?  Birth control/protection: Surgical  ?  Comment: tubal  ?Other Topics Concern  ? Not on file  ?Social History Narrative  ? Not on file  ? ?Social Determinants of Health  ? ?Financial Resource Strain: Medium Risk  ? Difficulty of Paying Living Expenses: Somewhat hard  ?Food Insecurity: Food Insecurity Present  ? Worried About Programme researcher, broadcasting/film/video in the Last Year: Sometimes true  ? Ran Out of Food in the Last Year: Sometimes true  ?Transportation Needs: No Transportation Needs  ? Lack of Transportation (Medical): No  ? Lack of Transportation (Non-Medical): No  ?Physical Activity: Insufficiently Active  ? Days of Exercise per Week: 2 days  ? Minutes of Exercise per Session: 20 min  ?Stress: Stress Concern Present  ? Feeling of Stress : Very much  ?Social Connections:  Moderately Isolated  ? Frequency of Communication with Friends and Family: More than three times a week  ? Frequency of Social Gatherings with Friends and Family: Twice a week  ? Attends Religious Services: More than 4 times per year  ? Active Member of Clubs or Organizations: No  ? Attends Banker Meetings: Never  ? Marital Status: Never married  ?Intimate Partner Violence: Not At Risk  ? Fear of Current or Ex-Partner: No  ? Emotionally Abused: No  ? Physically Abused: No  ? Sexually Abused: No  ? ? ? ?Review of Systems  ? ?Gen: Denies any fever, chills, fatigue, weight loss, lack of appetite.  ?CV: Denies chest pain, heart palpitations, peripheral edema, syncope.  ?Resp: Denies shortness of breath at rest or with exertion. Denies wheezing or cough.  ?GI: Denies dysphagia or odynophagia. Denies jaundice, hematemesis, fecal incontinence. ?GU : Denies urinary burning, urinary frequency, urinary hesitancy ?MS: Denies joint pain, muscle weakness, cramps, or limitation of movement.  ?Derm: Denies rash, itching, dry skin ?Psych: Denies depression, anxiety, memory loss, and confusion ?Heme: Denies bruising, bleeding, and enlarged lymph nodes. ? ? ?Physical Exam  ? ?There were no vitals taken for this visit. ?General:   Alert and oriented. Pleasant and cooperative. Well-nourished and well-developed.  ?Head:  Normocephalic and atraumatic. ?Eyes:  Without icterus, sclera clear and conjunctiva pink.  ?Ears:  Normal auditory acuity. ?Mouth:  No deformity or lesions, oral mucosa pink.  ?Lungs:  Clear to auscultation bilaterally. No wheezes, rales, or rhonchi. No distress.  ?Heart:  S1, S2 present without murmurs appreciated.  ?Abdomen:  +BS, soft, non-tender and non-distended. No HSM noted. No guarding or rebound. No masses appreciated.  ?Rectal:  Deferred  ?Msk:  Symmetrical without gross deformities. Normal posture. ?Extremities:  Without edema. ?Neurologic:  Alert and  oriented x4;  grossly normal  neurologically. ?Skin:  Intact without significant lesions or rashes. ?Psych:  Alert and cooperative. Normal mood and affect. ? ? ?Assessment  ? ?CHARLAYNE VULTAGGIO is a 46 y.o. female with a history of *** presenting today with  ? ? ?PLAN  ? ?***** ? ? ? ?Brooke Bonito, MSN, FNP-BC, AGACNP-BC ?[facility] ?

## 2021-05-08 ENCOUNTER — Ambulatory Visit: Payer: 59 | Admitting: Gastroenterology

## 2021-05-08 ENCOUNTER — Ambulatory Visit: Payer: 59

## 2021-05-11 ENCOUNTER — Other Ambulatory Visit: Payer: Self-pay | Admitting: Internal Medicine

## 2021-05-11 DIAGNOSIS — F418 Other specified anxiety disorders: Secondary | ICD-10-CM

## 2021-05-12 ENCOUNTER — Encounter: Payer: Self-pay | Admitting: Internal Medicine

## 2021-05-13 ENCOUNTER — Ambulatory Visit (INDEPENDENT_AMBULATORY_CARE_PROVIDER_SITE_OTHER): Payer: 59 | Admitting: Internal Medicine

## 2021-05-13 ENCOUNTER — Encounter: Payer: Self-pay | Admitting: Internal Medicine

## 2021-05-13 DIAGNOSIS — L219 Seborrheic dermatitis, unspecified: Secondary | ICD-10-CM

## 2021-05-13 DIAGNOSIS — R61 Generalized hyperhidrosis: Secondary | ICD-10-CM

## 2021-05-13 MED ORDER — KETOCONAZOLE 2 % EX SHAM: 1.0000 "application " | MEDICATED_SHAMPOO | CUTANEOUS | 0 refills | Status: DC

## 2021-05-13 NOTE — Assessment & Plan Note (Signed)
Could be due to Remeron and/or Lexapro ?Now better ?Will continue to taper off Remeron for now ?Continue progesterone for now ?

## 2021-05-13 NOTE — Progress Notes (Signed)
?  ? ?Virtual Visit via Telephone Note  ? ?This visit type was conducted due to national recommendations for restrictions regarding the COVID-19 Pandemic (e.g. social distancing) in an effort to limit this patient's exposure and mitigate transmission in our community.  Due to her co-morbid illnesses, this patient is at least at moderate risk for complications without adequate follow up.  This format is felt to be most appropriate for this patient at this time.  The patient did not have access to video technology/had technical difficulties with video requiring transitioning to audio format only (telephone).  All issues noted in this document were discussed and addressed.  No physical exam could be performed with this format. ? ?Evaluation Performed:  Follow-up visit ? ?Date:  05/13/2021  ? ?ID:  Kimberly Rocha, DOB 08/01/1975, MRN [id number] ? ?Patient Location: Home ?Provider Location: Office/Clinic ? ?Participants: Patient ?Location of Patient: Home ?Location of Provider: Telehealth ?Consent was obtain for visit to be over via telehealth. ?I verified that I am speaking with the correct person using two identifiers. ? ?PCP:  Anabel Halon, MD  ? ?Chief Complaint: Scalp rash ? ?History of Present Illness:   ? ?Kimberly Rocha is a 46 y.o. female who has a televisit for complaint of scalp rash and itching.  She tried to shave her hair, and noticed scalp rash, images reviewed on MyChart.  She has tried changing her shampoo to help with itching.  Denies any rash but itching over the face area.  Denies any white flaking currently. ? ?The patient does not have symptoms concerning for COVID-19 infection (fever, chills, cough, or new shortness of breath).  ? ?Past Medical, Surgical, Social History, Allergies, and Medications have been Reviewed. ? ?Past Medical History:  ?Diagnosis Date  ? Borderline high cholesterol   ? Borderline hypertension   ? Gallstones   ? Thyroid disease   ? ?Past Surgical History:  ?Procedure  Laterality Date  ? CESAREAN SECTION    ? CHOLECYSTECTOMY N/A 08/22/2012  ? Procedure: LAPAROSCOPIC CHOLECYSTECTOMY;  Surgeon: Dalia Heading, MD;  Location: [facility];  Service: General;  Laterality: N/A;  ? GALLBLADDER SURGERY    ? HERNIA REPAIR    ? TUBAL LIGATION    ?  ? ?Current Meds  ?Medication Sig  ? ALPRAZolam (XANAX) 0.5 MG tablet Take 1 tablet (0.5 mg total) by mouth 2 (two) times daily as needed for anxiety.  ? augmented betamethasone dipropionate (DIPROLENE-AF) 0.05 % cream Apply topically as needed.  ? clindamycin (CLEOCIN T) 1 % lotion Apply topically every other day.  ? clindamycin (CLINDAGEL) 1 % gel APPLY TOPICALLY TO THE AFFECTED AREA TWICE DAILY  ? escitalopram (LEXAPRO) 20 MG tablet TAKE 1 TABLET(20 MG) BY MOUTH DAILY  ? hydrOXYzine (VISTARIL) 50 MG capsule Take 1 capsule (50 mg total) by mouth 3 (three) times daily as needed. (Patient taking differently: Take 50 mg by mouth as needed.)  ? [START ON 05/15/2021] ketoconazole (NIZORAL) 2 % shampoo Apply 1 application. topically 2 (two) times a week.  ? levothyroxine (SYNTHROID) 50 MCG tablet Take 1 tablet (50 mcg total) by mouth daily before breakfast.  ? mirtazapine (REMERON) 30 MG tablet Please take 1 tablet once at nighttime for 2 weeks, then take 1/2 tablet once at nighttime for 2 weeks.  ? [START ON 05/26/2021] mirtazapine (REMERON) 7.5 MG tablet Take 1 tablet (7.5 mg total) by mouth at bedtime.  ? naproxen (NAPROSYN) 500 MG tablet Take 500 mg by mouth as needed.  ?  progesterone (PROMETRIUM) 100 MG capsule Take 1 capsule (100 mg total) by mouth at bedtime.  ? triamcinolone cream (KENALOG) 0.1 % Apply 1 application topically 2 (two) times daily.  ?  ? ?Allergies:   Demerol  ? ?ROS:   ?Please see the history of present illness.    ? ?All other systems reviewed and are negative. ? ? ?Labs/Other Tests and Data Reviewed:   ? ?Recent Labs: ?07/25/2020: ALT 8; BUN 7; Creatinine, Ser 0.83; Potassium 4.5; Sodium 143 ?08/29/2020: Hemoglobin 12.3; Platelets  209 ?03/26/2021: TSH 12.100  ? ?Recent Lipid Panel ?Lab Results  ?Component Value Date/Time  ? CHOL 233 (H) 07/25/2020 10:29 AM  ? TRIG 63 07/25/2020 10:29 AM  ? HDL 47 07/25/2020 10:29 AM  ? CHOLHDL 5.0 (H) 07/25/2020 10:29 AM  ? LDLCALC 175 (H) 07/25/2020 10:29 AM  ? ? ?Wt Readings from Last 3 Encounters:  ?04/28/21 120 lb (54.4 kg)  ?04/24/21 120 lb (54.4 kg)  ?04/22/21 120 lb (54.4 kg)  ?  ? ?ASSESSMENT & PLAN:   ? ?Seborrheic dermatitis ?Scalp rash likely seborrheic dermatitis ?Started ketoconazole shampoo ?Advised to contact dermatology if she has persistent symptoms ? ?Night sweats ?Could be due to Remeron and/or Lexapro ?Now better ?Will continue to taper off Remeron for now ?Continue progesterone for now ? ? ?Time:   ?Today, I have spent 11 minutes reviewing the chart, including problem list, medications, and with the patient with telehealth technology discussing the above problems. ? ? ?Medication Adjustments/Labs and Tests Ordered: ?Current medicines are reviewed at length with the patient today.  Concerns regarding medicines are outlined above.  ? ?Tests Ordered: ?No orders of the defined types were placed in this encounter. ? ? ?Medication Changes: ?Meds ordered this encounter  ?Medications  ? ketoconazole (NIZORAL) 2 % shampoo  ?  Sig: Apply 1 application. topically 2 (two) times a week.  ?  Dispense:  120 mL  ?  Refill:  0  ? ? ? ?Note: This dictation was prepared with Dragon dictation along with smaller phrase technology. Similar sounding words can be transcribed inadequately or may not be corrected upon review. Any transcriptional errors that result from this process are unintentional.  ?  ? ? ?Disposition:  Follow up  ?Signed, ?Anabel Halon, MD  ?05/13/2021 12:15 PM    ? ?[facility] ?[facility] ?

## 2021-05-13 NOTE — Assessment & Plan Note (Signed)
Scalp rash likely seborrheic dermatitis ?Started ketoconazole shampoo ?Advised to contact dermatology if she has persistent symptoms ?

## 2021-05-20 ENCOUNTER — Encounter: Payer: Self-pay | Admitting: Orthopaedic Surgery

## 2021-05-20 ENCOUNTER — Ambulatory Visit: Payer: 59 | Admitting: Orthopaedic Surgery

## 2021-05-23 ENCOUNTER — Encounter: Payer: Self-pay | Admitting: Internal Medicine

## 2021-05-26 ENCOUNTER — Other Ambulatory Visit: Payer: Self-pay | Admitting: Internal Medicine

## 2021-05-26 DIAGNOSIS — J439 Emphysema, unspecified: Secondary | ICD-10-CM

## 2021-05-26 DIAGNOSIS — F410 Panic disorder [episodic paroxysmal anxiety] without agoraphobia: Secondary | ICD-10-CM

## 2021-05-29 ENCOUNTER — Ambulatory Visit: Payer: 59 | Admitting: Orthopaedic Surgery

## 2021-05-29 LAB — T4, FREE: Free T4: 1.24 ng/dL (ref 0.82–1.77)

## 2021-05-29 LAB — TSH: TSH: 6.57 u[IU]/mL — ABNORMAL HIGH (ref 0.450–4.500)

## 2021-06-02 ENCOUNTER — Encounter: Payer: Self-pay | Admitting: Nurse Practitioner

## 2021-06-02 ENCOUNTER — Ambulatory Visit (INDEPENDENT_AMBULATORY_CARE_PROVIDER_SITE_OTHER): Payer: 59 | Admitting: Nurse Practitioner

## 2021-06-02 ENCOUNTER — Other Ambulatory Visit: Payer: Self-pay | Admitting: Internal Medicine

## 2021-06-02 VITALS — BP 134/82 | HR 94 | Ht 60.0 in | Wt 119.0 lb

## 2021-06-02 DIAGNOSIS — L219 Seborrheic dermatitis, unspecified: Secondary | ICD-10-CM

## 2021-06-02 DIAGNOSIS — E890 Postprocedural hypothyroidism: Secondary | ICD-10-CM | POA: Diagnosis not present

## 2021-06-02 MED ORDER — LEVOTHYROXINE SODIUM 75 MCG PO TABS: 75.0000 ug | ORAL_TABLET | Freq: Every day | ORAL | 1 refills | Status: DC

## 2021-06-02 NOTE — Progress Notes (Signed)
? ? ? 06/02/2021   ? ? ?Endocrinology Follow Up Note  ? ? ?Subjective:  ? ? Patient ID: Kimberly Rocha, female    DOB: 27-Sep-1975, PCP Anabel Halon, MD. ? ? ?Past Medical History:  ?Diagnosis Date  ? Borderline high cholesterol   ? Borderline hypertension   ? Gallstones   ? Thyroid disease   ? ? ?Past Surgical History:  ?Procedure Laterality Date  ? CESAREAN SECTION    ? CHOLECYSTECTOMY N/A 08/22/2012  ? Procedure: LAPAROSCOPIC CHOLECYSTECTOMY;  Surgeon: Dalia Heading, MD;  Location: [facility];  Service: General;  Laterality: N/A;  ? GALLBLADDER SURGERY    ? HERNIA REPAIR    ? TUBAL LIGATION    ? ? ?Social History  ? ?Socioeconomic History  ? Marital status: Married  ?  Spouse name: Not on file  ? Number of children: Not on file  ? Years of education: Not on file  ? Highest education level: Not on file  ?Occupational History  ? Not on file  ?Tobacco Use  ? Smoking status: Former  ?  Packs/day: 0.50  ?  Types: Cigarettes  ? Smokeless tobacco: Never  ?Vaping Use  ? Vaping Use: Former  ?Substance and Sexual Activity  ? Alcohol use: Yes  ?  Comment: occ  ? Drug use: Yes  ?  Frequency: 3.0 times per week  ?  Types: Marijuana  ?  Comment: twice a day  ? Sexual activity: Yes  ?  Birth control/protection: Surgical  ?  Comment: tubal  ?Other Topics Concern  ? Not on file  ?Social History Narrative  ? Not on file  ? ?Social Determinants of Health  ? ?Financial Resource Strain: Medium Risk  ? Difficulty of Paying Living Expenses: Somewhat hard  ?Food Insecurity: Food Insecurity Present  ? Worried About Programme researcher, broadcasting/film/video in the Last Year: Sometimes true  ? Ran Out of Food in the Last Year: Sometimes true  ?Transportation Needs: No Transportation Needs  ? Lack of Transportation (Medical): No  ? Lack of Transportation (Non-Medical): No  ?Physical Activity: Insufficiently Active  ? Days of Exercise per Week: 2 days  ? Minutes of Exercise per Session: 20 min  ?Stress: Stress Concern Present  ? Feeling of Stress : Very much   ?Social Connections: Moderately Isolated  ? Frequency of Communication with Friends and Family: More than three times a week  ? Frequency of Social Gatherings with Friends and Family: Twice a week  ? Attends Religious Services: More than 4 times per year  ? Active Member of Clubs or Organizations: No  ? Attends Banker Meetings: Never  ? Marital Status: Never married  ? ? ?Family History  ?Problem Relation Age of Onset  ? COPD Mother   ? COPD Father   ? HIV/AIDS Brother   ? Stroke Maternal Grandmother   ? Other Daughter   ?     has issues with cervix  ? ? ?Outpatient Encounter Medications as of 06/02/2021  ?Medication Sig  ? ALPRAZolam (XANAX) 0.5 MG tablet TAKE 1 TABLET(0.5 MG) BY MOUTH TWICE DAILY AS NEEDED FOR ANXIETY  ? augmented betamethasone dipropionate (DIPROLENE-AF) 0.05 % cream Apply topically as needed.  ? clindamycin (CLEOCIN T) 1 % lotion Apply topically every other day.  ? clindamycin (CLINDAGEL) 1 % gel APPLY TOPICALLY TO THE AFFECTED AREA TWICE DAILY  ? escitalopram (LEXAPRO) 20 MG tablet TAKE 1 TABLET(20 MG) BY MOUTH DAILY  ? hydrOXYzine (VISTARIL) 50 MG capsule  Take 1 capsule (50 mg total) by mouth 3 (three) times daily as needed. (Patient taking differently: Take 50 mg by mouth as needed.)  ? ketoconazole (NIZORAL) 2 % shampoo APPLY TOPICALLY 2 TIMES A WEEK  ? levothyroxine (SYNTHROID) 75 MCG tablet Take 1 tablet (75 mcg total) by mouth daily before breakfast.  ? naproxen (NAPROSYN) 500 MG tablet Take 500 mg by mouth as needed.  ? progesterone (PROMETRIUM) 100 MG capsule Take 1 capsule (100 mg total) by mouth at bedtime.  ? triamcinolone cream (KENALOG) 0.1 % Apply 1 application topically 2 (two) times daily.  ? [DISCONTINUED] ketoconazole (NIZORAL) 2 % shampoo Apply 1 application. topically 2 (two) times a week.  ? [DISCONTINUED] levothyroxine (SYNTHROID) 50 MCG tablet Take 1 tablet (50 mcg total) by mouth daily before breakfast.  ? [DISCONTINUED] mirtazapine (REMERON) 30 MG tablet  Please take 1 tablet once at nighttime for 2 weeks, then take 1/2 tablet once at nighttime for 2 weeks.  ? [DISCONTINUED] mirtazapine (REMERON) 7.5 MG tablet Take 1 tablet (7.5 mg total) by mouth at bedtime.  ? ?No facility-administered encounter medications on file as of 06/02/2021.  ? ? ?ALLERGIES: ?Allergies  ?Allergen Reactions  ? Demerol Hives and Itching  ? ? ?VACCINATION STATUS: ?Immunization History  ?Administered Date(s) Administered  ? Moderna Sars-Covid-2 Vaccination 02/27/2020, 03/27/2020  ? ? ? ?HPI ? ?Kimberly BruntLatoye A Rocha is 46 y.o. female who presents today with a medical history as above. she is being seen in follow up after being seen in consultation for hyperthyroidism requested by Anabel HalonPatel, Rutwik K, MD.    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  Treatment was successful and she has now been started on thyroid hormone replacement. ? ?she denies dysphagia, choking, no recent voice change.  ?  ?she denies family history of thyroid dysfunction and denies family hx of thyroid cancer. she denies personal history of goiter. she is not on any thyroid hormone supplements, has been taking antithyroid medication for approximately 30 days. Denies use of Biotin containing supplements.  she is willing to proceed with appropriate work up and therapy for thyrotoxicosis. ? ? ?Review of systems ? ?Constitutional: + Minimally fluctuating body weight,  current Body mass index is 23.24 kg/m?. , + fatigue, no subjective hyperthermia, no subjective hypothermia ?Eyes: no blurry vision, no xerophthalmia ?ENT: + sore throat-recently started about 2 weeks ago, no nodules palpated in throat, no dysphagia/odynophagia, + hoarseness, + dry mouth ?Cardiovascular: no chest pain, no shortness of breath, no palpitations, no leg swelling ?Respiratory: no cough, no shortness of breath ?Gastrointestinal: no nausea/vomiting/diarrhea ?Musculoskeletal: no muscle/joint aches ?Skin: + diffuse rash- has seen  dermatology in the past, has a second opinion coming up in the next few months, no hyperemia ?Neurological: no tremors, no numbness, no tingling, no dizziness ?Psychiatric: + patient notes mild depression, no anxiety ? ? ?Objective:  ?  ?BP 134/82   Pulse 94   Ht 5' (1.524 m)   Wt 119 lb (54 kg)   BMI 23.24 kg/m?   ?Wt Readings from Last 3 Encounters:  ?06/02/21 119 lb (54 kg)  ?04/28/21 120 lb (54.4 kg)  ?04/24/21 120 lb (54.4 kg)  ?  ? ?BP Readings from Last 3 Encounters:  ?06/02/21 134/82  ?04/22/21 133/79  ?03/31/21 115/78  ? ?                    ? ?Physical Exam- Limited ? ?Constitutional:  Body mass index is 23.24  kg/m?. , not in acute distress, normal state of mind ?Eyes:  EOMI, no exophthalmos ?Neck: Supple ?Respiratory: Adequate breathing efforts ?Musculoskeletal: no gross deformities, strength intact in all four extremities, no gross restriction of joint movements ?Skin:  no rashes, no hyperemia ?Neurological: no tremor with outstretched hands ? ? ?CMP  ?   ?Component Value Date/Time  ? NA 143 07/25/2020 1029  ? K 4.5 07/25/2020 1029  ? CL 98 07/25/2020 1029  ? CO2 23 07/25/2020 1029  ? GLUCOSE 109 (H) 07/25/2020 1029  ? GLUCOSE 104 (H) 04/21/2020 1055  ? BUN 7 07/25/2020 1029  ? CREATININE 0.83 07/25/2020 1029  ? CALCIUM 9.6 08/29/2020 1000  ? PROT 7.1 07/25/2020 1029  ? ALBUMIN 4.5 07/25/2020 1029  ? AST 11 07/25/2020 1029  ? ALT 8 07/25/2020 1029  ? ALKPHOS 79 07/25/2020 1029  ? BILITOT 0.3 07/25/2020 1029  ? GFRNONAA >60 04/21/2020 1055  ? GFRAA >60 10/17/2018 1545  ? ? ? ?CBC ?   ?Component Value Date/Time  ? WBC 16.5 (H) 08/29/2020 1000  ? WBC 8.8 04/21/2020 1055  ? RBC 3.69 (L) 08/29/2020 1000  ? RBC 4.01 04/21/2020 1055  ? HGB 12.3 08/29/2020 1000  ? HCT 36.1 08/29/2020 1000  ? PLT 209 08/29/2020 1000  ? MCV 98 (H) 08/29/2020 1000  ? MCH 33.3 (H) 08/29/2020 1000  ? MCH 33.9 04/21/2020 1055  ? MCHC 34.1 08/29/2020 1000  ? MCHC 35.1 04/21/2020 1055  ? RDW 13.1 08/29/2020 1000  ? LYMPHSABS 3.0  08/29/2020 1000  ? MONOABS 0.6 04/21/2020 1055  ? EOSABS 0.0 08/29/2020 1000  ? BASOSABS 0.0 08/29/2020 1000  ? ? ? ?Diabetic Labs (most recent): ?No results found for: HGBA1C ? ?Lipid Panel  ?   ?Component Value Date/Ti

## 2021-06-02 NOTE — Patient Instructions (Signed)

## 2021-06-04 ENCOUNTER — Encounter: Payer: Self-pay | Admitting: Orthopaedic Surgery

## 2021-06-04 ENCOUNTER — Ambulatory Visit (INDEPENDENT_AMBULATORY_CARE_PROVIDER_SITE_OTHER): Payer: 59 | Admitting: Orthopaedic Surgery

## 2021-06-04 DIAGNOSIS — M65311 Trigger thumb, right thumb: Secondary | ICD-10-CM | POA: Diagnosis not present

## 2021-06-04 DIAGNOSIS — M4722 Other spondylosis with radiculopathy, cervical region: Secondary | ICD-10-CM | POA: Diagnosis not present

## 2021-06-04 MED ORDER — HYDROCODONE-ACETAMINOPHEN 5-325 MG PO TABS: ORAL_TABLET | ORAL | 0 refills | Status: DC

## 2021-06-04 NOTE — Progress Notes (Signed)
My thumb is better ? ?Her left thumb is not triggering after the injection.  She saw Dr. Lorin Mercy for her neck and she has been to PT and is improving.  She has no new trauma.  She has some paresthesias on the left arm at times. ? ?Left thumb has full ROM, no pain, no triggering. ? ?Neck has near full ROM.  NV intact. ? ?Encounter Diagnoses  ?Name Primary?  ? Trigger thumb, right thumb Yes  ? Other spondylosis with radiculopathy, cervical region   ? ?Continue PT. ? ?I have reviewed the [facility] web site prior to prescribing narcotic medicine for this patient. ? ?I will see in one month. ? ?Call if any problem. ? ?Precautions discussed. ? ?Electronically Signed ?Sanjuana Kava, MD ?5/10/202311:06 AM ? ?

## 2021-06-04 NOTE — Progress Notes (Signed)
T

## 2021-06-05 ENCOUNTER — Ambulatory Visit (HOSPITAL_COMMUNITY): Payer: 59 | Attending: Orthopaedic Surgery | Admitting: Physical Therapy

## 2021-06-19 ENCOUNTER — Other Ambulatory Visit: Payer: Self-pay | Admitting: Adult Health

## 2021-06-19 ENCOUNTER — Other Ambulatory Visit: Payer: Self-pay | Admitting: Internal Medicine

## 2021-06-19 DIAGNOSIS — F418 Other specified anxiety disorders: Secondary | ICD-10-CM

## 2021-07-02 ENCOUNTER — Ambulatory Visit: Payer: 59 | Admitting: Orthopaedic Surgery

## 2021-07-04 ENCOUNTER — Encounter: Payer: Self-pay | Admitting: Internal Medicine

## 2021-07-07 ENCOUNTER — Other Ambulatory Visit: Payer: Self-pay

## 2021-07-07 DIAGNOSIS — F418 Other specified anxiety disorders: Secondary | ICD-10-CM

## 2021-07-07 MED ORDER — HYDROXYZINE PAMOATE 50 MG PO CAPS: 50.0000 mg | ORAL_CAPSULE | Freq: Three times a day (TID) | ORAL | 0 refills | Status: DC | PRN

## 2021-07-08 ENCOUNTER — Encounter: Payer: Self-pay | Admitting: Physician Assistant

## 2021-07-08 ENCOUNTER — Ambulatory Visit (INDEPENDENT_AMBULATORY_CARE_PROVIDER_SITE_OTHER): Payer: 59 | Admitting: Physician Assistant

## 2021-07-08 DIAGNOSIS — L732 Hidradenitis suppurativa: Secondary | ICD-10-CM | POA: Diagnosis not present

## 2021-07-08 DIAGNOSIS — L659 Nonscarring hair loss, unspecified: Secondary | ICD-10-CM

## 2021-07-08 DIAGNOSIS — Z79899 Other long term (current) drug therapy: Secondary | ICD-10-CM

## 2021-07-08 MED ORDER — HUMIRA-CD/UC/HS STARTER 80 MG/0.8ML ~~LOC~~ AJKT: 160.0000 mg | AUTO-INJECTOR | SUBCUTANEOUS | 0 refills | Status: DC

## 2021-07-08 MED ORDER — OPZELURA 1.5 % EX CREA: TOPICAL_CREAM | CUTANEOUS | 6 refills | Status: DC

## 2021-07-08 MED ORDER — HUMIRA (2 PEN) 40 MG/0.4ML ~~LOC~~ AJKT: 40.0000 mg | AUTO-INJECTOR | SUBCUTANEOUS | 6 refills | Status: DC

## 2021-07-08 MED ORDER — CLOBETASOL PROP EMOLLIENT BASE 0.05 % EX CREA: TOPICAL_CREAM | CUTANEOUS | 3 refills | Status: DC

## 2021-07-08 NOTE — Patient Instructions (Signed)
Hidradenitis Suppurativa Hidradenitis suppurativa is a long-term (chronic) skin disease. It is similar to a severe form of acne, but it affects areas of the body where acne would be unusual, especially areas of the body where skin rubs against skin and becomes moist. These include: Underarms. Groin. Genital area. Buttocks. Upper thighs. Breasts. Hidradenitis suppurativa may start out as small lumps or pimples caused by blocked sweat glands or hair follicles. Pimples may develop into deep sores that break open (rupture) and drain pus. Over time, affected areas of skin may thicken and become scarred. This condition is rare and does not spread from person to person (non-contagious). What are the causes? The exact cause of this condition is not known. It may be related to: Female and female hormones. An overactive disease-fighting system (immune system). The immune system may over-react to blocked hair follicles or sweat glands and cause swelling and pus-filled sores. What increases the risk? You are more likely to develop this condition if you: Are female. Are 11-55 years old. Have a family history of hidradenitis suppurativa. Have a personal history of acne. Are overweight. Smoke. Take the medicine lithium. What are the signs or symptoms? The first symptoms are usually painful bumps in the skin, similar to pimples. The condition may get worse over time (progress), or it may only cause mild symptoms. If the disease progresses, symptoms may include: Skin bumps getting bigger and growing deeper into the skin. Bumps rupturing and draining pus. Itchy, infected skin. Skin getting thicker and scarred. Tunnels under the skin (fistulas) where pus drains from a bump. Pain during daily activities, such as pain during walking if your groin area is affected. Emotional problems, such as stress or depression. This condition may affect your appearance and your ability or willingness to wear certain clothes  or do certain activities. How is this diagnosed? This condition is diagnosed by a health care provider who specializes in skin diseases (dermatologist). You may be diagnosed based on: Your symptoms and medical history. A physical exam. Testing a pus sample for infection. Blood tests. How is this treated? Your treatment will depend on how severe your symptoms are. The same treatment will not work for everybody with this condition. You may need to try several treatments to find what works best for you. Treatment may include: Cleaning and bandaging (dressing) your wounds as needed. Lifestyle changes, such as new skin care routines. Taking medicines, such as: Antibiotics. Acne medicines. Medicines to reduce the activity of the immune system. A diabetes medicine (metformin). Birth control pills, for women. Steroids to reduce swelling and pain. Working with a mental health care provider, if you experience emotional distress due to this condition. If you have severe symptoms that do not get better with medicine, you may need surgery. Surgery may involve: Using a laser to clear the skin and remove hair follicles. Opening and draining deep sores. Removing the areas of skin that are diseased and scarred. Follow these instructions at home: Medicines  Take over-the-counter and prescription medicines only as told by your health care provider. If you were prescribed an antibiotic medicine, take it as told by your health care provider. Do not stop taking the antibiotic even if your condition improves. Skin care If you have open wounds, cover them with a clean dressing as told by your health care provider. Keep wounds clean by washing them gently with soap and water when you bathe. Do not shave the areas where you get hidradenitis suppurativa. Do not wear deodorant. Wear loose-fitting   clothes. Try to avoid getting overheated or sweaty. If you get sweaty or wet, change into clean, dry clothes as soon  as you can. To help relieve pain and itchiness, cover sore areas with a warm, clean washcloth (warm compress) for 5-10 minutes as often as needed. If told by your health care provider, take a bleach bath twice a week: Fill your bathtub halfway with water. Pour in  cup of unscented household bleach. Soak in the tub for 5-10 minutes. Only soak from the neck down. Avoid water on your face and hair. Shower to rinse off the bleach from your skin. General instructions Learn as much as you can about your disease so that you have an active role in your treatment. Work closely with your health care provider to find treatments that work for you. If you are overweight, work with your health care provider to lose weight as recommended. Do not use any products that contain nicotine or tobacco, such as cigarettes and e-cigarettes. If you need help quitting, ask your health care provider. If you struggle with living with this condition, talk with your health care provider or work with a mental health care provider as recommended. Keep all follow-up visits as told by your health care provider. This is important. Where to find more information Hidradenitis Suppurativa Foundation, Inc.: https://www.hs-foundation.org/ American Academy of Dermatology: https://www.aad.org Contact a health care provider if you have: A flare-up of hidradenitis suppurativa. A fever or chills. Trouble controlling your symptoms at home. Trouble doing your daily activities because of your symptoms. Trouble dealing with emotional problems related to your condition. Summary Hidradenitis suppurativa is a long-term (chronic) skin disease. It is similar to a severe form of acne, but it affects areas of the body where acne would be unusual. The first symptoms are usually painful bumps in the skin, similar to pimples. The condition may only cause mild symptoms, or it may get worse over time (progress). If you have open wounds, cover them  with a clean dressing as told by your health care provider. Keep wounds clean by washing them gently with soap and water when you bathe. Besides skin care, treatment may include medicines, laser treatment, and surgery. This information is not intended to replace advice given to you by your health care provider. Make sure you discuss any questions you have with your health care provider. Document Revised: 11/07/2019 Document Reviewed: 11/07/2019 Elsevier Patient Education  2023 Elsevier Inc.  

## 2021-07-09 ENCOUNTER — Ambulatory Visit (HOSPITAL_COMMUNITY)
Admission: RE | Admit: 2021-07-09 | Discharge: 2021-07-09 | Disposition: A | Discharge status: Home / Self Care | Payer: 59 | Source: Ambulatory Visit | Attending: Internal Medicine | Admitting: Internal Medicine

## 2021-07-09 DIAGNOSIS — J439 Emphysema, unspecified: Secondary | ICD-10-CM | POA: Diagnosis not present

## 2021-07-09 IMAGING — DX DG CHEST 2V
2 series · 2 of 2 positions shown · non-contrast
Comparison: [DATE]

CLINICAL DATA: Cough and chest pain.  Emphysema.

EXAM:
CHEST - 2 VIEW

[chest pa]
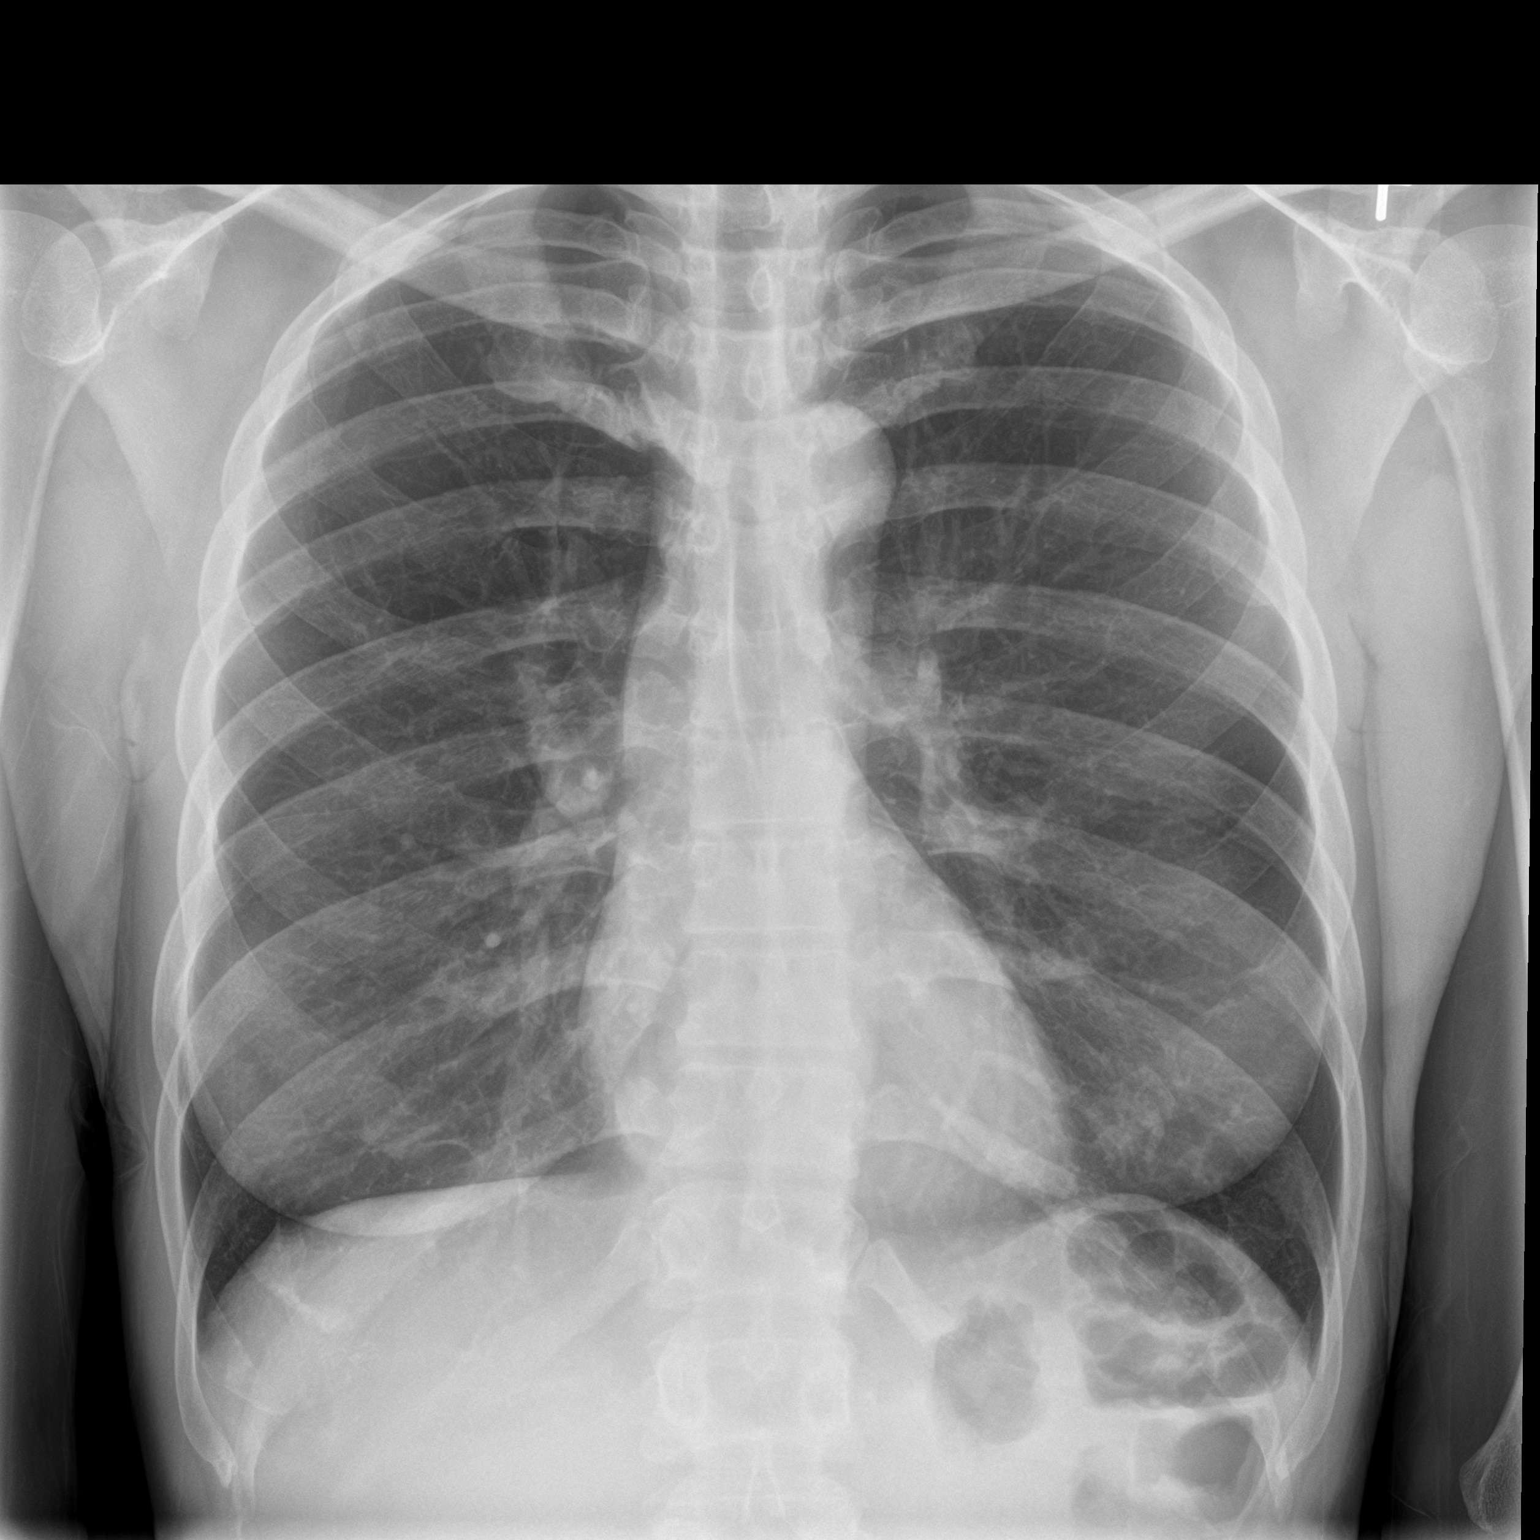

[chest lat]
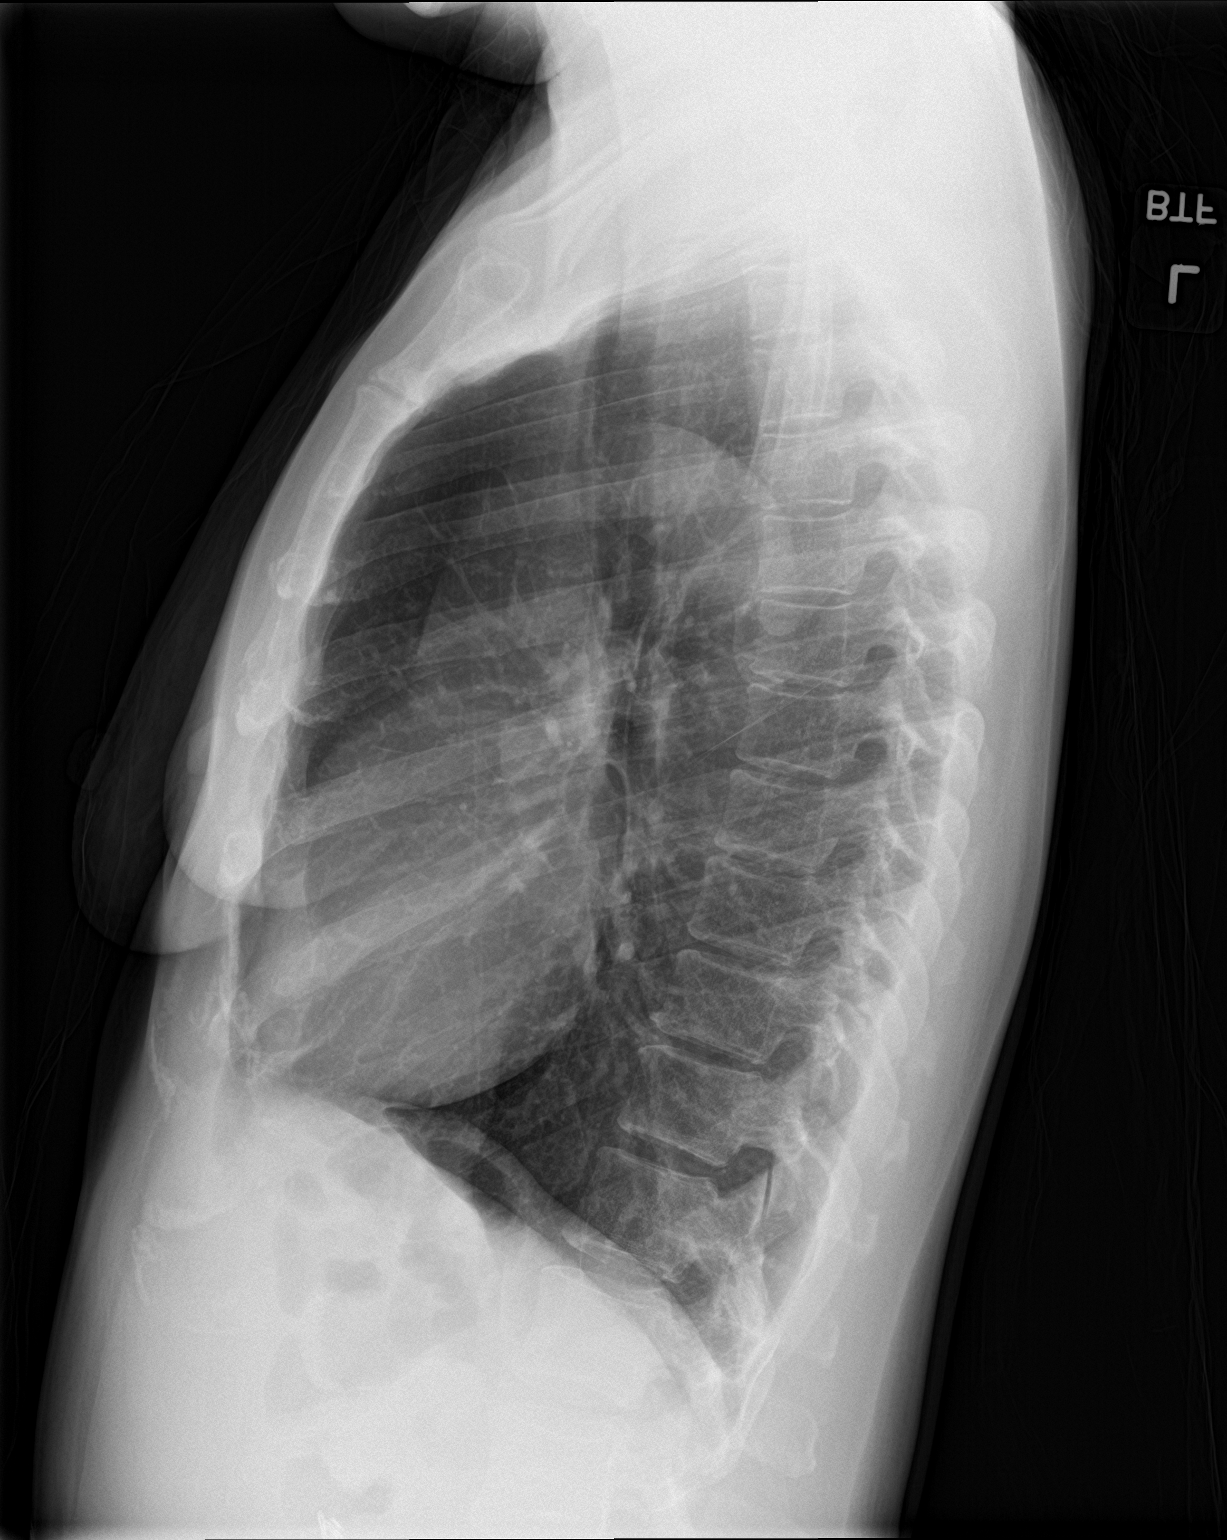

[2 of 2 positions shown; findings below may reference images not displayed]

FINDINGS: The heart size and mediastinal contours are within normal limits.
Pulmonary hyperinflation again seen, consistent with COPD. Both
lungs are clear. The visualized skeletal structures are
unremarkable.
IMPRESSION: COPD. No active cardiopulmonary disease.

## 2021-07-10 ENCOUNTER — Telehealth: Payer: Self-pay | Admitting: *Deleted

## 2021-07-10 NOTE — Telephone Encounter (Signed)
We have received your e-scripts referral and are processing clinical notes. We will follow up with your office if any more documentation is required.

## 2021-07-14 ENCOUNTER — Encounter: Payer: Self-pay | Admitting: Physician Assistant

## 2021-07-14 LAB — HEPATITIS B CORE ANTIBODY, TOTAL: Hep B Core Total Ab: NONREACTIVE

## 2021-07-14 LAB — CBC WITH DIFFERENTIAL/PLATELET
Absolute Monocytes: 584 cells/uL (ref 200–950)
Basophils Absolute: 40 cells/uL (ref 0–200)
Basophils Relative: 0.5 %
Eosinophils Absolute: 24 cells/uL (ref 15–500)
Eosinophils Relative: 0.3 %
HCT: 35.3 % (ref 35.0–45.0)
Hemoglobin: 12.3 g/dL (ref 11.7–15.5)
Lymphs Abs: 2528 cells/uL (ref 850–3900)
MCH: 35.2 pg — ABNORMAL HIGH (ref 27.0–33.0)
MCHC: 34.8 g/dL (ref 32.0–36.0)
MCV: 101.1 fL — ABNORMAL HIGH (ref 80.0–100.0)
MPV: 11.4 fL (ref 7.5–12.5)
Monocytes Relative: 7.3 %
Neutro Abs: 4824 cells/uL (ref 1500–7800)
Neutrophils Relative %: 60.3 %
Platelets: 135 10*3/uL — ABNORMAL LOW (ref 140–400)
RBC: 3.49 10*6/uL — ABNORMAL LOW (ref 3.80–5.10)
RDW: 13.2 % (ref 11.0–15.0)
Total Lymphocyte: 31.6 %
WBC: 8 10*3/uL (ref 3.8–10.8)

## 2021-07-14 LAB — COMPREHENSIVE METABOLIC PANEL
AG Ratio: 1.6 (calc) (ref 1.0–2.5)
ALT: 18 U/L (ref 6–29)
AST: 16 U/L (ref 10–35)
Albumin: 4 g/dL (ref 3.6–5.1)
Alkaline phosphatase (APISO): 74 U/L (ref 31–125)
BUN/Creatinine Ratio: 11 (calc) (ref 6–22)
BUN: 11 mg/dL (ref 7–25)
CO2: 21 mmol/L (ref 20–32)
Calcium: 8.9 mg/dL (ref 8.6–10.2)
Chloride: 109 mmol/L (ref 98–110)
Creat: 1.04 mg/dL — ABNORMAL HIGH (ref 0.50–0.99)
Globulin: 2.5 g/dL (calc) (ref 1.9–3.7)
Glucose, Bld: 83 mg/dL (ref 65–99)
Potassium: 4.2 mmol/L (ref 3.5–5.3)
Sodium: 141 mmol/L (ref 135–146)
Total Bilirubin: 0.4 mg/dL (ref 0.2–1.2)
Total Protein: 6.5 g/dL (ref 6.1–8.1)

## 2021-07-14 LAB — HEPATITIS C ANTIBODY
Hepatitis C Ab: NONREACTIVE
SIGNAL TO CUT-OFF: 0.16 (ref <1.00)

## 2021-07-14 LAB — QUANTIFERON-TB GOLD PLUS
Mitogen-NIL: >10 IU/mL
NIL: 0.07 IU/mL
QuantiFERON-TB Gold Plus: NEGATIVE
TB1-NIL: <0 IU/mL
TB2-NIL: <0 IU/mL

## 2021-07-14 LAB — HEPATITIS B SURFACE ANTIGEN: Hepatitis B Surface Ag: NONREACTIVE

## 2021-07-14 LAB — ANA: Anti Nuclear Antibody (ANA): NEGATIVE

## 2021-07-14 LAB — HEPATITIS B SURFACE ANTIBODY, QUANTITATIVE: Hep B S AB Quant (Post): 50 m[IU]/mL (ref >=10)

## 2021-07-15 ENCOUNTER — Ambulatory Visit: Payer: 59 | Admitting: Orthopaedic Surgery

## 2021-07-15 ENCOUNTER — Encounter: Payer: Self-pay | Admitting: Physician Assistant

## 2021-07-15 ENCOUNTER — Ambulatory Visit (HOSPITAL_COMMUNITY): Payer: 59

## 2021-07-15 ENCOUNTER — Other Ambulatory Visit: Payer: Self-pay | Admitting: Internal Medicine

## 2021-07-15 ENCOUNTER — Encounter: Payer: Self-pay | Admitting: Orthopaedic Surgery

## 2021-07-15 ENCOUNTER — Telehealth: Payer: Self-pay | Admitting: *Deleted

## 2021-07-15 DIAGNOSIS — R232 Flushing: Secondary | ICD-10-CM

## 2021-07-15 NOTE — Telephone Encounter (Signed)
Fax from [facility] needing clinical notes and prior therapy- sent via [facility] portal.

## 2021-07-15 NOTE — Progress Notes (Signed)
   New Patient   Subjective  Kimberly Rocha is a 46 y.o. female who presents for the following: New Patient (Initial Visit) (Patient here today for HS x 1 year patient states that in 2021 she developed blood clots and from there she's had different health issues. Patient states that she has flares on her scalp, both axilla, and in her groin. Patient states that her other passed away in 13-Jun-2022and that's when the flare ups began. Patient states that she thought she had herpes so she had STD testing done and her OBGYN told her that her testing was negative. ).   The following portions of the chart were reviewed this encounter and updated as appropriate:  Tobacco  Allergies  Meds  Problems  Med Hx  Surg Hx  Fam Hx      Objective  Well appearing patient in no apparent distress; mood and affect are within normal limits.  A full examination was performed including scalp, head, eyes, ears, nose, lips, neck, chest, axillae, abdomen, back, buttocks, bilateral upper extremities, bilateral lower extremities, hands, feet, fingers, toes, fingernails, and toenails. All findings within normal limits unless otherwise noted below.  Left Axilla, Pubic, Right Axilla Numerous open comedomes with tunneling. Active cysts and scarring,   Mid Parietal Scalp Homero Fellers balding on right side and crown of scalp with evidence of rubbing and picking.    Assessment & Plan  Hidradenitis suppurativa Right Axilla; Pubic; Left Axilla  Humira new start once we receive lab results.   Comprehensive metabolic panel - Left Axilla, Pubic, Right Axilla  CBC with Differential/Platelet - Left Axilla, Pubic, Right Axilla  Hepatitis B surface antibody,quantitative - Left Axilla, Pubic, Right Axilla  Hepatitis B surface antigen - Left Axilla, Pubic, Right Axilla  Hepatitis B core antibody, total - Left Axilla, Pubic, Right Axilla  Hepatitis C antibody - Left Axilla, Pubic, Right Axilla  QuantiFERON-TB Gold Plus - Left  Axilla, Pubic, Right Axilla  ANA - Left Axilla, Pubic, Right Axilla  Adalimumab (HUMIRA PEN-CD/UC/HS STARTER) 80 MG/0.8ML PNKT - Left Axilla, Pubic, Right Axilla Inject 160 mg into the skin as directed. Inject 160 mg subcutaneous on day 1, then 80 mg subcutaneous on day 15.  Adalimumab (HUMIRA PEN) 40 MG/0.4ML PNKT - Left Axilla, Pubic, Right Axilla Inject 40 mg into the skin once a week. Starting on day 29.  Alopecia Mid Parietal Scalp  Ruxolitinib Phosphate (OPZELURA) 1.5 % CREA - Mid Parietal Scalp Apply to affected area qd  Clobetasol Prop Emollient Base (CLOBETASOL PROPIONATE E) 0.05 % emollient cream - Mid Parietal Scalp Apply to affected area qd  Encounter for long-term (current) use of medications  Related Procedures Comprehensive metabolic panel CBC with Differential/Platelet Hepatitis B surface antibody,quantitative Hepatitis B surface antigen Hepatitis B core antibody, total Hepatitis C antibody QuantiFERON-TB Gold Plus ANA     I, Evonte Prestage, PA-C, have reviewed all documentation's for this visit.  The documentation on 07/15/21 for the exam, diagnosis, procedures and orders are all accurate and complete.

## 2021-07-16 MED ORDER — DOXYCYCLINE HYCLATE 100 MG PO TABS: 100.0000 mg | ORAL_TABLET | Freq: Two times a day (BID) | ORAL | 0 refills | Status: DC

## 2021-07-16 NOTE — Addendum Note (Signed)
Addended by: Gilmore Laroche on: 07/16/2021 01:35 PM   Modules accepted: Orders

## 2021-07-16 NOTE — Telephone Encounter (Signed)
Rx Doxycycline 100 BID #20 [id number] rf.

## 2021-07-17 ENCOUNTER — Telehealth: Payer: Self-pay | Admitting: *Deleted

## 2021-07-17 ENCOUNTER — Ambulatory Visit (INDEPENDENT_AMBULATORY_CARE_PROVIDER_SITE_OTHER): Payer: 59 | Admitting: Internal Medicine

## 2021-07-17 ENCOUNTER — Encounter: Payer: Self-pay | Admitting: Internal Medicine

## 2021-07-17 DIAGNOSIS — J42 Unspecified chronic bronchitis: Secondary | ICD-10-CM | POA: Diagnosis not present

## 2021-07-17 DIAGNOSIS — J439 Emphysema, unspecified: Secondary | ICD-10-CM

## 2021-07-17 DIAGNOSIS — F418 Other specified anxiety disorders: Secondary | ICD-10-CM | POA: Diagnosis not present

## 2021-07-17 DIAGNOSIS — F410 Panic disorder [episodic paroxysmal anxiety] without agoraphobia: Secondary | ICD-10-CM | POA: Diagnosis not present

## 2021-07-17 MED ORDER — ALPRAZOLAM 0.5 MG PO TABS: 0.5000 mg | ORAL_TABLET | Freq: Two times a day (BID) | ORAL | 1 refills | Status: DC | PRN

## 2021-07-17 MED ORDER — FLUOXETINE HCL 10 MG PO TABS: 10.0000 mg | ORAL_TABLET | Freq: Every day | ORAL | 3 refills | Status: DC

## 2021-07-17 NOTE — Assessment & Plan Note (Signed)
Xanax as needed for panic episodes

## 2021-07-17 NOTE — Telephone Encounter (Signed)
Fax from [facility]- received our humira prescriptions- they have submitted prior authorization and are waiting for the determination.

## 2021-07-17 NOTE — Progress Notes (Signed)
Virtual Visit via Telephone Note   This visit type was conducted due to national recommendations for restrictions regarding the COVID-19 Pandemic (e.g. social distancing) in an effort to limit this patient's exposure and mitigate transmission in our community.  Due to her co-morbid illnesses, this patient is at least at moderate risk for complications without adequate follow up.  This format is felt to be most appropriate for this patient at this time.  The patient did not have access to video technology/had technical difficulties with video requiring transitioning to audio format only (telephone).  All issues noted in this document were discussed and addressed.  No physical exam could be performed with this format.  Evaluation Performed:  Follow-up visit  Date:  07/17/2021   ID:  Kimberly Rocha, DOB [birth year], MRN [id number]  Patient Location: Home Provider Location: Office/Clinic  Participants: Patient Location of Patient: Home Location of Provider: Telehealth Consent was obtain for visit to be over via telehealth. I verified that I am speaking with the correct person using two identifiers.  PCP:  Anabel Halon, MD   Chief Complaint: Anxiety  History of Present Illness:    Kimberly Rocha is a 46 y.o. female with PMH of PE (11/2019), hyperthyroidism, seizure disorder, ?herpes simplex and anxiety with depression who has a televisit for complaint of anxiety/panic episode.  She has been taking Lexapro and Vistaril for anxiety.  She was placed on Xanax as needed for panic episodes, but has run out of it.  She had an episode of severe dyspnea and sweating while she was in [employer] at [employer].  She tried to do breathing into plastic bag to relieve her anxiety. She has had panic episodes, where she feels short of breath and has severe flushing with palpitations. Her night sweats have resolved since stopping Remeron.  The patient does not have symptoms concerning for COVID-19  infection (fever, chills, cough, or new shortness of breath).   Past Medical, Surgical, Social History, Allergies, and Medications have been Reviewed.  Past Medical History:  Diagnosis Date   Borderline high cholesterol    Borderline hypertension    Gallstones    Thyroid disease    Past Surgical History:  Procedure Laterality Date   CESAREAN SECTION     CHOLECYSTECTOMY N/A 08/22/2012   Procedure: LAPAROSCOPIC CHOLECYSTECTOMY;  Surgeon: Dalia Heading, MD;  Location: [facility];  Service: General;  Laterality: N/A;   GALLBLADDER SURGERY     HERNIA REPAIR     TUBAL LIGATION       Current Meds  Medication Sig   Adalimumab (HUMIRA PEN) 40 MG/0.4ML PNKT Inject 40 mg into the skin once a week. Starting on day 29.   Adalimumab (HUMIRA PEN-CD/UC/HS STARTER) 80 MG/0.8ML PNKT Inject 160 mg into the skin as directed. Inject 160 mg subcutaneous on day 1, then 80 mg subcutaneous on day 15.   augmented betamethasone dipropionate (DIPROLENE-AF) 0.05 % cream Apply topically as needed.   clindamycin (CLEOCIN T) 1 % lotion Apply topically every other day.   clindamycin (CLINDAGEL) 1 % gel APPLY TOPICALLY TO THE AFFECTED AREA TWICE DAILY   Clobetasol Prop Emollient Base (CLOBETASOL PROPIONATE E) 0.05 % emollient cream Apply to affected area qd   doxycycline (VIBRA-TABS) 100 MG tablet Take 1 tablet (100 mg total) by mouth in the morning and at bedtime.   FLUoxetine (PROZAC) 10 MG tablet Take 1 tablet (10 mg total) by mouth daily.   HYDROcodone-acetaminophen (NORCO/VICODIN) 5-325 MG tablet One tablet  every six hours for pain.  Limit 7 days.   hydrOXYzine (VISTARIL) 50 MG capsule Take 1 capsule (50 mg total) by mouth 3 (three) times daily as needed.   ketoconazole (NIZORAL) 2 % shampoo APPLY TOPICALLY 2 TIMES A WEEK   levothyroxine (SYNTHROID) 75 MCG tablet Take 1 tablet (75 mcg total) by mouth daily before breakfast.   naproxen (NAPROSYN) 500 MG tablet Take 500 mg by mouth as needed.   progesterone  (PROMETRIUM) 100 MG capsule TAKE 1 CAPSULE(100 MG) BY MOUTH AT BEDTIME   Ruxolitinib Phosphate (OPZELURA) 1.5 % CREA Apply to affected area qd   triamcinolone cream (KENALOG) 0.1 % Apply 1 application topically 2 (two) times daily.   valACYclovir (VALTREX) 1000 MG tablet Take 1,000 mg by mouth daily.   [DISCONTINUED] ALPRAZolam (XANAX) 0.5 MG tablet TAKE 1 TABLET(0.5 MG) BY MOUTH TWICE DAILY AS NEEDED FOR ANXIETY   [DISCONTINUED] escitalopram (LEXAPRO) 20 MG tablet TAKE 1 TABLET(20 MG) BY MOUTH DAILY     Allergies:   Demerol   ROS:   Please see the history of present illness.     All other systems reviewed and are negative.   Labs/Other Tests and Data Reviewed:    Recent Labs: 05/28/2021: TSH 6.570 07/09/2021: ALT 18; BUN 11; Creat 1.04; Hemoglobin 12.3; Platelets 135; Potassium 4.2; Sodium 141   Recent Lipid Panel Lab Results  Component Value Date/Time   CHOL 233 (H) 07/25/2020 10:29 AM   TRIG 63 07/25/2020 10:29 AM   HDL 47 07/25/2020 10:29 AM   CHOLHDL 5.0 (H) 07/25/2020 10:29 AM   LDLCALC 175 (H) 07/25/2020 10:29 AM    Wt Readings from Last 3 Encounters:  06/02/21 119 lb (54 kg)  04/28/21 120 lb (54.4 kg)  04/24/21 120 lb (54.4 kg)    ASSESSMENT & PLAN:    Panic disorder Xanax as needed for panic episodes  Anxiety with depression Uncontrolled with Lexapro 20 mg QD and Vistaril 50 mg TID PRN Had referred to psychiatry/BH therapy Will taper Lexapro and start Prozac instead Xanax as needed for panic disorder Current hypothyroidism can also lead to anhedonia and fatigue, followed by endocrinology  Pulmonary emphysema (HCC) Noted on CT chest No dyspnea/wheezing currently Requests referral to pulmonology in [place], provided    Time:   Today, I have spent 17 minutes reviewing the chart, including problem list, medications, and with the patient with telehealth technology discussing the above problems.   Medication Adjustments/Labs and Tests Ordered: Current  medicines are reviewed at length with the patient today.  Concerns regarding medicines are outlined above.   Tests Ordered: Orders Placed This Encounter  Procedures   Ambulatory referral to Psychiatry   Ambulatory referral to Pulmonology    Medication Changes: Meds ordered this encounter  Medications   FLUoxetine (PROZAC) 10 MG tablet    Sig: Take 1 tablet (10 mg total) by mouth daily.    Dispense:  30 tablet    Refill:  3    DC Lexapro   ALPRAZolam (XANAX) 0.5 MG tablet    Sig: Take 1 tablet (0.5 mg total) by mouth 2 (two) times daily as needed for anxiety.    Dispense:  30 tablet    Refill:  1     Note: This dictation was prepared with Dragon dictation along with smaller phrase technology. Similar sounding words can be transcribed inadequately or may not be corrected upon review. Any transcriptional errors that result from this process are unintentional.      Disposition:  Follow  up  Signed, Anabel Halon, MD  07/17/2021 5:15 PM     [facility]

## 2021-07-17 NOTE — Telephone Encounter (Signed)
Prior authorization approved for Humira  07/16/2021-07/17/2022 PA # [id number]

## 2021-07-17 NOTE — Patient Instructions (Signed)
Please start taking Lexapro half tablet for a week and then discontinue.  Please start taking Prozac as prescribed after stopping Lexapro.  You are being referred to compassionate care clinic.  [address] [place], [place] [address] [phone]

## 2021-07-17 NOTE — Assessment & Plan Note (Signed)
Noted on CT chest No dyspnea/wheezing currently Requests referral to pulmonology in [place], provided

## 2021-07-22 ENCOUNTER — Telehealth: Payer: Self-pay | Admitting: Physician Assistant

## 2021-07-22 NOTE — Telephone Encounter (Signed)
Phone call to [facility] to see why they can't process the patient's Humira prescription. Per Angelica Chessman with [facility] they don't have the patient's curent insurance information. I gave [employer] information. Angelica Chessman was able to run the prescription and the patient's copay is $5, per [facility] she will contact the patient.

## 2021-07-23 ENCOUNTER — Ambulatory Visit: Payer: 59

## 2021-07-24 ENCOUNTER — Other Ambulatory Visit: Payer: Self-pay | Admitting: Internal Medicine

## 2021-07-24 ENCOUNTER — Encounter: Payer: Self-pay | Admitting: Internal Medicine

## 2021-07-24 DIAGNOSIS — L309 Dermatitis, unspecified: Secondary | ICD-10-CM

## 2021-07-24 MED ORDER — TRIAMCINOLONE ACETONIDE 0.1 % EX CREA: 1.0000 | TOPICAL_CREAM | Freq: Two times a day (BID) | CUTANEOUS | 0 refills | Status: DC

## 2021-07-30 ENCOUNTER — Ambulatory Visit: Payer: 59

## 2021-08-08 ENCOUNTER — Telehealth: Payer: Self-pay | Admitting: Internal Medicine

## 2021-08-08 ENCOUNTER — Encounter: Payer: Self-pay | Admitting: Internal Medicine

## 2021-08-08 ENCOUNTER — Ambulatory Visit (INDEPENDENT_AMBULATORY_CARE_PROVIDER_SITE_OTHER): Payer: 59 | Admitting: Internal Medicine

## 2021-08-08 DIAGNOSIS — F418 Other specified anxiety disorders: Secondary | ICD-10-CM

## 2021-08-08 NOTE — Telephone Encounter (Signed)
Patient made telephone visit 08-08-21

## 2021-08-08 NOTE — Telephone Encounter (Signed)
Pt called requesting you to call her. I asked what it was pertaining to and she started crying about her needing to leave her home, go to rehab possibly, just wants a call back before you leave

## 2021-08-08 NOTE — Patient Instructions (Addendum)
Please contact [facility] at [phone] to schedule appointment for anxiety.  Please take Xanax 0.5 mg as needed for severe anxiety.

## 2021-08-08 NOTE — Assessment & Plan Note (Addendum)
Uncontrolled with Prozac 10 mg QD and Vistaril 50 mg TID PRN Recently lost her grandmother Had referred to psychiatry/BH therapy - advised to contact them Xanax as needed for panic disorder - has been taking 1/2 tablet, advised to take full tablet PRN Current hypothyroidism can also lead to anhedonia and fatigue, followed by endocrinology

## 2021-08-08 NOTE — Progress Notes (Signed)
Virtual Visit via Telephone Note   This visit type was conducted due to national recommendations for restrictions regarding the COVID-19 Pandemic (e.g. social distancing) in an effort to limit this patient's exposure and mitigate transmission in our community.  Due to her co-morbid illnesses, this patient is at least at moderate risk for complications without adequate follow up.  This format is felt to be most appropriate for this patient at this time.  The patient did not have access to video technology/had technical difficulties with video requiring transitioning to audio format only (telephone).  All issues noted in this document were discussed and addressed.  No physical exam could be performed with this format.  Evaluation Performed:  Follow-up visit  Date:  08/08/2021   ID:  Kimberly Rocha, DOB February 22, 1975, MRN [id number]  Patient Location: Home Provider Location: Office/Clinic  Participants: Patient Location of Patient: Home Location of Provider: Telehealth Consent was obtain for visit to be over via telehealth. I verified that I am speaking with the correct person using two identifiers.  PCP:  Anabel Halon, MD   Chief Complaint: Anxiety  History of Present Illness:    Kimberly Rocha is a 46 y.o. female who has a televisit for c/o anxiety/panic episode.  She has been taking Prozac and Vistaril for anxiety.  She was placed on Xanax as needed for panic episodes as well. She was on Lexapro and Remeron in the past, but had severe night sweats with them. She has h/o depression and suicidal ideations in the past. She denies any acute stressors. She has had panic episodes, where she feels short of breath and has severe flushing with palpitations. She was referred to [facility] in [place], but has not heard from them yet.  The patient does not have symptoms concerning for COVID-19 infection (fever, chills, cough, or new shortness of breath).   Past Medical, Surgical, Social  History, Allergies, and Medications have been Reviewed.  Past Medical History:  Diagnosis Date   Borderline high cholesterol    Borderline hypertension    Gallstones    Thyroid disease    Past Surgical History:  Procedure Laterality Date   CESAREAN SECTION     CHOLECYSTECTOMY N/A 08/22/2012   Procedure: LAPAROSCOPIC CHOLECYSTECTOMY;  Surgeon: Dalia Heading, MD;  Location: [facility];  Service: General;  Laterality: N/A;   GALLBLADDER SURGERY     HERNIA REPAIR     TUBAL LIGATION       Current Meds  Medication Sig   Adalimumab (HUMIRA PEN) 40 MG/0.4ML PNKT Inject 40 mg into the skin once a week. Starting on day 29.   Adalimumab (HUMIRA PEN-CD/UC/HS STARTER) 80 MG/0.8ML PNKT Inject 160 mg into the skin as directed. Inject 160 mg subcutaneous on day 1, then 80 mg subcutaneous on day 15.   ALPRAZolam (XANAX) 0.5 MG tablet Take 1 tablet (0.5 mg total) by mouth 2 (two) times daily as needed for anxiety.   augmented betamethasone dipropionate (DIPROLENE-AF) 0.05 % cream Apply topically as needed.   clindamycin (CLEOCIN T) 1 % lotion Apply topically every other day.   clindamycin (CLINDAGEL) 1 % gel APPLY TOPICALLY TO THE AFFECTED AREA TWICE DAILY   Clobetasol Prop Emollient Base (CLOBETASOL PROPIONATE E) 0.05 % emollient cream Apply to affected area qd   doxycycline (VIBRA-TABS) 100 MG tablet Take 1 tablet (100 mg total) by mouth in the morning and at bedtime.   FLUoxetine (PROZAC) 10 MG tablet Take 1 tablet (10 mg total) by  mouth daily.   HYDROcodone-acetaminophen (NORCO/VICODIN) 5-325 MG tablet One tablet every six hours for pain.  Limit 7 days.   hydrOXYzine (VISTARIL) 50 MG capsule Take 1 capsule (50 mg total) by mouth 3 (three) times daily as needed.   ketoconazole (NIZORAL) 2 % shampoo APPLY TOPICALLY 2 TIMES A WEEK   levothyroxine (SYNTHROID) 75 MCG tablet Take 1 tablet (75 mcg total) by mouth daily before breakfast.   naproxen (NAPROSYN) 500 MG tablet Take 500 mg by mouth as needed.    progesterone (PROMETRIUM) 100 MG capsule TAKE 1 CAPSULE(100 MG) BY MOUTH AT BEDTIME   Ruxolitinib Phosphate (OPZELURA) 1.5 % CREA Apply to affected area qd   triamcinolone cream (KENALOG) 0.1 % Apply 1 Application topically 2 (two) times daily.   valACYclovir (VALTREX) 1000 MG tablet Take 1,000 mg by mouth daily.     Allergies:   Demerol   ROS:   Please see the history of present illness.     All other systems reviewed and are negative.   Labs/Other Tests and Data Reviewed:    Recent Labs: 05/28/2021: TSH 6.570 07/09/2021: ALT 18; BUN 11; Creat 1.04; Hemoglobin 12.3; Platelets 135; Potassium 4.2; Sodium 141   Recent Lipid Panel Lab Results  Component Value Date/Time   CHOL 233 (H) 07/25/2020 10:29 AM   TRIG 63 07/25/2020 10:29 AM   HDL 47 07/25/2020 10:29 AM   CHOLHDL 5.0 (H) 07/25/2020 10:29 AM   LDLCALC 175 (H) 07/25/2020 10:29 AM    Wt Readings from Last 3 Encounters:  06/02/21 119 lb (54 kg)  04/28/21 120 lb (54.4 kg)  04/24/21 120 lb (54.4 kg)     ASSESSMENT & PLAN:    Anxiety with depression Uncontrolled with Prozac 10 mg QD and Vistaril 50 mg TID PRN Recently lost her grandmother Had referred to psychiatry/BH therapy - advised to contact them Xanax as needed for panic disorder - has been taking 1/2 tablet, advised to take full tablet PRN Current hypothyroidism can also lead to anhedonia and fatigue, followed by endocrinology   Time:   Today, I have spent 13 minutes reviewing the chart, including problem list, medications, and with the patient with telehealth technology discussing the above problems.   Medication Adjustments/Labs and Tests Ordered: Current medicines are reviewed at length with the patient today.  Concerns regarding medicines are outlined above.   Tests Ordered: No orders of the defined types were placed in this encounter.   Medication Changes: No orders of the defined types were placed in this encounter.    Note: This dictation was  prepared with Dragon dictation along with smaller phrase technology. Similar sounding words can be transcribed inadequately or may not be corrected upon review. Any transcriptional errors that result from this process are unintentional.      Disposition:  Follow up  Signed, Anabel Halon, MD  08/08/2021 10:50 AM     [facility]

## 2021-08-11 ENCOUNTER — Ambulatory Visit (HOSPITAL_COMMUNITY): Payer: 59 | Attending: Orthopaedic Surgery

## 2021-08-26 LAB — T4, FREE: Free T4: 1.43 ng/dL (ref 0.82–1.77)

## 2021-08-26 LAB — TSH: TSH: 2.46 u[IU]/mL (ref 0.450–4.500)

## 2021-08-27 ENCOUNTER — Telehealth: Payer: Self-pay | Admitting: Internal Medicine

## 2021-08-27 NOTE — Telephone Encounter (Signed)
Pt is wanting to switch providers from Dr [name] to Dr Kendrick Fries.   Dr [name] are you ok with this switch  Dr Kendrick Fries are you ok with this switch  Please advise

## 2021-08-27 NOTE — Telephone Encounter (Signed)
Patient has appointment with Dr Henrene Pastor tomorrow. Nothing further needed

## 2021-08-27 NOTE — Telephone Encounter (Signed)
Fine with me, I planned to refer her to that office anyway where we can do same day spirometry to answer her outstanding questions about whether COPD is really a factor in any of her ongoing symptoms and apologize this was not done as I had ordered last year.

## 2021-08-27 NOTE — Telephone Encounter (Signed)
PT was referred to [place] by Dr Allena Katz. Pt is already established with MW in [place] office. LOV 08/15/20. When called to schedule f/u, pt stated she did not wish to see MW again and demanded to be scheduled with someone else. Placed on BQ schedule to alleviate tension in the call as pt did not understand the office protocol. Please advise if this switch is okay.

## 2021-08-28 ENCOUNTER — Encounter: Payer: Self-pay | Admitting: Physician Assistant

## 2021-08-28 ENCOUNTER — Ambulatory Visit: Payer: 59 | Admitting: Pulmonary Disease

## 2021-08-28 DIAGNOSIS — L659 Nonscarring hair loss, unspecified: Secondary | ICD-10-CM

## 2021-08-28 MED ORDER — CLOBETASOL PROP EMOLLIENT BASE 0.05 % EX CREA: TOPICAL_CREAM | CUTANEOUS | 9 refills | Status: DC

## 2021-08-28 NOTE — Progress Notes (Deleted)
Synopsis: Referred in 2022 for dyspnea on exertion that developed after a plane flight to [facility] where she had a pulmonary embolism.  She was initially seen by Dr. [name] in the [place] office.  Transferred to [facility] in August 2023 to the [address] office in [place]  Subjective:   PATIENT ID: Kimberly Rocha GENDER: female DOB: 1975/09/23, MRN: [id number]   HPI  No chief complaint on file.   ***  Records from her visit with Dr. Sharee Pimple in July 2022 reviewed where she was seen for ongoing dyspnea and fatigue.  Recommendations were made for her to stop smoking cigarettes and spirometry order was placed.  Past Medical History:  Diagnosis Date   Borderline high cholesterol    Borderline hypertension    Gallstones    Thyroid disease      Family History  Problem Relation Age of Onset   COPD Mother    COPD Father    HIV/AIDS Brother    Stroke Maternal Grandmother    Other Daughter        has issues with cervix     Social History   Socioeconomic History   Marital status: Married    Spouse name: Not on file   Number of children: Not on file   Years of education: Not on file   Highest education level: Not on file  Occupational History   Not on file  Tobacco Use   Smoking status: Former    Packs/day: 0.50    Types: Cigarettes   Smokeless tobacco: Never  Vaping Use   Vaping Use: Former  Substance and Sexual Activity   Alcohol use: Yes    Comment: occ   Drug use: Yes    Frequency: 3.0 times per week    Types: Marijuana    Comment: twice a day   Sexual activity: Yes    Birth control/protection: Surgical    Comment: tubal  Other Topics Concern   Not on file  Social History Narrative   Not on file   Social Determinants of Health   Financial Resource Strain: Medium Risk (09/09/2020)   Overall Financial Resource Strain (CARDIA)    Difficulty of Paying Living Expenses: Somewhat hard  Food Insecurity: Food Insecurity Present (09/09/2020)   Hunger Vital Sign     Worried About Running Out of Food in the Last Year: Sometimes true    Ran Out of Food in the Last Year: Sometimes true  Transportation Needs: No Transportation Needs (09/09/2020)   PRAPARE - Administrator, Civil Service (Medical): No    Lack of Transportation (Non-Medical): No  Physical Activity: Insufficiently Active (09/09/2020)   Exercise Vital Sign    Days of Exercise per Week: 2 days    Minutes of Exercise per Session: 20 min  Stress: Stress Concern Present (09/09/2020)   [employer] of Occupational Health - Occupational Stress Questionnaire    Feeling of Stress : Very much  Social Connections: Moderately Isolated (09/09/2020)   Social Connection and Isolation Panel [NHANES]    Frequency of Communication with Friends and Family: More than three times a week    Frequency of Social Gatherings with Friends and Family: Twice a week    Attends Religious Services: More than 4 times per year    Active Member of [employer] or Organizations: No    Attends Banker Meetings: Never    Marital Status: Never married  Intimate Partner Violence: Not At Risk (09/09/2020)   Humiliation, Afraid, Rape,  and Kick questionnaire    Fear of Current or Ex-Partner: No    Emotionally Abused: No    Physically Abused: No    Sexually Abused: No     Allergies  Allergen Reactions   Demerol Hives and Itching     Outpatient Medications Prior to Visit  Medication Sig Dispense Refill   Adalimumab (HUMIRA PEN) 40 MG/0.4ML PNKT Inject 40 mg into the skin once a week. Starting on day 29. 2 each 6   Adalimumab (HUMIRA PEN-CD/UC/HS STARTER) 80 MG/0.8ML PNKT Inject 160 mg into the skin as directed. Inject 160 mg subcutaneous on day 1, then 80 mg subcutaneous on day 15. 1 each 0   ALPRAZolam (XANAX) 0.5 MG tablet Take 1 tablet (0.5 mg total) by mouth 2 (two) times daily as needed for anxiety. 30 tablet 1   augmented betamethasone dipropionate (DIPROLENE-AF) 0.05 % cream Apply topically as  needed.     clindamycin (CLEOCIN T) 1 % lotion Apply topically every other day.     clindamycin (CLINDAGEL) 1 % gel APPLY TOPICALLY TO THE AFFECTED AREA TWICE DAILY 30 g 1   Clobetasol Prop Emollient Base (CLOBETASOL PROPIONATE E) 0.05 % emollient cream Apply to affected area qd 60 g 9   doxycycline (VIBRA-TABS) 100 MG tablet Take 1 tablet (100 mg total) by mouth in the morning and at bedtime. 20 tablet 0   FLUoxetine (PROZAC) 10 MG tablet Take 1 tablet (10 mg total) by mouth daily. 30 tablet 3   HYDROcodone-acetaminophen (NORCO/VICODIN) 5-325 MG tablet One tablet every six hours for pain.  Limit 7 days. 28 tablet 0   hydrOXYzine (VISTARIL) 50 MG capsule Take 1 capsule (50 mg total) by mouth 3 (three) times daily as needed. 90 capsule 0   ketoconazole (NIZORAL) 2 % shampoo APPLY TOPICALLY 2 TIMES A WEEK 120 mL 0   levothyroxine (SYNTHROID) 75 MCG tablet Take 1 tablet (75 mcg total) by mouth daily before breakfast. 90 tablet 1   naproxen (NAPROSYN) 500 MG tablet Take 500 mg by mouth as needed.     progesterone (PROMETRIUM) 100 MG capsule TAKE 1 CAPSULE(100 MG) BY MOUTH AT BEDTIME 30 capsule 2   Ruxolitinib Phosphate (OPZELURA) 1.5 % CREA Apply to affected area qd 60 g 6   triamcinolone cream (KENALOG) 0.1 % Apply 1 Application topically 2 (two) times daily. 30 g 0   valACYclovir (VALTREX) 1000 MG tablet Take 1,000 mg by mouth daily.     No facility-administered medications prior to visit.    ROS    Objective:  Physical Exam   There were no vitals filed for this visit.  ***  CBC    Component Value Date/Time   WBC 8.0 07/09/2021 1057   RBC 3.49 (L) 07/09/2021 1057   HGB 12.3 07/09/2021 1057   HGB 12.3 08/29/2020 1000   HCT 35.3 07/09/2021 1057   HCT 36.1 08/29/2020 1000   PLT 135 (L) 07/09/2021 1057   PLT 209 08/29/2020 1000   MCV 101.1 (H) 07/09/2021 1057   MCV 98 (H) 08/29/2020 1000   MCH 35.2 (H) 07/09/2021 1057   MCHC 34.8 07/09/2021 1057   RDW 13.2 07/09/2021 1057    RDW 13.1 08/29/2020 1000   LYMPHSABS 2,528 07/09/2021 1057   LYMPHSABS 3.0 08/29/2020 1000   MONOABS 0.6 04/21/2020 1055   EOSABS 24 07/09/2021 1057   EOSABS 0.0 08/29/2020 1000   BASOSABS 40 07/09/2021 1057   BASOSABS 0.0 08/29/2020 1000     Chest imaging:  PFT:  Labs:  Path:  Echo: October 2021 TTE: LVEF 60 to 65%, RV size and function normal, valves okay  Heart Catheterization:  O2 saturation assessment: 08/15/2020   Walked RA  approx   450  ft  @ very fast pace  stopped due to  End of study, some sob on last laps but lowest sats 97%     Assessment & Plan:   No diagnosis found.  Discussion: ***    Current Outpatient Medications:    Adalimumab (HUMIRA PEN) 40 MG/0.4ML PNKT, Inject 40 mg into the skin once a week. Starting on day 29., Disp: 2 each, Rfl: 6   Adalimumab (HUMIRA PEN-CD/UC/HS STARTER) 80 MG/0.8ML PNKT, Inject 160 mg into the skin as directed. Inject 160 mg subcutaneous on day 1, then 80 mg subcutaneous on day 15., Disp: 1 each, Rfl: 0   ALPRAZolam (XANAX) 0.5 MG tablet, Take 1 tablet (0.5 mg total) by mouth 2 (two) times daily as needed for anxiety., Disp: 30 tablet, Rfl: 1   augmented betamethasone dipropionate (DIPROLENE-AF) 0.05 % cream, Apply topically as needed., Disp: , Rfl:    clindamycin (CLEOCIN T) 1 % lotion, Apply topically every other day., Disp: , Rfl:    clindamycin (CLINDAGEL) 1 % gel, APPLY TOPICALLY TO THE AFFECTED AREA TWICE DAILY, Disp: 30 g, Rfl: 1   Clobetasol Prop Emollient Base (CLOBETASOL PROPIONATE E) 0.05 % emollient cream, Apply to affected area qd, Disp: 60 g, Rfl: 9   doxycycline (VIBRA-TABS) 100 MG tablet, Take 1 tablet (100 mg total) by mouth in the morning and at bedtime., Disp: 20 tablet, Rfl: 0   FLUoxetine (PROZAC) 10 MG tablet, Take 1 tablet (10 mg total) by mouth daily., Disp: 30 tablet, Rfl: 3   HYDROcodone-acetaminophen (NORCO/VICODIN) 5-325 MG tablet, One tablet every six hours for pain.  Limit 7 days., Disp: 28  tablet, Rfl: 0   hydrOXYzine (VISTARIL) 50 MG capsule, Take 1 capsule (50 mg total) by mouth 3 (three) times daily as needed., Disp: 90 capsule, Rfl: 0   ketoconazole (NIZORAL) 2 % shampoo, APPLY TOPICALLY 2 TIMES A WEEK, Disp: 120 mL, Rfl: 0   levothyroxine (SYNTHROID) 75 MCG tablet, Take 1 tablet (75 mcg total) by mouth daily before breakfast., Disp: 90 tablet, Rfl: 1   naproxen (NAPROSYN) 500 MG tablet, Take 500 mg by mouth as needed., Disp: , Rfl:    progesterone (PROMETRIUM) 100 MG capsule, TAKE 1 CAPSULE(100 MG) BY MOUTH AT BEDTIME, Disp: 30 capsule, Rfl: 2   Ruxolitinib Phosphate (OPZELURA) 1.5 % CREA, Apply to affected area qd, Disp: 60 g, Rfl: 6   triamcinolone cream (KENALOG) 0.1 %, Apply 1 Application topically 2 (two) times daily., Disp: 30 g, Rfl: 0   valACYclovir (VALTREX) 1000 MG tablet, Take 1,000 mg by mouth daily., Disp: , Rfl:

## 2021-09-02 ENCOUNTER — Ambulatory Visit (INDEPENDENT_AMBULATORY_CARE_PROVIDER_SITE_OTHER): Payer: Self-pay | Admitting: Nurse Practitioner

## 2021-09-02 ENCOUNTER — Encounter: Payer: Self-pay | Admitting: Nurse Practitioner

## 2021-09-02 VITALS — BP 131/83 | HR 87 | Ht 60.0 in | Wt 112.0 lb

## 2021-09-02 DIAGNOSIS — E890 Postprocedural hypothyroidism: Secondary | ICD-10-CM | POA: Diagnosis not present

## 2021-09-02 MED ORDER — LEVOTHYROXINE SODIUM 75 MCG PO TABS: 75.0000 ug | ORAL_TABLET | Freq: Every day | ORAL | 1 refills | Status: DC

## 2021-09-02 NOTE — Patient Instructions (Signed)

## 2021-09-02 NOTE — Progress Notes (Signed)
09/02/2021     Endocrinology Follow Up Note    Subjective:    Patient ID: Kimberly Rocha, female    DOB: 12/16/1975, PCP Lindell Spar, MD.   Past Medical History:  Diagnosis Date   Borderline high cholesterol    Borderline hypertension    Gallstones    Thyroid disease     Past Surgical History:  Procedure Laterality Date   CESAREAN SECTION     CHOLECYSTECTOMY N/A 08/22/2012   Procedure: LAPAROSCOPIC CHOLECYSTECTOMY;  Surgeon: Jamesetta So, MD;  Location: [facility];  Service: General;  Laterality: N/A;   GALLBLADDER SURGERY     HERNIA REPAIR     TUBAL LIGATION      Social History   Socioeconomic History   Marital status: Married    Spouse name: Not on file   Number of children: Not on file   Years of education: Not on file   Highest education level: Not on file  Occupational History   Not on file  Tobacco Use   Smoking status: Former    Packs/day: 0.50    Types: Cigarettes   Smokeless tobacco: Never  Vaping Use   Vaping Use: Former  Substance and Sexual Activity   Alcohol use: Yes    Comment: occ   Drug use: Yes    Frequency: 3.0 times per week    Types: Marijuana    Comment: twice a day   Sexual activity: Yes    Birth control/protection: Surgical    Comment: tubal  Other Topics Concern   Not on file  Social History Narrative   Not on file   Social Determinants of Health   Financial Resource Strain: Medium Risk (09/09/2020)   Overall Financial Resource Strain (CARDIA)    Difficulty of Paying Living Expenses: Somewhat hard  Food Insecurity: Food Insecurity Present (09/09/2020)   Hunger Vital Sign    Worried About Running Out of Food in the Last Year: Sometimes true    Ran Out of Food in the Last Year: Sometimes true  Transportation Needs: No Transportation Needs (09/09/2020)   PRAPARE - Hydrologist (Medical): No    Lack of Transportation (Non-Medical): No  Physical Activity: Insufficiently Active (09/09/2020)    Exercise Vital Sign    Days of Exercise per Week: 2 days    Minutes of Exercise per Session: 20 min  Stress: Stress Concern Present (09/09/2020)   [place]    Feeling of Stress : Very much  Social Connections: Moderately Isolated (09/09/2020)   Social Connection and Isolation Panel [NHANES]    Frequency of Communication with Friends and Family: More than three times a week    Frequency of Social Gatherings with Friends and Family: Twice a week    Attends Religious Services: More than 4 times per year    Active Member of [employer] or Organizations: No    Attends Music therapist: Never    Marital Status: Never married    Family History  Problem Relation Age of Onset   COPD Mother    COPD Father    HIV/AIDS Brother    Stroke Maternal Grandmother    Other Daughter        has issues with cervix    Outpatient Encounter Medications as of 09/02/2021  Medication Sig   Adalimumab (HUMIRA PEN) 40 MG/0.4ML PNKT Inject 40 mg into the skin once a week. Starting on day  29.   Adalimumab (HUMIRA PEN-CD/UC/HS STARTER) 80 MG/0.8ML PNKT Inject 160 mg into the skin as directed. Inject 160 mg subcutaneous on day 1, then 80 mg subcutaneous on day 15.   ALPRAZolam (XANAX) 0.5 MG tablet Take 1 tablet (0.5 mg total) by mouth 2 (two) times daily as needed for anxiety.   augmented betamethasone dipropionate (DIPROLENE-AF) 0.05 % cream Apply topically as needed.   clindamycin (CLEOCIN T) 1 % lotion Apply topically every other day.   Clobetasol Prop Emollient Base (CLOBETASOL PROPIONATE E) 0.05 % emollient cream Apply to affected area qd   doxycycline (VIBRA-TABS) 100 MG tablet Take 1 tablet (100 mg total) by mouth in the morning and at bedtime.   FLUoxetine (PROZAC) 10 MG tablet Take 1 tablet (10 mg total) by mouth daily.   HYDROcodone-acetaminophen (NORCO/VICODIN) 5-325 MG tablet One tablet every six hours for pain.  Limit 7 days.    hydrOXYzine (VISTARIL) 50 MG capsule Take 1 capsule (50 mg total) by mouth 3 (three) times daily as needed.   levothyroxine (SYNTHROID) 75 MCG tablet Take 1 tablet (75 mcg total) by mouth daily before breakfast.   progesterone (PROMETRIUM) 100 MG capsule TAKE 1 CAPSULE(100 MG) BY MOUTH AT BEDTIME   Ruxolitinib Phosphate (OPZELURA) 1.5 % CREA Apply to affected area qd   triamcinolone cream (KENALOG) 0.1 % Apply 1 Application topically 2 (two) times daily.   [DISCONTINUED] clindamycin (CLINDAGEL) 1 % gel APPLY TOPICALLY TO THE AFFECTED AREA TWICE DAILY   [DISCONTINUED] ketoconazole (NIZORAL) 2 % shampoo APPLY TOPICALLY 2 TIMES A WEEK   [DISCONTINUED] levothyroxine (SYNTHROID) 75 MCG tablet Take 1 tablet (75 mcg total) by mouth daily before breakfast.   [DISCONTINUED] naproxen (NAPROSYN) 500 MG tablet Take 500 mg by mouth as needed.   [DISCONTINUED] valACYclovir (VALTREX) 1000 MG tablet Take 1,000 mg by mouth daily.   No facility-administered encounter medications on file as of 09/02/2021.    ALLERGIES: Allergies  Allergen Reactions   Demerol Hives and Itching    VACCINATION STATUS: Immunization History  Administered Date(s) Administered   Moderna Sars-Covid-2 Vaccination 02/27/2020, 03/27/2020     HPI  Kimberly Rocha is 46 y.o. female who presents today with a medical history as above. she is being seen in follow up after being seen in consultation for hyperthyroidism requested by Anabel Halon, MD.    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  Treatment was successful and she has now been started on thyroid hormone replacement.  she denies dysphagia, choking, no recent voice change.    she denies family history of thyroid dysfunction and denies family hx of thyroid cancer. she denies personal history of goiter. she is not on any thyroid hormone supplements, has been taking antithyroid medication for approximately 30 days. Denies use of Biotin  containing supplements.  she is willing to proceed with appropriate work up and therapy for thyrotoxicosis.   Review of systems  Constitutional: + Minimally fluctuating body weight,  current Body mass index is 21.87 kg/m. , no fatigue, no subjective hyperthermia, no subjective hypothermia Eyes: no blurry vision, no xerophthalmia ENT: no sore throat, no nodules palpated in throat, no dysphagia/odynophagia, no hoarseness Cardiovascular: no chest pain, no shortness of breath, no palpitations, no leg swelling Respiratory: no cough, no shortness of breath Gastrointestinal: no nausea/vomiting/diarrhea Musculoskeletal: no muscle/joint aches Skin: no rashes, no hyperemia, hair is growing back Neurological: no tremors, no numbness, no tingling, no dizziness Psychiatric: no depression, no anxiety   Objective:  BP 131/83   Pulse 87   Ht 5' (1.524 m)   Wt 112 lb (50.8 kg)   BMI 21.87 kg/m   Wt Readings from Last 3 Encounters:  09/02/21 112 lb (50.8 kg)  06/02/21 119 lb (54 kg)  04/28/21 120 lb (54.4 kg)     BP Readings from Last 3 Encounters:  09/02/21 131/83  06/02/21 134/82  04/22/21 133/79                        Physical Exam- Limited  Constitutional:  Body mass index is 21.87 kg/m. , not in acute distress, normal state of mind Eyes:  EOMI, no exophthalmos Neck: Supple Respiratory: Adequate breathing efforts Musculoskeletal: no gross deformities, strength intact in all four extremities, no gross restriction of joint movements Skin:  no rashes, no hyperemia Neurological: no tremor with outstretched hands   CMP     Component Value Date/Time   NA 141 07/09/2021 1057   NA 143 07/25/2020 1029   K 4.2 07/09/2021 1057   CL 109 07/09/2021 1057   CO2 21 07/09/2021 1057   GLUCOSE 83 07/09/2021 1057   BUN 11 07/09/2021 1057   BUN 7 07/25/2020 1029   CREATININE 1.04 (H) 07/09/2021 1057   CALCIUM 8.9 07/09/2021 1057   PROT 6.5 07/09/2021 1057   PROT 7.1 07/25/2020 1029    ALBUMIN 4.5 07/25/2020 1029   AST 16 07/09/2021 1057   ALT 18 07/09/2021 1057   ALKPHOS 79 07/25/2020 1029   BILITOT 0.4 07/09/2021 1057   BILITOT 0.3 07/25/2020 1029   GFRNONAA >60 04/21/2020 1055   GFRAA >60 10/17/2018 1545     CBC    Component Value Date/Time   WBC 8.0 07/09/2021 1057   RBC 3.49 (L) 07/09/2021 1057   HGB 12.3 07/09/2021 1057   HGB 12.3 08/29/2020 1000   HCT 35.3 07/09/2021 1057   HCT 36.1 08/29/2020 1000   PLT 135 (L) 07/09/2021 1057   PLT 209 08/29/2020 1000   MCV 101.1 (H) 07/09/2021 1057   MCV 98 (H) 08/29/2020 1000   MCH 35.2 (H) 07/09/2021 1057   MCHC 34.8 07/09/2021 1057   RDW 13.2 07/09/2021 1057   RDW 13.1 08/29/2020 1000   LYMPHSABS 2,528 07/09/2021 1057   LYMPHSABS 3.0 08/29/2020 1000   MONOABS 0.6 04/21/2020 1055   EOSABS 24 07/09/2021 1057   EOSABS 0.0 08/29/2020 1000   BASOSABS 40 07/09/2021 1057   BASOSABS 0.0 08/29/2020 1000     Diabetic Labs (most recent): No results found for: "HGBA1C", "MICROALBUR"  Lipid Panel     Component Value Date/Time   CHOL 233 (H) 07/25/2020 1029   TRIG 63 07/25/2020 1029   HDL 47 07/25/2020 1029   CHOLHDL 5.0 (H) 07/25/2020 1029   LDLCALC 175 (H) 07/25/2020 1029   LABVLDL 11 07/25/2020 1029     Lab Results  Component Value Date   TSH 2.460 08/25/2021   TSH 6.570 (H) 05/28/2021   TSH 12.100 (H) 03/26/2021   TSH 7.990 (H) 01/21/2021   TSH 1.290 11/27/2020   TSH 1.370 11/27/2020   TSH 0.320 (L) 08/29/2020   TSH 0.326 (L) 07/25/2020   FREET4 1.43 08/25/2021   FREET4 1.24 05/28/2021   FREET4 0.82 03/26/2021   FREET4 1.01 01/21/2021   FREET4 0.98 11/27/2020   FREET4 0.94 11/27/2020   FREET4 1.06 08/29/2020    Results for Rocha, Kimberly A "TOYE" (MRN [id number]) as of 11/29/2020 09:49  Ref. Range 11/27/2020 13:14 11/27/2020  13:15  TSH Latest Ref Range: 0.450 - 4.500 uIU/mL 1.370 1.290  Triiodothyronine,Free,Serum Latest Ref Range: 2.0 - 4.4 pg/mL 2.8 2.9  T4,Free(Direct) Latest Ref Range:  0.82 - 1.77 ng/dL 6.15 1.83  Thyroperoxidase Ab SerPl-aCnc Latest Ref Range: 0 - 34 IU/mL  9  Thyroglobulin Antibody Latest Ref Range: 0.0 - 0.9 IU/mL  <1.0    Uptake and Scan from 10/29/20 CLINICAL DATA:  Hyperthyroidism   EXAM: THYROID SCAN AND UPTAKE - 4 AND 24 HOURS   TECHNIQUE: Following oral administration of I-123 capsule, anterior planar imaging was acquired at 24 hours. Thyroid uptake was calculated with a thyroid probe at 4-6 hours and 24 hours.   RADIOPHARMACEUTICALS:  431 uCi I-123 sodium iodide p.o.   COMPARISON:  Thyroid ultrasound 10/07/2020   FINDINGS: Homogeneous tracer distribution in both thyroid lobes.   No focal areas of increased or decreased tracer localization.   4 hour I-123 uptake = 21.7% (normal 5-20%)   24 hour I-123 uptake = 44.3% (normal 10-30%)   IMPRESSION: Normal thyroid scan with elevated 4 hour and 24 hour radio iodine uptakes.   Findings discussed with hyperthyroidism and Graves disease.     Electronically Signed   By: Ulyses Southward M.D.   On: 10/29/2020 17:17   Latest Reference Range & Units 11/27/20 13:15 01/21/21 14:04 03/26/21 09:14 05/28/21 09:08 08/25/21 11:11  TSH 0.450 - 4.500 uIU/mL 1.290 7.990 (H) 12.100 (H) 6.570 (H) 2.460  Triiodothyronine,Free,Serum 2.0 - 4.4 pg/mL 2.9 2.6     T4,Free(Direct) 0.82 - 1.77 ng/dL 4.37 3.57 8.97 8.47 8.41  Thyroperoxidase Ab SerPl-aCnc 0 - 34 IU/mL 9      Thyroglobulin Antibody 0.0 - 0.9 IU/mL <1.0      (H): Data is abnormally high  Assessment & Plan:   1. Hypothyroidism s/p RAI ablation for Graves Disease  she is being seen at a kind request of Anabel Halon, MD.  Her uptake and scan did show 4-hr uptake of 21.7% and 24-hr uptake of 44.3%, consistent with Graves disease and she had RAI ablation on 12/13/20.    Her previsit thyroid function tests are consistent with appropriate hormone replacement.  She is advised to continue her Levothyroxine 75 mcg po daily before breakfast.   -  The correct intake of thyroid hormone (Levothyroxine, Synthroid), is on empty stomach first thing in the morning, with water, separated by at least 30 minutes from breakfast and other medications,  and separated by more than 4 hours from calcium, iron, multivitamins, acid reflux medications (PPIs).  - This medication is a life-long medication and will be needed to correct thyroid hormone imbalances for the rest of your life.  The dose may change from time to time, based on thyroid blood work.  - It is extremely important to be consistent taking this medication, near the same time each morning.  -AVOID TAKING PRODUCTS CONTAINING BIOTIN (commonly found in Hair, Skin, Nails vitamins) AS IT INTERFERES WITH THE VALIDITY OF THYROID FUNCTION BLOOD TESTS.    -Patient is advised to maintain close follow up with Anabel Halon, MD for primary care needs.     I spent 23 minutes in the care of the patient today including review of labs from Thyroid Function, CMP, and other relevant labs ; imaging/biopsy records (current and previous including abstractions from other facilities); face-to-face time discussing  her lab results and symptoms, medications doses, her options of short and long term treatment based on the latest standards of care / guidelines;  and documenting the encounter.  [name]  participated in the discussions, expressed understanding, and voiced agreement with the above plans.  All questions were answered to her satisfaction. she is encouraged to contact clinic should she have any questions or concerns prior to her return visit.  Follow up plan: Return in about 4 months (around 01/02/2022) for Thyroid follow up, Previsit labs.   Thank you for involving me in the care of this pleasant patient, and I will continue to update you with her progress.   Ronny Bacon, [facility] [address] [place], [place] [address] Phone: [phone] Fax:  [phone]  09/02/2021, 1:27 PM

## 2021-09-03 ENCOUNTER — Encounter: Payer: Self-pay | Admitting: Physician Assistant

## 2021-09-05 ENCOUNTER — Telehealth: Payer: Self-pay | Admitting: Physician Assistant

## 2021-09-05 NOTE — Telephone Encounter (Signed)
[facility] [phone]) left a message on office voice mail that the prescription for Opzelura Ruxolitinib Phosphate (OPZELURA) 1.5 % CREAM  Needs a prior authorization done.

## 2021-09-08 NOTE — Telephone Encounter (Signed)
PA Case: [id number], Status: Closed, Closed Reason Code: FM Product not covered by this plan. Prior Authorization not available., Closed Rationale: [employer] [facility] Prior Authorization team is unable to review this product for a coverage determination as the requested medication is not on the patient's formulary. Please reach out to [facility] [employer] directly for this request. Thank you in advance.. Questions? Contact [phone]

## 2021-09-09 ENCOUNTER — Other Ambulatory Visit: Payer: Self-pay | Admitting: Internal Medicine

## 2021-09-09 ENCOUNTER — Encounter: Payer: 59 | Admitting: Neurology

## 2021-09-09 DIAGNOSIS — F418 Other specified anxiety disorders: Secondary | ICD-10-CM

## 2021-09-17 ENCOUNTER — Ambulatory Visit: Payer: 59 | Admitting: Pulmonary Disease

## 2021-09-17 ENCOUNTER — Ambulatory Visit: Payer: 59 | Admitting: Internal Medicine

## 2021-09-17 ENCOUNTER — Encounter: Payer: Self-pay | Admitting: Internal Medicine

## 2021-09-17 NOTE — Progress Notes (Deleted)
Synopsis: Referred in August 2023 for dyspnea and emphysema.  She has a history of pulmonary embolism in 2021 after a plane flight to [facility].  Subjective:   PATIENT ID: Kimberly Rocha GENDER: female DOB: 1976-01-08, MRN: [id number]   HPI  No chief complaint on file.   *** Record review: July 2023 internal medicine record reviewed where she was seen for anxiety, treated with Vistaril, endocrinology note reviewed where hypercholesterolemia and hypertension were managed.  Past Medical History:  Diagnosis Date   Borderline high cholesterol    Borderline hypertension    Gallstones    Thyroid disease      Family History  Problem Relation Age of Onset   COPD Mother    COPD Father    HIV/AIDS Brother    Stroke Maternal Grandmother    Other Daughter        has issues with cervix     Social History   Socioeconomic History   Marital status: Married    Spouse name: Not on file   Number of children: Not on file   Years of education: Not on file   Highest education level: Not on file  Occupational History   Not on file  Tobacco Use   Smoking status: Former    Packs/day: 0.50    Types: Cigarettes   Smokeless tobacco: Never  Vaping Use   Vaping Use: Former  Substance and Sexual Activity   Alcohol use: Yes    Comment: occ   Drug use: Yes    Frequency: 3.0 times per week    Types: Marijuana    Comment: twice a day   Sexual activity: Yes    Birth control/protection: Surgical    Comment: tubal  Other Topics Concern   Not on file  Social History Narrative   Not on file   Social Determinants of Health   Financial Resource Strain: Medium Risk (09/09/2020)   Overall Financial Resource Strain (CARDIA)    Difficulty of Paying Living Expenses: Somewhat hard  Food Insecurity: Food Insecurity Present (09/09/2020)   Hunger Vital Sign    Worried About Running Out of Food in the Last Year: Sometimes true    Ran Out of Food in the Last Year: Sometimes true  Transportation  Needs: No Transportation Needs (09/09/2020)   PRAPARE - Administrator, Civil Service (Medical): No    Lack of Transportation (Non-Medical): No  Physical Activity: Insufficiently Active (09/09/2020)   Exercise Vital Sign    Days of Exercise per Week: 2 days    Minutes of Exercise per Session: 20 min  Stress: Stress Concern Present (09/09/2020)   [employer] of Occupational Health - Occupational Stress Questionnaire    Feeling of Stress : Very much  Social Connections: Moderately Isolated (09/09/2020)   Social Connection and Isolation Panel [NHANES]    Frequency of Communication with Friends and Family: More than three times a week    Frequency of Social Gatherings with Friends and Family: Twice a week    Attends Religious Services: More than 4 times per year    Active Member of [employer] or Organizations: No    Attends Banker Meetings: Never    Marital Status: Never married  Intimate Partner Violence: Not At Risk (09/09/2020)   Humiliation, Afraid, Rape, and Kick questionnaire    Fear of Current or Ex-Partner: No    Emotionally Abused: No    Physically Abused: No    Sexually Abused: No  Allergies  Allergen Reactions   Demerol Hives and Itching     Outpatient Medications Prior to Visit  Medication Sig Dispense Refill   Adalimumab (HUMIRA PEN) 40 MG/0.4ML PNKT Inject 40 mg into the skin once a week. Starting on day 29. 2 each 6   Adalimumab (HUMIRA PEN-CD/UC/HS STARTER) 80 MG/0.8ML PNKT Inject 160 mg into the skin as directed. Inject 160 mg subcutaneous on day 1, then 80 mg subcutaneous on day 15. 1 each 0   ALPRAZolam (XANAX) 0.5 MG tablet Take 1 tablet (0.5 mg total) by mouth 2 (two) times daily as needed for anxiety. 30 tablet 1   augmented betamethasone dipropionate (DIPROLENE-AF) 0.05 % cream Apply topically as needed.     clindamycin (CLEOCIN T) 1 % lotion Apply topically every other day.     Clobetasol Prop Emollient Base (CLOBETASOL PROPIONATE E)  0.05 % emollient cream Apply to affected area qd 60 g 9   doxycycline (VIBRA-TABS) 100 MG tablet Take 1 tablet (100 mg total) by mouth in the morning and at bedtime. 20 tablet 0   FLUoxetine (PROZAC) 10 MG tablet Take 1 tablet (10 mg total) by mouth daily. 30 tablet 3   HYDROcodone-acetaminophen (NORCO/VICODIN) 5-325 MG tablet One tablet every six hours for pain.  Limit 7 days. 28 tablet 0   hydrOXYzine (VISTARIL) 50 MG capsule TAKE 1 CAPSULE(50 MG) BY MOUTH THREE TIMES DAILY AS NEEDED 90 capsule 0   levothyroxine (SYNTHROID) 75 MCG tablet Take 1 tablet (75 mcg total) by mouth daily before breakfast. 90 tablet 1   progesterone (PROMETRIUM) 100 MG capsule TAKE 1 CAPSULE(100 MG) BY MOUTH AT BEDTIME 30 capsule 2   Ruxolitinib Phosphate (OPZELURA) 1.5 % CREA Apply to affected area qd 60 g 6   triamcinolone cream (KENALOG) 0.1 % Apply 1 Application topically 2 (two) times daily. 30 g 0   No facility-administered medications prior to visit.    ROS    Objective:  Physical Exam   There were no vitals filed for this visit.  ***  CBC    Component Value Date/Time   WBC 8.0 07/09/2021 1057   RBC 3.49 (L) 07/09/2021 1057   HGB 12.3 07/09/2021 1057   HGB 12.3 08/29/2020 1000   HCT 35.3 07/09/2021 1057   HCT 36.1 08/29/2020 1000   PLT 135 (L) 07/09/2021 1057   PLT 209 08/29/2020 1000   MCV 101.1 (H) 07/09/2021 1057   MCV 98 (H) 08/29/2020 1000   MCH 35.2 (H) 07/09/2021 1057   MCHC 34.8 07/09/2021 1057   RDW 13.2 07/09/2021 1057   RDW 13.1 08/29/2020 1000   LYMPHSABS 2,528 07/09/2021 1057   LYMPHSABS 3.0 08/29/2020 1000   MONOABS 0.6 04/21/2020 1055   EOSABS 24 07/09/2021 1057   EOSABS 0.0 08/29/2020 1000   BASOSABS 40 07/09/2021 1057   BASOSABS 0.0 08/29/2020 1000     Chest imaging: March 2022 CT angiogram chest images independently reviewed showing no pulmonary embolism, mild centrilobular emphysema in the upper lobes bilaterally June 2023 chest x-ray images independently  reviewed showing some hyperinflation and flattening of the diaphragms, otherwise normal pulmonary parenchyma  PFT:  Labs:  Path:  Echo:  Heart Catheterization:       Assessment & Plan:   No diagnosis found.  Discussion: ***    Current Outpatient Medications:    Adalimumab (HUMIRA PEN) 40 MG/0.4ML PNKT, Inject 40 mg into the skin once a week. Starting on day 29., Disp: 2 each, Rfl: 6   Adalimumab (HUMIRA PEN-CD/UC/HS  STARTER) 80 MG/0.8ML PNKT, Inject 160 mg into the skin as directed. Inject 160 mg subcutaneous on day 1, then 80 mg subcutaneous on day 15., Disp: 1 each, Rfl: 0   ALPRAZolam (XANAX) 0.5 MG tablet, Take 1 tablet (0.5 mg total) by mouth 2 (two) times daily as needed for anxiety., Disp: 30 tablet, Rfl: 1   augmented betamethasone dipropionate (DIPROLENE-AF) 0.05 % cream, Apply topically as needed., Disp: , Rfl:    clindamycin (CLEOCIN T) 1 % lotion, Apply topically every other day., Disp: , Rfl:    Clobetasol Prop Emollient Base (CLOBETASOL PROPIONATE E) 0.05 % emollient cream, Apply to affected area qd, Disp: 60 g, Rfl: 9   doxycycline (VIBRA-TABS) 100 MG tablet, Take 1 tablet (100 mg total) by mouth in the morning and at bedtime., Disp: 20 tablet, Rfl: 0   FLUoxetine (PROZAC) 10 MG tablet, Take 1 tablet (10 mg total) by mouth daily., Disp: 30 tablet, Rfl: 3   HYDROcodone-acetaminophen (NORCO/VICODIN) 5-325 MG tablet, One tablet every six hours for pain.  Limit 7 days., Disp: 28 tablet, Rfl: 0   hydrOXYzine (VISTARIL) 50 MG capsule, TAKE 1 CAPSULE(50 MG) BY MOUTH THREE TIMES DAILY AS NEEDED, Disp: 90 capsule, Rfl: 0   levothyroxine (SYNTHROID) 75 MCG tablet, Take 1 tablet (75 mcg total) by mouth daily before breakfast., Disp: 90 tablet, Rfl: 1   progesterone (PROMETRIUM) 100 MG capsule, TAKE 1 CAPSULE(100 MG) BY MOUTH AT BEDTIME, Disp: 30 capsule, Rfl: 2   Ruxolitinib Phosphate (OPZELURA) 1.5 % CREA, Apply to affected area qd, Disp: 60 g, Rfl: 6   triamcinolone cream  (KENALOG) 0.1 %, Apply 1 Application topically 2 (two) times daily., Disp: 30 g, Rfl: 0

## 2021-09-18 ENCOUNTER — Encounter: Payer: Self-pay | Admitting: Internal Medicine

## 2021-09-18 ENCOUNTER — Ambulatory Visit (INDEPENDENT_AMBULATORY_CARE_PROVIDER_SITE_OTHER): Payer: 59 | Admitting: Internal Medicine

## 2021-09-18 VITALS — BP 122/84 | HR 86 | Resp 18 | Ht 60.0 in | Wt 109.8 lb

## 2021-09-18 DIAGNOSIS — L732 Hidradenitis suppurativa: Secondary | ICD-10-CM

## 2021-09-18 DIAGNOSIS — E0590 Thyrotoxicosis, unspecified without thyrotoxic crisis or storm: Secondary | ICD-10-CM | POA: Diagnosis not present

## 2021-09-18 DIAGNOSIS — Z79899 Other long term (current) drug therapy: Secondary | ICD-10-CM

## 2021-09-18 DIAGNOSIS — F418 Other specified anxiety disorders: Secondary | ICD-10-CM

## 2021-09-18 DIAGNOSIS — Z23 Encounter for immunization: Secondary | ICD-10-CM

## 2021-09-18 DIAGNOSIS — F410 Panic disorder [episodic paroxysmal anxiety] without agoraphobia: Secondary | ICD-10-CM

## 2021-09-18 MED ORDER — FLUOXETINE HCL 20 MG PO TABS: 20.0000 mg | ORAL_TABLET | Freq: Every day | ORAL | 3 refills | Status: DC

## 2021-09-18 NOTE — Progress Notes (Signed)
Established Patient Office Visit  Subjective:  Patient ID: Kimberly Rocha, female    DOB: December 02, 1975  Age: 46 y.o. MRN: [id number]  CC:  Chief Complaint  Patient presents with   Follow-up    Follow up GAD has never heard from [facility] referral     HPI Kimberly Rocha is a 46 y.o. female with past medical history of PE (11/2019), hypothyroidism, seizure disorder, ?herpes simplex and anxiety with depression who presents for f/u of her chronic medical conditions.  She has been taking Prozac and Vistaril for anxiety.  She was placed on Xanax as needed for panic episodes as well. She was on Lexapro and Remeron in the past, but had severe night sweats with them. She has h/o depression and suicidal ideations in the past. She denies any acute stressors. She has had panic episodes, where she feels short of breath and has severe flushing with palpitations. She was referred to [facility] in [place], but has not heard from them yet.  Postablative hypothyroidism: She has been taking levothyroxine 75 mcg daily.  Followed by endocrinology.  Hydradenitis suppurativa and alopecia: She is on Humira for hidradenitis suppurativa, used to follow up with [place] [facility].  She uses Opzelura cream for alopecia.  Past Medical History:  Diagnosis Date   Borderline high cholesterol    Borderline hypertension    Gallstones    Thyroid disease     Past Surgical History:  Procedure Laterality Date   CESAREAN SECTION     CHOLECYSTECTOMY N/A 08/22/2012   Procedure: LAPAROSCOPIC CHOLECYSTECTOMY;  Surgeon: Jamesetta So, MD;  Location: [facility];  Service: General;  Laterality: N/A;   GALLBLADDER SURGERY     HERNIA REPAIR     TUBAL LIGATION      Family History  Problem Relation Age of Onset   COPD Mother    COPD Father    HIV/AIDS Brother    Stroke Maternal Grandmother    Other Daughter        has issues with cervix    Social History   Socioeconomic History   Marital status: Married    Spouse name:  Not on file   Number of children: Not on file   Years of education: Not on file   Highest education level: Not on file  Occupational History   Not on file  Tobacco Use   Smoking status: Former    Packs/day: 0.50    Types: Cigarettes   Smokeless tobacco: Never  Vaping Use   Vaping Use: Former  Substance and Sexual Activity   Alcohol use: Yes    Comment: occ   Drug use: Yes    Frequency: 3.0 times per week    Types: Marijuana    Comment: twice a day   Sexual activity: Yes    Birth control/protection: Surgical    Comment: tubal  Other Topics Concern   Not on file  Social History Narrative   Not on file   Social Determinants of Health   Financial Resource Strain: Medium Risk (09/09/2020)   Overall Financial Resource Strain (CARDIA)    Difficulty of Paying Living Expenses: Somewhat hard  Food Insecurity: Food Insecurity Present (09/09/2020)   Hunger Vital Sign    Worried About Running Out of Food in the Last Year: Sometimes true    Ran Out of Food in the Last Year: Sometimes true  Transportation Needs: No Transportation Needs (09/09/2020)   PRAPARE - Hydrologist (Medical): No  Lack of Transportation (Non-Medical): No  Physical Activity: Insufficiently Active (09/09/2020)   Exercise Vital Sign    Days of Exercise per Week: 2 days    Minutes of Exercise per Session: 20 min  Stress: Stress Concern Present (09/09/2020)   [place]    Feeling of Stress : Very much  Social Connections: Moderately Isolated (09/09/2020)   Social Connection and Isolation Panel [NHANES]    Frequency of Communication with Friends and Family: More than three times a week    Frequency of Social Gatherings with Friends and Family: Twice a week    Attends Religious Services: More than 4 times per year    Active Member of [employer] or Organizations: No    Attends Archivist Meetings: Never    Marital  Status: Never married  Intimate Partner Violence: Not At Risk (09/09/2020)   Humiliation, Afraid, Rape, and Kick questionnaire    Fear of Current or Ex-Partner: No    Emotionally Abused: No    Physically Abused: No    Sexually Abused: No    Outpatient Medications Prior to Visit  Medication Sig Dispense Refill   Adalimumab (HUMIRA PEN) 40 MG/0.4ML PNKT Inject 40 mg into the skin once a week. Starting on day 29. 2 each 6   Adalimumab (HUMIRA PEN-CD/UC/HS STARTER) 80 MG/0.8ML PNKT Inject 160 mg into the skin as directed. Inject 160 mg subcutaneous on day 1, then 80 mg subcutaneous on day 15. 1 each 0   ALPRAZolam (XANAX) 0.5 MG tablet Take 1 tablet (0.5 mg total) by mouth 2 (two) times daily as needed for anxiety. 30 tablet 1   augmented betamethasone dipropionate (DIPROLENE-AF) 0.05 % cream Apply topically as needed.     clindamycin (CLEOCIN T) 1 % lotion Apply topically every other day.     Clobetasol Prop Emollient Base (CLOBETASOL PROPIONATE E) 0.05 % emollient cream Apply to affected area qd 60 g 9   HYDROcodone-acetaminophen (NORCO/VICODIN) 5-325 MG tablet One tablet every six hours for pain.  Limit 7 days. 28 tablet 0   hydrOXYzine (VISTARIL) 50 MG capsule TAKE 1 CAPSULE(50 MG) BY MOUTH THREE TIMES DAILY AS NEEDED 90 capsule 0   levothyroxine (SYNTHROID) 75 MCG tablet Take 1 tablet (75 mcg total) by mouth daily before breakfast. 90 tablet 1   progesterone (PROMETRIUM) 100 MG capsule TAKE 1 CAPSULE(100 MG) BY MOUTH AT BEDTIME 30 capsule 2   Ruxolitinib Phosphate (OPZELURA) 1.5 % CREA Apply to affected area qd 60 g 6   triamcinolone cream (KENALOG) 0.1 % Apply 1 Application topically 2 (two) times daily. 30 g 0   FLUoxetine (PROZAC) 10 MG tablet Take 1 tablet (10 mg total) by mouth daily. 30 tablet 3   doxycycline (VIBRA-TABS) 100 MG tablet Take 1 tablet (100 mg total) by mouth in the morning and at bedtime. (Patient not taking: Reported on 09/18/2021) 20 tablet 0   No facility-administered  medications prior to visit.    Allergies  Allergen Reactions   Demerol Hives and Itching    ROS Review of Systems  Constitutional:  Positive for fatigue. Negative for chills and fever.  HENT:  Negative for congestion, sinus pressure, sinus pain and sore throat.   Eyes:  Negative for pain and discharge.  Respiratory:  Negative for cough and shortness of breath.   Cardiovascular:  Negative for chest pain and palpitations.  Gastrointestinal:  Negative for abdominal pain, diarrhea, nausea and vomiting.  Endocrine: Negative for polydipsia and polyuria.  Genitourinary:  Negative for dysuria and hematuria.  Musculoskeletal:  Positive for arthralgias, back pain and neck pain. Negative for neck stiffness.  Skin:  Positive for rash.  Neurological:  Positive for dizziness, weakness, numbness and headaches.  Psychiatric/Behavioral:  Positive for agitation, dysphoric mood and sleep disturbance. Negative for behavioral problems. The patient is nervous/anxious.       Objective:    Physical Exam Vitals reviewed.  Constitutional:      General: She is not in acute distress.    Appearance: She is not diaphoretic.  HENT:     Head: Normocephalic and atraumatic.     Nose: Nose normal.     Mouth/Throat:     Mouth: Mucous membranes are moist.  Eyes:     General: No scleral icterus.    Extraocular Movements: Extraocular movements intact.  Cardiovascular:     Rate and Rhythm: Normal rate and regular rhythm.     Pulses: Normal pulses.     Heart sounds: Normal heart sounds. No murmur heard. Pulmonary:     Breath sounds: Normal breath sounds. No wheezing or rales.  Musculoskeletal:     Cervical back: Neck supple. No tenderness.     Right lower leg: No edema.     Left lower leg: No edema.  Skin:    General: Skin is warm.  Neurological:     General: No focal deficit present.     Mental Status: She is alert and oriented to person, place, and time.     Sensory: Sensory deficit (Bilateral hands)  present.     Motor: Weakness (4/5 in left UE) present.  Psychiatric:        Mood and Affect: Mood normal.        Behavior: Behavior normal.     BP 122/84 (BP Location: Right Arm, Patient Position: Sitting, Cuff Size: Normal)   Pulse 86   Resp 18   Ht 5' (1.524 m)   Wt 109 lb 12.8 oz (49.8 kg)   SpO2 100%   BMI 21.44 kg/m  Wt Readings from Last 3 Encounters:  09/18/21 109 lb 12.8 oz (49.8 kg)  09/02/21 112 lb (50.8 kg)  06/02/21 119 lb (54 kg)    Lab Results  Component Value Date   TSH 2.460 08/25/2021   Lab Results  Component Value Date   WBC 8.0 07/09/2021   HGB 12.3 07/09/2021   HCT 35.3 07/09/2021   MCV 101.1 (H) 07/09/2021   PLT 135 (L) 07/09/2021   Lab Results  Component Value Date   NA 141 07/09/2021   K 4.2 07/09/2021   CO2 21 07/09/2021   GLUCOSE 83 07/09/2021   BUN 11 07/09/2021   CREATININE 1.04 (H) 07/09/2021   BILITOT 0.4 07/09/2021   ALKPHOS 79 07/25/2020   AST 16 07/09/2021   ALT 18 07/09/2021   PROT 6.5 07/09/2021   ALBUMIN 4.5 07/25/2020   CALCIUM 8.9 07/09/2021   ANIONGAP 8 04/21/2020   EGFR 89 07/25/2020   Lab Results  Component Value Date   CHOL 233 (H) 07/25/2020   Lab Results  Component Value Date   HDL 47 07/25/2020   Lab Results  Component Value Date   LDLCALC 175 (H) 07/25/2020   Lab Results  Component Value Date   TRIG 63 07/25/2020   Lab Results  Component Value Date   CHOLHDL 5.0 (H) 07/25/2020   No results found for: "HGBA1C"    Assessment & Plan:   Problem List Items Addressed This Visit  Endocrine   Hyperthyroidism - Primary     Musculoskeletal and Integument   Hidradenitis suppurativa    On Humira Has clindamycin lotion Used to follow up with Dermatology        Other   Anxiety with depression    Uncontrolled with Prozac 10 mg QD, increased dose to 20 mg QD and Vistaril 50 mg TID PRN Referred to psychiatry/BH therapy Xanax as needed for panic disorder Current hypothyroidism can also  lead to anhedonia and fatigue, followed by endocrinology      Relevant Medications   FLUoxetine (PROZAC) 20 MG tablet   Other Relevant Orders   Ambulatory referral to Psychiatry   Panic disorder    Xanax as needed for panic episodes      Relevant Medications   FLUoxetine (PROZAC) 20 MG tablet   Other Relevant Orders   Ambulatory referral to Psychiatry   ToxASSURE Select 13 (MW), Urine   Other Visit Diagnoses     Chronic prescription benzodiazepine use       Relevant Orders   ToxASSURE Select 13 (MW), Urine   Need for Tdap vaccination       Relevant Orders   Tdap vaccine greater than or equal to 7yo IM (Completed)   Need for immunization against influenza       Relevant Orders   Flu Vaccine QUAD 92moIM (Fluarix, Fluzone & Alfiuria Quad PF) (Completed)       Meds ordered this encounter  Medications   FLUoxetine (PROZAC) 20 MG tablet    Sig: Take 1 tablet (20 mg total) by mouth daily.    Dispense:  30 tablet    Refill:  3    Dose change    Follow-up: Return in about 3 months (around 12/19/2021) for Annual physical.    RLindell Spar MD

## 2021-09-18 NOTE — Assessment & Plan Note (Signed)
Xanax as needed for panic episodes

## 2021-09-18 NOTE — Assessment & Plan Note (Signed)
Uncontrolled with Prozac 10 mg QD, increased dose to 20 mg QD and Vistaril 50 mg TID PRN Referred to psychiatry/BH therapy Xanax as needed for panic disorder Current hypothyroidism can also lead to anhedonia and fatigue, followed by endocrinology

## 2021-09-18 NOTE — Patient Instructions (Signed)
Please start taking Fluoxetine 20 mg instead of 10 mg.  Please continue taking other medications as prescribed.

## 2021-09-18 NOTE — Assessment & Plan Note (Signed)
On Humira Has clindamycin lotion Used to follow up with Dermatology

## 2021-09-19 ENCOUNTER — Other Ambulatory Visit: Payer: Self-pay | Admitting: Internal Medicine

## 2021-09-19 ENCOUNTER — Other Ambulatory Visit: Payer: Self-pay

## 2021-09-19 ENCOUNTER — Encounter: Payer: Self-pay | Admitting: Internal Medicine

## 2021-09-19 ENCOUNTER — Telehealth: Payer: Self-pay

## 2021-09-19 DIAGNOSIS — F410 Panic disorder [episodic paroxysmal anxiety] without agoraphobia: Secondary | ICD-10-CM

## 2021-09-19 DIAGNOSIS — F418 Other specified anxiety disorders: Secondary | ICD-10-CM

## 2021-09-19 MED ORDER — FLUOXETINE HCL 20 MG PO CAPS: 20.0000 mg | ORAL_CAPSULE | Freq: Every day | ORAL | 3 refills | Status: DC

## 2021-09-19 MED ORDER — ALPRAZOLAM 0.5 MG PO TABS: 0.5000 mg | ORAL_TABLET | Freq: Two times a day (BID) | ORAL | 2 refills | Status: DC | PRN

## 2021-09-19 NOTE — Addendum Note (Signed)
Addended byTrena Platt on: 09/19/2021 11:49 AM   Modules accepted: Orders

## 2021-09-19 NOTE — Telephone Encounter (Signed)
Patient called said she was already on the FLUoxetine (PROZAC) 20 MG capsule  And also needs the ALPRAZolam Prudy Feeler) 0.5 MG tablet called into her pharmacy.  Patient  is sitting at [facility] waiting on her Xanax can not do with out it.   [facility] [address] [place]

## 2021-09-19 NOTE — Telephone Encounter (Signed)
LMTRC

## 2021-09-23 ENCOUNTER — Ambulatory Visit: Payer: 59 | Admitting: Orthopaedic Surgery

## 2021-09-23 ENCOUNTER — Encounter: Payer: Self-pay | Admitting: Orthopaedic Surgery

## 2021-09-24 ENCOUNTER — Ambulatory Visit: Payer: 59 | Admitting: Physician Assistant

## 2021-09-24 LAB — TOXASSURE SELECT 13 (MW), URINE

## 2021-09-26 ENCOUNTER — Ambulatory Visit: Payer: 59 | Admitting: Pulmonary Disease

## 2021-10-03 ENCOUNTER — Other Ambulatory Visit: Payer: Self-pay | Admitting: Internal Medicine

## 2021-10-03 DIAGNOSIS — R232 Flushing: Secondary | ICD-10-CM

## 2021-10-10 ENCOUNTER — Telehealth: Payer: Self-pay | Admitting: Internal Medicine

## 2021-10-10 ENCOUNTER — Other Ambulatory Visit: Payer: Self-pay | Admitting: *Deleted

## 2021-10-10 DIAGNOSIS — Z8619 Personal history of other infectious and parasitic diseases: Secondary | ICD-10-CM

## 2021-10-10 DIAGNOSIS — L732 Hidradenitis suppurativa: Secondary | ICD-10-CM

## 2021-10-10 DIAGNOSIS — L309 Dermatitis, unspecified: Secondary | ICD-10-CM

## 2021-10-10 NOTE — Telephone Encounter (Signed)
Per Dr Allena Katz we do not fill this medication.  He will take the form and look over this it may take a few days but he will discuss with patient after he looks over this medication.   Spoke with patient and let her know that we do not fill this and she was upset that Dr Allena Katz would not fill out this form. Let her know that Dr Allena Katz would look over the form and see if we could get this filled out but it may be a few days. Patient advised with verbal understanding

## 2021-10-10 NOTE — Telephone Encounter (Signed)
Called patient left voice mail form is ready for pick up.  Will let patient know when the form is picked up that patient needs to sign and complete her section.  Sent a copy to scan department. Original copy put in brown folder for patient.

## 2021-10-10 NOTE — Telephone Encounter (Signed)
Spoke with patient in regard to Humira Pen   Can not be filled by Chrissie Noa , [facility] has closed. Patient does not know where provider will be going. Prescribing provider sent in extra refills due to closing of practice and patient has now ran out of med. States that primary should be able to take over since the closing of prescribing office.   Patient wants a call back.    Form is in provider box.

## 2021-10-14 ENCOUNTER — Ambulatory Visit: Payer: 59 | Admitting: Orthopaedic Surgery

## 2021-10-15 ENCOUNTER — Encounter: Payer: Self-pay | Admitting: Internal Medicine

## 2021-10-17 ENCOUNTER — Encounter: Payer: Self-pay | Admitting: Internal Medicine

## 2021-10-18 ENCOUNTER — Encounter: Payer: Self-pay | Admitting: Nurse Practitioner

## 2021-10-20 NOTE — Telephone Encounter (Signed)
Can we have her get her labs done now and see her sooner please?  Maybe next week since this week is packed.

## 2021-10-21 ENCOUNTER — Telehealth: Payer: Self-pay

## 2021-10-21 NOTE — Telephone Encounter (Signed)
Financial Assistance with Opzelura  Copied Noted Sleeved  Will need to be faxed once completed [phone] and patient to pick up a copy.

## 2021-10-22 LAB — T4, FREE: Free T4: 1.31 ng/dL (ref 0.82–1.77)

## 2021-10-22 LAB — TSH: TSH: 0.955 u[IU]/mL (ref 0.450–4.500)

## 2021-10-22 NOTE — Progress Notes (Signed)
Patient was called and a message with her results were left on her voicemail. Patient was advised , if she had any questions or concerns to send [place] a message through My Chart or call the office.

## 2021-10-22 NOTE — Telephone Encounter (Signed)
Faxed forms and left voicemail for patient pick up a copy.

## 2021-10-22 NOTE — Progress Notes (Signed)
Please let her know that her thyroid labs look wonderful, right at target!  I am not sure the thyroid is what is causing her recent symptoms.  I want her to follow up with her PCP as well to help look for other potential causes for her recent symptoms since her thyroid is at goal.

## 2021-10-24 ENCOUNTER — Encounter: Payer: Self-pay | Admitting: Nurse Practitioner

## 2021-10-28 ENCOUNTER — Telehealth: Payer: Self-pay | Admitting: *Deleted

## 2021-10-28 ENCOUNTER — Ambulatory Visit: Payer: 59 | Admitting: Orthopaedic Surgery

## 2021-10-28 NOTE — Telephone Encounter (Signed)
Kimberly Rocha received a denial letter for opzelura patient assistance we will need the form back to try to resubmit to try to get approval will let her know when she calls back

## 2021-10-30 ENCOUNTER — Ambulatory Visit (INDEPENDENT_AMBULATORY_CARE_PROVIDER_SITE_OTHER): Payer: Medicaid Other | Admitting: Pulmonary Disease

## 2021-10-30 ENCOUNTER — Encounter: Payer: Self-pay | Admitting: Pulmonary Disease

## 2021-10-30 VITALS — BP 128/82 | HR 74 | Ht 60.0 in | Wt 113.2 lb

## 2021-10-30 DIAGNOSIS — J432 Centrilobular emphysema: Secondary | ICD-10-CM | POA: Diagnosis not present

## 2021-10-30 DIAGNOSIS — R0609 Other forms of dyspnea: Secondary | ICD-10-CM | POA: Diagnosis not present

## 2021-10-30 DIAGNOSIS — Z86711 Personal history of pulmonary embolism: Secondary | ICD-10-CM

## 2021-10-30 DIAGNOSIS — Z23 Encounter for immunization: Secondary | ICD-10-CM

## 2021-10-30 NOTE — Patient Instructions (Addendum)
Centrilobular emphysema: Spirometry testing Ambulatory oximetry testing Start Anoro 1 puff daily no matter how you feel Albuterol 2 puffs every 4-6 hours as needed for chest tightness wheezing or shortness of breath Stay physically active Practice good hand hygiene You will need to have pneumonia vaccinations: prevnar today, pneumovax next visit.  Follow-up in 4 to 6 weeks or sooner if needed

## 2021-10-30 NOTE — Progress Notes (Signed)
Synopsis: Referred in October 2023 for dyspnea on exertion.  She has a past medical history significant for segmental pulmonary embolism with a normal echocardiogram in November 2021 after a plane trip.  Also has a history of smoking cigarettes.  Subjective:   PATIENT ID: [name] GENDER: female DOB: [birth year], MRN: [id number]   HPI  Chief Complaint  Patient presents with   Establish Care    Former MW for emphysema. States she has not been able to walk long distances due to increased SOB. Increased wheezing for the past few weeks, possibly due to the weather change. Denies any cough.     [name] is here to see me for emphysema. She was diagnosed with a blood clot in 06/2019 and around that time she was found to have emphysema. She says that she feels more fatigue and dyspnea than she used to with prior exertion.  She will get short of breath to get her mail and she feels short of breath.  This started after the blood clots.  She has trouble with wheezing in the last few weeks since the weather has changed.  No phlegm or sputum production unless she has sinus congestion.  She'll rarely get some chest tightness. If she gets up too quickly she'll have a brief amount of chest pain, but this is fairly rare.    No cases of bronchitis that she is aware of.   No childhood respiratory illnesses.    Her past medical history is fairly unremarkable, had gallstones removed and thyroid ablation for Grave's disease.  Smoked 1/2 ppd from late 20's through age 46.  She says that she will get an alert from her smart watch telling her to take deep breaths.  Upon review of the application sending these alerts it seems that her O2 saturation is being monitored by her watch.  I cannot tell exactly the cutoff value for these alerts.  Record review: Records from primary care visit in 2023 reviewed where the patient was seen in the setting of anxiety and depression which was managed with Restoril.  The  patient has a history of hidradenitis suppurativa as well as alopecia, currently on Humira.  Has a history of pulmonary embolism  Past Medical History:  Diagnosis Date   Borderline high cholesterol    Borderline hypertension    Gallstones    Thyroid disease      Family History  Problem Relation Age of Onset   COPD Mother    COPD Father    HIV/AIDS Brother    Stroke Maternal Grandmother    Other Daughter        has issues with cervix     Social History   Socioeconomic History   Marital status: Married    Spouse name: Not on file   Number of children: Not on file   Years of education: Not on file   Highest education level: Not on file  Occupational History   Not on file  Tobacco Use   Smoking status: Former    Packs/day: 0.50    Types: Cigarettes   Smokeless tobacco: Never  Vaping Use   Vaping Use: Former  Substance and Sexual Activity   Alcohol use: Yes    Comment: occ   Drug use: Yes    Frequency: 3.0 times per week    Types: Marijuana    Comment: twice a day   Sexual activity: Yes    Birth control/protection: Surgical    Comment: tubal  Other  Topics Concern   Not on file  Social History Narrative   Not on file   Social Determinants of Health   Financial Resource Strain: Medium Risk (09/09/2020)   Overall Financial Resource Strain (CARDIA)    Difficulty of Paying Living Expenses: Somewhat hard  Food Insecurity: Food Insecurity Present (09/09/2020)   Hunger Vital Sign    Worried About Running Out of Food in the Last Year: Sometimes true    Ran Out of Food in the Last Year: Sometimes true  Transportation Needs: No Transportation Needs (09/09/2020)   PRAPARE - Administrator, Civil Service (Medical): No    Lack of Transportation (Non-Medical): No  Physical Activity: Insufficiently Active (09/09/2020)   Exercise Vital Sign    Days of Exercise per Week: 2 days    Minutes of Exercise per Session: 20 min  Stress: Stress Concern Present (09/09/2020)    [employer] of Occupational Health - Occupational Stress Questionnaire    Feeling of Stress : Very much  Social Connections: Moderately Isolated (09/09/2020)   Social Connection and Isolation Panel [NHANES]    Frequency of Communication with Friends and Family: More than three times a week    Frequency of Social Gatherings with Friends and Family: Twice a week    Attends Religious Services: More than 4 times per year    Active Member of [employer] or Organizations: No    Attends Banker Meetings: Never    Marital Status: Never married  Intimate Partner Violence: Not At Risk (09/09/2020)   Humiliation, Afraid, Rape, and Kick questionnaire    Fear of Current or Ex-Partner: No    Emotionally Abused: No    Physically Abused: No    Sexually Abused: No     Allergies  Allergen Reactions   Demerol Hives and Itching     Outpatient Medications Prior to Visit  Medication Sig Dispense Refill   Adalimumab (HUMIRA PEN) 40 MG/0.4ML PNKT Inject 40 mg into the skin once a week. Starting on day 29. 2 each 6   Adalimumab (HUMIRA PEN-CD/UC/HS STARTER) 80 MG/0.8ML PNKT Inject 160 mg into the skin as directed. Inject 160 mg subcutaneous on day 1, then 80 mg subcutaneous on day 15. 1 each 0   ALPRAZolam (XANAX) 0.5 MG tablet Take 1 tablet (0.5 mg total) by mouth 2 (two) times daily as needed for anxiety. 30 tablet 2   augmented betamethasone dipropionate (DIPROLENE-AF) 0.05 % cream Apply topically as needed.     clindamycin (CLEOCIN T) 1 % lotion Apply topically every other day.     Clobetasol Prop Emollient Base (CLOBETASOL PROPIONATE E) 0.05 % emollient cream Apply to affected area qd 60 g 9   FLUoxetine (PROZAC) 20 MG capsule Take 1 capsule (20 mg total) by mouth daily. 30 capsule 3   HYDROcodone-acetaminophen (NORCO/VICODIN) 5-325 MG tablet One tablet every six hours for pain.  Limit 7 days. 28 tablet 0   hydrOXYzine (VISTARIL) 50 MG capsule TAKE 1 CAPSULE(50 MG) BY MOUTH THREE TIMES  DAILY AS NEEDED 90 capsule 0   levothyroxine (SYNTHROID) 75 MCG tablet Take 1 tablet (75 mcg total) by mouth daily before breakfast. 90 tablet 1   progesterone (PROMETRIUM) 100 MG capsule TAKE 1 CAPSULE(100 MG) BY MOUTH AT BEDTIME 30 capsule 2   Ruxolitinib Phosphate (OPZELURA) 1.5 % CREA Apply to affected area qd 60 g 6   triamcinolone cream (KENALOG) 0.1 % Apply 1 Application topically 2 (two) times daily. 30 g 0   No facility-administered  medications prior to visit.    Review of Systems  Constitutional:  Negative for chills, fever, malaise/fatigue and weight loss.  HENT:  Negative for congestion, nosebleeds, sinus pain and sore throat.   Eyes:  Negative for photophobia, pain and discharge.  Respiratory:  Positive for cough, shortness of breath and wheezing. Negative for hemoptysis and sputum production.   Cardiovascular:  Negative for chest pain, palpitations, orthopnea and leg swelling.  Gastrointestinal:  Negative for abdominal pain, constipation, diarrhea, nausea and vomiting.  Genitourinary:  Negative for dysuria, frequency, hematuria and urgency.  Musculoskeletal:  Negative for back pain, joint pain, myalgias and neck pain.  Skin:  Negative for itching and rash.  Neurological:  Negative for tingling, tremors, sensory change, speech change, focal weakness, seizures, weakness and headaches.  Psychiatric/Behavioral:  Negative for memory loss, substance abuse and suicidal ideas. The patient is not nervous/anxious.       Objective:  Physical Exam   Vitals:   10/30/21 1049  BP: 128/82  Pulse: 74  SpO2: 100%  Weight: 113 lb 3.2 oz (51.3 kg)  Height: 5' (1.524 m)    RA  Gen: well appearing, no acute distress HENT: NCAT, OP clear, neck supple without masses Eyes: PERRL, EOMi Lymph: no cervical lymphadenopathy PULM: CTA B CV: RRR, no mgr, no JVD GI: BS+, soft, nontender, no hsm Derm: no rash or skin breakdown MSK: normal bulk and tone Neuro: A&Ox4, CN II-XII intact,  strength 5/5 in all 4 extremities Psyche: normal mood and affect   CBC    Component Value Date/Time   WBC 8.0 07/09/2021 1057   RBC 3.49 (L) 07/09/2021 1057   HGB 12.3 07/09/2021 1057   HGB 12.3 08/29/2020 1000   HCT 35.3 07/09/2021 1057   HCT 36.1 08/29/2020 1000   PLT 135 (L) 07/09/2021 1057   PLT 209 08/29/2020 1000   MCV 101.1 (H) 07/09/2021 1057   MCV 98 (H) 08/29/2020 1000   MCH 35.2 (H) 07/09/2021 1057   MCHC 34.8 07/09/2021 1057   RDW 13.2 07/09/2021 1057   RDW 13.1 08/29/2020 1000   LYMPHSABS 2,528 07/09/2021 1057   LYMPHSABS 3.0 08/29/2020 1000   MONOABS 0.6 04/21/2020 1055   EOSABS 24 07/09/2021 1057   EOSABS 0.0 08/29/2020 1000   BASOSABS 40 07/09/2021 1057   BASOSABS 0.0 08/29/2020 1000     Chest imaging: March 2023 CT angiogram images independently reviewed showing no pulmonary embolism, mild upper lobe predominant centrilobular emphysema, normal cardiac silhouette. June 2023 chest x-ray images independently reviewed showing hyperinflation, normal cardiac silhouette, otherwise normal lung imaging.  PFT: October 2023 spirometry ratio 61%, FEV1 1.73 L 85% predicted  Labs:  Path:  Echo:  Heart Catheterization:       Assessment & Plan:   DOE (dyspnea on exertion)  History of pulmonary embolus (PE)  Centrilobular emphysema (HCC)  Discussion: Pleasant 46 year old female with centrilobular emphysema and symptoms consistent with the same.  Complicated by pulmonary embolism in the past, though I think there is little evidence right now of chronic pulmonary emboli.  Her smart watch has been giving her alerts for low O2 saturation which we will need to validate with testing today.  Plan: Centrilobular emphysema: Spirometry testing Ambulatory oximetry testing Start Anoro 1 puff daily no matter how you feel Albuterol 2 puffs every 4-6 hours as needed for chest tightness wheezing or shortness of breath Stay physically active Practice good hand  hygiene Prevnar vaccine Don't smoke  Follow-up in 4 to 6 weeks or sooner if  needed   Immunizations: Immunization History  Administered Date(s) Administered   Influenza,inj,Quad PF,6+ Mos 09/18/2021   Moderna Sars-Covid-2 Vaccination 02/27/2020, 03/27/2020   Tdap 09/18/2021     Current Outpatient Medications:    Adalimumab (HUMIRA PEN) 40 MG/0.4ML PNKT, Inject 40 mg into the skin once a week. Starting on day 29., Disp: 2 each, Rfl: 6   Adalimumab (HUMIRA PEN-CD/UC/HS STARTER) 80 MG/0.8ML PNKT, Inject 160 mg into the skin as directed. Inject 160 mg subcutaneous on day 1, then 80 mg subcutaneous on day 15., Disp: 1 each, Rfl: 0   ALPRAZolam (XANAX) 0.5 MG tablet, Take 1 tablet (0.5 mg total) by mouth 2 (two) times daily as needed for anxiety., Disp: 30 tablet, Rfl: 2   augmented betamethasone dipropionate (DIPROLENE-AF) 0.05 % cream, Apply topically as needed., Disp: , Rfl:    clindamycin (CLEOCIN T) 1 % lotion, Apply topically every other day., Disp: , Rfl:    Clobetasol Prop Emollient Base (CLOBETASOL PROPIONATE E) 0.05 % emollient cream, Apply to affected area qd, Disp: 60 g, Rfl: 9   FLUoxetine (PROZAC) 20 MG capsule, Take 1 capsule (20 mg total) by mouth daily., Disp: 30 capsule, Rfl: 3   HYDROcodone-acetaminophen (NORCO/VICODIN) 5-325 MG tablet, One tablet every six hours for pain.  Limit 7 days., Disp: 28 tablet, Rfl: 0   hydrOXYzine (VISTARIL) 50 MG capsule, TAKE 1 CAPSULE(50 MG) BY MOUTH THREE TIMES DAILY AS NEEDED, Disp: 90 capsule, Rfl: 0   levothyroxine (SYNTHROID) 75 MCG tablet, Take 1 tablet (75 mcg total) by mouth daily before breakfast., Disp: 90 tablet, Rfl: 1   progesterone (PROMETRIUM) 100 MG capsule, TAKE 1 CAPSULE(100 MG) BY MOUTH AT BEDTIME, Disp: 30 capsule, Rfl: 2   Ruxolitinib Phosphate (OPZELURA) 1.5 % CREA, Apply to affected area qd, Disp: 60 g, Rfl: 6   triamcinolone cream (KENALOG) 0.1 %, Apply 1 Application topically 2 (two) times daily., Disp: 30 g, Rfl: 0

## 2021-10-30 NOTE — Addendum Note (Signed)
Addended by: Valerie Salts on: 10/30/2021 12:13 PM   Modules accepted: Orders

## 2021-10-31 ENCOUNTER — Encounter: Payer: Self-pay | Admitting: Pulmonary Disease

## 2021-11-03 MED ORDER — ANORO ELLIPTA 62.5-25 MCG/ACT IN AEPB: 1.0000 | INHALATION_SPRAY | Freq: Every day | RESPIRATORY_TRACT | 5 refills | Status: DC

## 2021-11-03 MED ORDER — ALBUTEROL SULFATE HFA 108 (90 BASE) MCG/ACT IN AERS: 2.0000 | INHALATION_SPRAY | Freq: Four times a day (QID) | RESPIRATORY_TRACT | 6 refills | Status: DC | PRN

## 2021-11-03 NOTE — Telephone Encounter (Signed)
Assessment & Plan:    DOE (dyspnea on exertion)   History of pulmonary embolus (PE)   Centrilobular emphysema (HCC)   Discussion: Pleasant 46 year old female with centrilobular emphysema and symptoms consistent with the same.  Complicated by pulmonary embolism in the past, though I think there is little evidence right now of chronic pulmonary emboli.  Her smart watch has been giving her alerts for low O2 saturation which we will need to validate with testing today.   Plan: Centrilobular emphysema: Spirometry testing Ambulatory oximetry testing Start Anoro 1 puff daily no matter how you feel Albuterol 2 puffs every 4-6 hours as needed for chest tightness wheezing or shortness of breath Stay physically active Practice good hand hygiene Prevnar vaccine Don't smoke

## 2021-11-06 ENCOUNTER — Other Ambulatory Visit (HOSPITAL_COMMUNITY): Payer: Self-pay

## 2021-11-06 NOTE — Telephone Encounter (Signed)
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 No other alternatives would be cheaper as they are all Brand name. Patient may sign up to Patient Assistance and get medication shipped directly to them if found eligible.   Patient may go to the  [email or web address] web site to complete application.

## 2021-11-06 NOTE — Telephone Encounter (Signed)
Pharmacy, please advise if there are cheaper options to the Anoro. Thanks.

## 2021-11-17 ENCOUNTER — Other Ambulatory Visit: Payer: Self-pay | Admitting: Internal Medicine

## 2021-11-17 DIAGNOSIS — F418 Other specified anxiety disorders: Secondary | ICD-10-CM

## 2021-11-27 ENCOUNTER — Encounter: Payer: Self-pay | Admitting: Pulmonary Disease

## 2021-11-27 ENCOUNTER — Ambulatory Visit: Payer: Self-pay | Admitting: Pulmonary Disease

## 2021-11-27 NOTE — Progress Notes (Deleted)
Synopsis: Referred in October 2023 in the setting of provoked pulmonary embolism in 2021, cigarette smoking, centrilobular emphysema, and immunosuppression from Humira used for hidradenitis suppurativa.  Subjective:   PATIENT ID: Kimberly Rocha GENDER: female DOB: 1975-05-31, MRN: [id number]   HPI  No chief complaint on file.   ***Anoro? Smoking?  Past Medical History:  Diagnosis Date   Borderline high cholesterol    Borderline hypertension    Gallstones    Thyroid disease       ROS ***   Objective:  Physical Exam   There were no vitals filed for this visit.   RA  ***   CBC    Component Value Date/Time   WBC 8.0 07/09/2021 1057   RBC 3.49 (L) 07/09/2021 1057   HGB 12.3 07/09/2021 1057   HGB 12.3 08/29/2020 1000   HCT 35.3 07/09/2021 1057   HCT 36.1 08/29/2020 1000   PLT 135 (L) 07/09/2021 1057   PLT 209 08/29/2020 1000   MCV 101.1 (H) 07/09/2021 1057   MCV 98 (H) 08/29/2020 1000   MCH 35.2 (H) 07/09/2021 1057   MCHC 34.8 07/09/2021 1057   RDW 13.2 07/09/2021 1057   RDW 13.1 08/29/2020 1000   LYMPHSABS 2,528 07/09/2021 1057   LYMPHSABS 3.0 08/29/2020 1000   MONOABS 0.6 04/21/2020 1055   EOSABS 24 07/09/2021 1057   EOSABS 0.0 08/29/2020 1000   BASOSABS 40 07/09/2021 1057   BASOSABS 0.0 08/29/2020 1000     Chest imaging: March 2023 CT angiogram images independently reviewed showing no pulmonary embolism, mild upper lobe predominant centrilobular emphysema, normal cardiac silhouette. June 2023 chest x-ray images independently reviewed showing hyperinflation, normal cardiac silhouette, otherwise normal lung imaging.  PFT: October 2023 spirometry ratio 61%, FEV1 1.73 L 85% predicted  Labs:  Path:  Echo:  Heart Catheterization:       Assessment & Plan:   No diagnosis found.  Discussion: ***   Immunizations: Immunization History  Administered Date(s) Administered   Influenza,inj,Quad PF,6+ Mos 09/18/2021   Moderna Sars-Covid-2  Vaccination 02/27/2020, 03/27/2020   PNEUMOCOCCAL CONJUGATE-20 10/30/2021   Tdap 09/18/2021     Current Outpatient Medications:    Adalimumab (HUMIRA PEN) 40 MG/0.4ML PNKT, Inject 40 mg into the skin once a week. Starting on day 29., Disp: 2 each, Rfl: 6   Adalimumab (HUMIRA PEN-CD/UC/HS STARTER) 80 MG/0.8ML PNKT, Inject 160 mg into the skin as directed. Inject 160 mg subcutaneous on day 1, then 80 mg subcutaneous on day 15., Disp: 1 each, Rfl: 0   albuterol (VENTOLIN HFA) 108 (90 Base) MCG/ACT inhaler, Inhale 2 puffs into the lungs every 6 (six) hours as needed for wheezing or shortness of breath., Disp: 8 g, Rfl: 6   ALPRAZolam (XANAX) 0.5 MG tablet, Take 1 tablet (0.5 mg total) by mouth 2 (two) times daily as needed for anxiety., Disp: 30 tablet, Rfl: 2   augmented betamethasone dipropionate (DIPROLENE-AF) 0.05 % cream, Apply topically as needed., Disp: , Rfl:    clindamycin (CLEOCIN T) 1 % lotion, Apply topically every other day., Disp: , Rfl:    Clobetasol Prop Emollient Base (CLOBETASOL PROPIONATE E) 0.05 % emollient cream, Apply to affected area qd, Disp: 60 g, Rfl: 9   FLUoxetine (PROZAC) 20 MG capsule, Take 1 capsule (20 mg total) by mouth daily., Disp: 30 capsule, Rfl: 3   HYDROcodone-acetaminophen (NORCO/VICODIN) 5-325 MG tablet, One tablet every six hours for pain.  Limit 7 days., Disp: 28 tablet, Rfl: 0   hydrOXYzine (VISTARIL) 50 MG  capsule, TAKE 1 CAPSULE(50 MG) BY MOUTH THREE TIMES DAILY AS NEEDED, Disp: 90 capsule, Rfl: 0   levothyroxine (SYNTHROID) 75 MCG tablet, Take 1 tablet (75 mcg total) by mouth daily before breakfast., Disp: 90 tablet, Rfl: 1   progesterone (PROMETRIUM) 100 MG capsule, TAKE 1 CAPSULE(100 MG) BY MOUTH AT BEDTIME, Disp: 30 capsule, Rfl: 2   Ruxolitinib Phosphate (OPZELURA) 1.5 % CREA, Apply to affected area qd, Disp: 60 g, Rfl: 6   triamcinolone cream (KENALOG) 0.1 %, Apply 1 Application topically 2 (two) times daily., Disp: 30 g, Rfl: 0    umeclidinium-vilanterol (ANORO ELLIPTA) 62.5-25 MCG/ACT AEPB, Inhale 1 puff into the lungs daily., Disp: 30 each, Rfl: 5

## 2021-12-17 ENCOUNTER — Other Ambulatory Visit: Payer: Self-pay | Admitting: Internal Medicine

## 2021-12-17 DIAGNOSIS — F410 Panic disorder [episodic paroxysmal anxiety] without agoraphobia: Secondary | ICD-10-CM

## 2021-12-19 ENCOUNTER — Telehealth: Payer: Self-pay | Admitting: Pulmonary Disease

## 2021-12-19 NOTE — Telephone Encounter (Signed)
ATC LMTCB 11/242023 @11 :23 AM

## 2021-12-20 ENCOUNTER — Other Ambulatory Visit: Payer: Self-pay | Admitting: Internal Medicine

## 2021-12-20 DIAGNOSIS — F418 Other specified anxiety disorders: Secondary | ICD-10-CM

## 2021-12-22 MED ORDER — HYDROXYZINE PAMOATE 50 MG PO CAPS: 50.0000 mg | ORAL_CAPSULE | Freq: Three times a day (TID) | ORAL | 0 refills | Status: DC | PRN

## 2021-12-29 ENCOUNTER — Encounter: Payer: Self-pay | Admitting: Nurse Practitioner

## 2021-12-29 ENCOUNTER — Encounter: Payer: Self-pay | Admitting: Orthopaedic Surgery

## 2021-12-29 ENCOUNTER — Encounter: Payer: Self-pay | Admitting: Internal Medicine

## 2021-12-29 ENCOUNTER — Encounter: Payer: Self-pay | Admitting: Dermatology

## 2021-12-30 ENCOUNTER — Other Ambulatory Visit: Payer: Self-pay | Admitting: Internal Medicine

## 2021-12-30 DIAGNOSIS — F418 Other specified anxiety disorders: Secondary | ICD-10-CM

## 2022-01-01 ENCOUNTER — Other Ambulatory Visit: Payer: Self-pay | Admitting: *Deleted

## 2022-01-01 ENCOUNTER — Encounter: Payer: Self-pay | Admitting: Dermatology

## 2022-01-01 MED ORDER — VENTOLIN HFA 108 (90 BASE) MCG/ACT IN AERS: 2.0000 | INHALATION_SPRAY | Freq: Four times a day (QID) | RESPIRATORY_TRACT | 5 refills | Status: DC | PRN

## 2022-01-02 ENCOUNTER — Other Ambulatory Visit: Payer: Self-pay | Admitting: Internal Medicine

## 2022-01-02 ENCOUNTER — Telehealth: Payer: Self-pay | Admitting: *Deleted

## 2022-01-02 ENCOUNTER — Other Ambulatory Visit: Payer: Self-pay | Admitting: Adult Health

## 2022-01-02 DIAGNOSIS — E0590 Thyrotoxicosis, unspecified without thyrotoxic crisis or storm: Secondary | ICD-10-CM

## 2022-01-02 DIAGNOSIS — F418 Other specified anxiety disorders: Secondary | ICD-10-CM

## 2022-01-02 DIAGNOSIS — L219 Seborrheic dermatitis, unspecified: Secondary | ICD-10-CM

## 2022-01-02 DIAGNOSIS — L309 Dermatitis, unspecified: Secondary | ICD-10-CM

## 2022-01-02 NOTE — Patient Instructions (Signed)

## 2022-01-02 NOTE — Telephone Encounter (Signed)
Patient was in need of her lab orders being update for her appointment next week.

## 2022-01-03 LAB — T4, FREE: Free T4: 2.1 ng/dL — ABNORMAL HIGH (ref 0.82–1.77)

## 2022-01-03 LAB — TSH: TSH: 0.02 u[IU]/mL — ABNORMAL LOW (ref 0.450–4.500)

## 2022-01-05 ENCOUNTER — Telehealth: Payer: Self-pay | Admitting: Internal Medicine

## 2022-01-05 ENCOUNTER — Encounter: Payer: Self-pay | Admitting: Nurse Practitioner

## 2022-01-05 ENCOUNTER — Ambulatory Visit (INDEPENDENT_AMBULATORY_CARE_PROVIDER_SITE_OTHER): Payer: Medicaid Other | Admitting: Nurse Practitioner

## 2022-01-05 VITALS — Ht 60.0 in | Wt 111.4 lb

## 2022-01-05 DIAGNOSIS — E890 Postprocedural hypothyroidism: Secondary | ICD-10-CM

## 2022-01-05 MED ORDER — LEVOTHYROXINE SODIUM 137 MCG PO TABS: 62.5000 ug | ORAL_TABLET | Freq: Every day | ORAL | 1 refills | Status: DC

## 2022-01-05 NOTE — Progress Notes (Signed)
01/05/2022     Endocrinology Follow Up Note    TELEHEALTH VISIT: The patient is being engaged in telehealth visit.  This type of visit limits physical examination significantly, and thus is not preferable over face-to-face encounters.  I connected with  Kimberly Rocha on 01/05/22 by a video enabled telemedicine application and verified that I am speaking with the correct person using two identifiers.   I discussed the limitations of evaluation and management by telemedicine. The patient expressed understanding and agreed to proceed.    The participants involved in this visit include: Kimberly GobbleWhitney J Dailee Manalang, NP [facility] and Kimberly Rocha  located at their personal residence listed.   Subjective:    Patient ID: Kimberly Rocha, female    DOB: 1975-02-01, PCP Kimberly HalonPatel, Rutwik K, MD.   Past Medical History:  Diagnosis Date   Borderline high cholesterol    Borderline hypertension    Gallstones    Thyroid disease     Past Surgical History:  Procedure Laterality Date   CESAREAN SECTION     CHOLECYSTECTOMY N/A 08/22/2012   Procedure: LAPAROSCOPIC CHOLECYSTECTOMY;  Surgeon: Kimberly HeadingMark A Jenkins, MD;  Location: [facility];  Service: General;  Laterality: N/A;   GALLBLADDER SURGERY     HERNIA REPAIR     TUBAL LIGATION      Social History   Socioeconomic History   Marital status: Married    Spouse name: Not on file   Number of children: Not on file   Years of education: Not on file   Highest education level: Not on file  Occupational History   Not on file  Tobacco Use   Smoking status: Former    Packs/day: 0.50    Types: Cigarettes   Smokeless tobacco: Never  Vaping Use   Vaping Use: Former  Substance and Sexual Activity   Alcohol use: Yes    Comment: occ   Drug use: Yes    Frequency: 3.0 times per week    Types: Marijuana    Comment: twice a day   Sexual activity: Yes    Birth control/protection: Surgical    Comment: tubal  Other  Topics Concern   Not on file  Social History Narrative   Not on file   Social Determinants of Health   Financial Resource Strain: Medium Risk (09/09/2020)   Overall Financial Resource Strain (CARDIA)    Difficulty of Paying Living Expenses: Somewhat hard  Food Insecurity: Food Insecurity Present (09/09/2020)   Hunger Vital Sign    Worried About Running Out of Food in the Last Year: Sometimes true    Ran Out of Food in the Last Year: Sometimes true  Transportation Needs: No Transportation Needs (09/09/2020)   PRAPARE - Administrator, Civil ServiceTransportation    Lack of Transportation (Medical): No    Lack of Transportation (Non-Medical): No  Physical Activity: Insufficiently Active (09/09/2020)   Exercise Vital Sign    Days of Exercise per Week: 2 days    Minutes of Exercise per Session: 20 min  Stress: Stress Concern Present (09/09/2020)   [employer] - Occupational Stress Questionnaire    Feeling of Stress : Very much  Social Connections: Moderately Isolated (09/09/2020)   Social Connection and Isolation Panel [NHANES]    Frequency of Communication with Friends and Family: More than three times a week    Frequency of Social Gatherings with Friends and Family: Twice a week    Attends Religious Services: More than 4  times per year    Active Member of Clubs or Organizations: No    Attends Banker Meetings: Never    Marital Status: Never married    Family History  Problem Relation Age of Onset   COPD Mother    COPD Father    HIV/AIDS Brother    Stroke Maternal Grandmother    Other Daughter        has issues with cervix    Outpatient Encounter Medications as of 01/05/2022  Medication Sig   Adalimumab (HUMIRA PEN) 40 MG/0.4ML PNKT Inject 40 mg into the skin once a week. Starting on day 29.   Adalimumab (HUMIRA PEN-CD/UC/HS STARTER) 80 MG/0.8ML PNKT Inject 160 mg into the skin as directed. Inject 160 mg subcutaneous on day 1, then 80 mg subcutaneous on day 15.    ALPRAZolam (XANAX) 0.5 MG tablet TAKE 1 TABLET(0.5 MG) BY MOUTH TWICE DAILY AS NEEDED FOR ANXIETY   augmented betamethasone dipropionate (DIPROLENE-AF) 0.05 % cream Apply topically as needed.   clindamycin (CLEOCIN T) 1 % lotion Apply topically every other day.   Clobetasol Prop Emollient Base (CLOBETASOL PROPIONATE E) 0.05 % emollient cream Apply to affected area qd   FLUoxetine (PROZAC) 20 MG capsule Take 1 capsule (20 mg total) by mouth daily.   HYDROcodone-acetaminophen (NORCO/VICODIN) 5-325 MG tablet One tablet every six hours for pain.  Limit 7 days.   hydrOXYzine (VISTARIL) 50 MG capsule TAKE 1 CAPSULE(50 MG) BY MOUTH THREE TIMES DAILY AS NEEDED   progesterone (PROMETRIUM) 100 MG capsule TAKE 1 CAPSULE(100 MG) BY MOUTH AT BEDTIME   Ruxolitinib Phosphate (OPZELURA) 1.5 % CREA Apply to affected area qd   triamcinolone cream (KENALOG) 0.1 % Apply 1 Application topically 2 (two) times daily.   umeclidinium-vilanterol (ANORO ELLIPTA) 62.5-25 MCG/ACT AEPB Inhale 1 puff into the lungs daily.   valACYclovir (VALTREX) 1000 MG tablet TAKE 1 TABLET BY MOUTH TWICE DAILY   valACYclovir (VALTREX) 1000 MG tablet TAKE 1 TABLET(1000 MG) BY MOUTH TWICE DAILY   VENTOLIN HFA 108 (90 Base) MCG/ACT inhaler Inhale 2 puffs into the lungs every 6 (six) hours as needed for wheezing or shortness of breath.   [DISCONTINUED] levothyroxine (SYNTHROID) 75 MCG tablet Take 1 tablet (75 mcg total) by mouth daily before breakfast.   levothyroxine (SYNTHROID) 137 MCG tablet Take 0.5 tablets (68.5 mcg total) by mouth daily before breakfast.   No facility-administered encounter medications on file as of 01/05/2022.    ALLERGIES: Allergies  Allergen Reactions   Demerol Hives and Itching    VACCINATION STATUS: Immunization History  Administered Date(s) Administered   Influenza,inj,Quad PF,6+ Mos 09/18/2021   Moderna Sars-Covid-2 Vaccination 02/27/2020, 03/27/2020   PNEUMOCOCCAL CONJUGATE-20 10/30/2021   Tdap  09/18/2021     HPI  Kimberly Rocha is 46 y.o. female who presents today with a medical history as above. she is being seen in follow up after being seen in consultation for hyperthyroidism requested by Kimberly Halon, MD.    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  Treatment was successful and she has now been started on thyroid hormone replacement.  she denies dysphagia, choking, no recent voice change.    she denies family history of thyroid dysfunction and denies family hx of thyroid cancer. she denies personal history of goiter. she is not on any thyroid hormone supplements, has been taking antithyroid medication for approximately 30 days. Denies use of Biotin containing supplements.  she is willing to proceed with appropriate  work up and therapy for thyrotoxicosis.   Review of systems  Constitutional: + Minimally fluctuating body weight,  current Body mass index is 21.76 kg/m. , no fatigue, no subjective hyperthermia, no subjective hypothermia Eyes: no blurry vision, no xerophthalmia ENT: no sore throat, no nodules palpated in throat, no dysphagia/odynophagia, no hoarseness Cardiovascular: no chest pain, no shortness of breath, no palpitations, no leg swelling Respiratory: no cough, no shortness of breath Gastrointestinal: no nausea/vomiting/diarrhea Musculoskeletal: no muscle/joint aches Skin: no rashes, no hyperemia, + hair loss Neurological: no tremors, no numbness, + tingling/ itching sensation to skin,  no dizziness Psychiatric: no depression, no anxiety   Objective:    Ht 5' (1.524 m)   Wt 111 lb 6.4 oz (50.5 kg)   BMI 21.76 kg/m   Wt Readings from Last 3 Encounters:  01/05/22 111 lb 6.4 oz (50.5 kg)  10/30/21 113 lb 3.2 oz (51.3 kg)  09/18/21 109 lb 12.8 oz (49.8 kg)     BP Readings from Last 3 Encounters:  10/30/21 128/82  09/18/21 122/84  09/02/21 131/83                       Physical Exam- Telehealth- significantly limited  due to nature of visit  Constitutional: Body mass index is 21.76 kg/m. , not in acute distress, normal state of mind Respiratory: Adequate breathing efforts   CMP     Component Value Date/Time   NA 141 07/09/2021 1057   NA 143 07/25/2020 1029   Rocha 4.2 07/09/2021 1057   CL 109 07/09/2021 1057   CO2 21 07/09/2021 1057   GLUCOSE 83 07/09/2021 1057   BUN 11 07/09/2021 1057   BUN 7 07/25/2020 1029   CREATININE 1.04 (H) 07/09/2021 1057   CALCIUM 8.9 07/09/2021 1057   PROT 6.5 07/09/2021 1057   PROT 7.1 07/25/2020 1029   ALBUMIN 4.5 07/25/2020 1029   AST 16 07/09/2021 1057   ALT 18 07/09/2021 1057   ALKPHOS 79 07/25/2020 1029   BILITOT 0.4 07/09/2021 1057   BILITOT 0.3 07/25/2020 1029   GFRNONAA >60 04/21/2020 1055   GFRAA >60 10/17/2018 1545     CBC    Component Value Date/Time   WBC 8.0 07/09/2021 1057   RBC 3.49 (L) 07/09/2021 1057   HGB 12.3 07/09/2021 1057   HGB 12.3 08/29/2020 1000   HCT 35.3 07/09/2021 1057   HCT 36.1 08/29/2020 1000   PLT 135 (L) 07/09/2021 1057   PLT 209 08/29/2020 1000   MCV 101.1 (H) 07/09/2021 1057   MCV 98 (H) 08/29/2020 1000   MCH 35.2 (H) 07/09/2021 1057   MCHC 34.8 07/09/2021 1057   RDW 13.2 07/09/2021 1057   RDW 13.1 08/29/2020 1000   LYMPHSABS 2,528 07/09/2021 1057   LYMPHSABS 3.0 08/29/2020 1000   MONOABS 0.6 04/21/2020 1055   EOSABS 24 07/09/2021 1057   EOSABS 0.0 08/29/2020 1000   BASOSABS 40 07/09/2021 1057   BASOSABS 0.0 08/29/2020 1000     Diabetic Labs (most recent): No results found for: "HGBA1C", "MICROALBUR"  Lipid Panel     Component Value Date/Time   CHOL 233 (H) 07/25/2020 1029   TRIG 63 07/25/2020 1029   HDL 47 07/25/2020 1029   CHOLHDL 5.0 (H) 07/25/2020 1029   LDLCALC 175 (H) 07/25/2020 1029   LABVLDL 11 07/25/2020 1029     Lab Results  Component Value Date   TSH 0.020 (L) 01/02/2022   TSH 0.955 10/21/2021   TSH 2.460 08/25/2021  TSH 6.570 (H) 05/28/2021   TSH 12.100 (H) 03/26/2021   TSH  7.990 (H) 01/21/2021   TSH 1.290 11/27/2020   TSH 1.370 11/27/2020   TSH 0.320 (L) 08/29/2020   TSH 0.326 (L) 07/25/2020   FREET4 2.10 (H) 01/02/2022   FREET4 1.31 10/21/2021   FREET4 1.43 08/25/2021   FREET4 1.24 05/28/2021   FREET4 0.82 03/26/2021   FREET4 1.01 01/21/2021   FREET4 0.98 11/27/2020   FREET4 0.94 11/27/2020   FREET4 1.06 08/29/2020    Results for Bells, Yemaya A "TOYE" (MRN [id number]) as of 11/29/2020 09:49  Ref. Range 11/27/2020 13:14 11/27/2020 13:15  TSH Latest Ref Range: 0.450 - 4.500 uIU/mL 1.370 1.290  Triiodothyronine,Free,Serum Latest Ref Range: 2.0 - 4.4 pg/mL 2.8 2.9  T4,Free(Direct) Latest Ref Range: 0.82 - 1.77 ng/dL 4.09 8.11  Thyroperoxidase Ab SerPl-aCnc Latest Ref Range: 0 - 34 IU/mL  9  Thyroglobulin Antibody Latest Ref Range: 0.0 - 0.9 IU/mL  <1.0    Uptake and Scan from 10/29/20 CLINICAL DATA:  Hyperthyroidism   EXAM: THYROID SCAN AND UPTAKE - 4 AND 24 HOURS   TECHNIQUE: Following oral administration of I-123 capsule, anterior planar imaging was acquired at 24 hours. Thyroid uptake was calculated with a thyroid probe at 4-6 hours and 24 hours.   RADIOPHARMACEUTICALS:  431 uCi I-123 sodium iodide p.o.   COMPARISON:  Thyroid ultrasound 10/07/2020   FINDINGS: Homogeneous tracer distribution in both thyroid lobes.   No focal areas of increased or decreased tracer localization.   4 hour I-123 uptake = 21.7% (normal 5-20%)   24 hour I-123 uptake = 44.3% (normal 10-30%)   IMPRESSION: Normal thyroid scan with elevated 4 hour and 24 hour radio iodine uptakes.   Findings discussed with hyperthyroidism and Graves disease.     Electronically Signed   By: Ulyses Southward M.D.   On: 10/29/2020 17:17   Latest Reference Range & Units 08/25/21 11:11 10/21/21 10:29 01/02/22 11:32  TSH 0.450 - 4.500 uIU/mL 2.460 0.955 0.020 (L)  T4,Free(Direct) 0.82 - 1.77 ng/dL 9.14 7.82 9.56 (H)  (L): Data is abnormally low (H): Data is abnormally  high  Assessment & Plan:   1. Hypothyroidism s/p RAI ablation for Graves Disease  she is being seen at a kind request of Kimberly Halon, MD.  Her uptake and scan did show 4-hr uptake of 21.7% and 24-hr uptake of 44.3%, consistent with Graves disease and she had RAI ablation on 12/13/20.    Her previsit thyroid function tests are consistent with slight over-replacement.  However, 50 mcg was not enough.  I prescribed Levothyroxine 137 mcg tabs but she is only to take half daily before breakfast.  Will recheck labs in another 2 months to assess response.   - The correct intake of thyroid hormone (Levothyroxine, Synthroid), is on empty stomach first thing in the morning, with water, separated by at least 30 minutes from breakfast and other medications,  and separated by more than 4 hours from calcium, iron, multivitamins, acid reflux medications (PPIs).  - This medication is a life-long medication and will be needed to correct thyroid hormone imbalances for the rest of your life.  The dose may change from time to time, based on thyroid blood work.  - It is extremely important to be consistent taking this medication, near the same time each morning.  -AVOID TAKING PRODUCTS CONTAINING BIOTIN (commonly found in Hair, Skin, Nails vitamins) AS IT INTERFERES WITH THE VALIDITY OF THYROID FUNCTION BLOOD TESTS.    -  Patient is advised to maintain close follow up with Kimberly Halon, MD for primary care needs.     I spent 22 minutes dedicated to the care of this patient on the date of this encounter to include pre-visit review of records, face-to-face time with the patient, and post visit ordering of testing.  Kimberly Brunt  participated in the discussions, expressed understanding, and voiced agreement with the above plans.  All questions were answered to her satisfaction. she is encouraged to contact clinic should she have any questions or concerns prior to her return visit.  Follow up  plan: Return in about 2 months (around 03/08/2022) for Thyroid follow up, Virtual visit ok, Previsit labs.   Thank you for involving me in the care of this pleasant patient, and I will continue to update you with her progress.   Ronny Bacon, [facility] [address] [place], [place] [address] Phone: [phone] Fax: [phone]  01/05/2022, 1:10 PM

## 2022-01-05 NOTE — Telephone Encounter (Signed)
Todays appt can be virtual for her

## 2022-01-05 NOTE — Telephone Encounter (Signed)
Annabelle Harman with [employer] [phone] calling about medication Ruxolitinib Phosphate (OPZELURA) 1.5 % CREA  Just received from Dr Allena Katz our office.  Faxed a approval letter about 6 weeks ago nov 10th.  Trying to reach out to patient, not get in touch with patient by the Dec 18th, this will be closed return call to [facility] @ [phone].

## 2022-01-06 ENCOUNTER — Ambulatory Visit (INDEPENDENT_AMBULATORY_CARE_PROVIDER_SITE_OTHER): Payer: Medicaid Other | Admitting: Orthopaedic Surgery

## 2022-01-06 ENCOUNTER — Encounter: Payer: Self-pay | Admitting: Orthopaedic Surgery

## 2022-01-06 VITALS — BP 126/80 | HR 86 | Ht 61.0 in | Wt 111.0 lb

## 2022-01-06 DIAGNOSIS — M65312 Trigger thumb, left thumb: Secondary | ICD-10-CM

## 2022-01-06 MED ORDER — HYDROCODONE-ACETAMINOPHEN 5-325 MG PO TABS: ORAL_TABLET | ORAL | 0 refills | Status: DC

## 2022-01-06 MED ORDER — METHYLPREDNISOLONE ACETATE 40 MG/ML IJ SUSP
40.0000 mg | Freq: Once | INTRAMUSCULAR | Status: AC
  Administered 2022-01-06: 40 mg via INTRA_ARTICULAR

## 2022-01-06 NOTE — Addendum Note (Signed)
Addended by: Recardo Evangelist A on: 01/06/2022 03:41 PM   Modules accepted: Orders

## 2022-01-06 NOTE — Progress Notes (Signed)
My left thumb is locking.  She has trigger thumb on the left.  It locks often.  She has no trauma.  She will need surgery to release the A1 pulley.  She wants injection today because she may not be able to have surgery soon.  She locks the left thumb on the A1 pulley.  NV intact.  Encounter Diagnosis  Name Primary?   Trigger thumb, left thumb Yes   Procedure note: After permission from the patient and prep to the thumb area, 1 % xylocaine and 1 cc DepoMedrol 40 was instilled by injection into the tendon sheath area flexor side by sterile technique tolerated well.  To see Dr. Romeo Apple or Dr. Dallas Schimke for surgery and trigger thumb release.  Call if any problem.  Precautions discussed.  Electronically Signed Darreld Mclean, MD 12/12/20232:28 PM

## 2022-01-06 NOTE — Patient Instructions (Signed)
See Dr. Romeo Apple or Dr. Dallas Schimke to discuss trigger finger release  Dr.Keeling is here all day on Tuesdays, Wednesday mornings, and Thursday mornings. If you need anything such as a medication refill, please either call BEFORE the end of the day on [facility] or send a message through [place]. Your pharmacy can send a refill request for you. Calling by the end of the day on [facility] allows [place] time to send Dr.Keeling the request and for him to respond before he leaves on Thursdays.  If Dr. Hilda Lias is out of the office, we may send it to one of the other providers and they may not refill it for the same amount that your original prescription is for.

## 2022-01-06 NOTE — Telephone Encounter (Signed)
I've tried calling Kimberly Rocha , gentleman took name and phone number and said she would be in touch

## 2022-01-07 ENCOUNTER — Ambulatory Visit (INDEPENDENT_AMBULATORY_CARE_PROVIDER_SITE_OTHER): Payer: Medicaid Other | Admitting: Dermatology

## 2022-01-07 VITALS — BP 125/81 | HR 90

## 2022-01-07 DIAGNOSIS — L659 Nonscarring hair loss, unspecified: Secondary | ICD-10-CM | POA: Diagnosis not present

## 2022-01-07 DIAGNOSIS — L732 Hidradenitis suppurativa: Secondary | ICD-10-CM

## 2022-01-07 MED ORDER — DOXYCYCLINE MONOHYDRATE 100 MG PO CAPS: 100.0000 mg | ORAL_CAPSULE | Freq: Two times a day (BID) | ORAL | 3 refills | Status: DC

## 2022-01-07 MED ORDER — CLINDAMYCIN PHOSPHATE 1 % EX GEL: Freq: Every day | CUTANEOUS | 4 refills | Status: DC

## 2022-01-07 NOTE — Progress Notes (Signed)
New Patient Visit  Subjective  Kimberly Rocha is a 46 y.o. female who presents for the following: HS (Groin, axilla, inframammary Humira sq injections q wk, started in 06/2021, Clobetasol qhs, Clindamycin lotion qd, pt took Doxycycline for 5 days recently, pt thinks has improved on Humira) and Alopecia (Scalp, Clobetasol prn, Opzelura).  Scalp is better too.  New patient referral from Dr. Trena Platt.  The following portions of the chart were reviewed this encounter and updated as appropriate:       Review of Systems:  No other skin or systemic complaints except as noted in HPI or Assessment and Plan.  Objective  Well appearing patient in no apparent distress; mood and affect are within normal limits.  A focused examination was performed including scalp, groin. Relevant physical exam findings are noted in the Assessment and Plan.  groin Sq nodule 0.8cm R labia majora, hyperpigmented patches labia and superpubic area, closed comedones and cysts with scarring inframammary/upper abdomen  Scalp Good hair regrowth posterior crown and occipital scalp, no obvious patches of alopecia today    Assessment & Plan  Hidradenitis suppurativa groin  With Irritant vs Atopic Dermatitis vulva/super pubic area (has itching in area). Chronic and persistent condition with duration or expected duration over one year. Condition is symptomatic/ bothersome to patient. Not currently at goal.   Hidradenitis Suppurativa is a chronic; persistent; non-curable, but treatable condition due to abnormal inflamed sweat glands in the body folds (axilla, inframammary, groin, medial thighs), causing recurrent painful draining cysts and scarring. It can be associated with severe scarring acne and cysts; also abscesses and scarring of scalp. The goal is control and prevention of flares, as it is not curable. Scars are permanent and can be thickened. Treatment may include daily use of topical medication and oral  antibiotics.  Oral isotretinoin may also be helpful.  For more severe cases, Humira or Cosentyx (biologic injections) may be prescribed to decrease the inflammatory process and prevent flares.  When indicated, inflamed cysts may also be treated surgically.  D/c  clobetasol cr to groin Start Doxycycline 100mg  1 po bid prn flares for 2 weeks, take with food and drink Change Clindamycin to Clindamycin gel qd to inframammary and groin Start Opzelura cr qd/bid aa groin for itching Cont Humira sq injections q wk (on pt assistance), will send in new RX for 80  mg qowk after what she has runs out. Doxycycline should be taken with food to prevent nausea. Do not lay down for 30 minutes after taking. Be cautious with sun exposure and use good sun protection while on this medication. Pregnant women should not take this medication.     doxycycline (MONODOX) 100 MG capsule - groin Take 1 capsule (100 mg total) by mouth 2 (two) times daily. Take with food and drink  clindamycin (CLINDAGEL) 1 % gel - groin Apply topically daily. Qd to aa rash groin, under breast  Related Medications Adalimumab (HUMIRA PEN-CD/UC/HS STARTER) 80 MG/0.8ML PNKT Inject 160 mg into the skin as directed. Inject 160 mg subcutaneous on day 1, then 80 mg subcutaneous on day 15.  Adalimumab (HUMIRA PEN) 40 MG/0.4ML PNKT Inject 40 mg into the skin once a week. Starting on day 29.  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 Condition is symptomatic/ bothersome to patient. Improving but not currently at goal.    Avoid picking/tweezing hairs in scalp Cont Clobetasol cr to aa scalp qd  prn itching, avoid face, groin, axilla Continue Opzulura cream qd to aa scalp   Alopecia areata is a chronic autoimmune condition localized to the skin which affects hair follicles and causes hair loss, most commonly in the scalp.  Cause is unknown.  Can be  unpredictable, difficult to treat, and may recur.  Treatments may include topical and intralesional steroids to decrease inflammation to allow for hair regrowth.  Other treatments may include narrowband ultraviolet B light treatment; topical Squairic acid immunotherapy application; topical or oral Minoxidil; antihistamines and oral Jak inhibitors.   Related Medications Ruxolitinib Phosphate (OPZELURA) 1.5 % CREA Apply to affected area qd  Clobetasol Prop Emollient Base (CLOBETASOL PROPIONATE E) 0.05 % emollient cream Apply to affected area qd   Return in about 3 months (around 04/08/2022) for HS, Alopecia.   I, Othelia Pulling, RMA, am acting as scribe for Brendolyn Patty, MD .  Documentation: I have reviewed the above documentation for accuracy and completeness, and I agree with the above.  Brendolyn Patty MD

## 2022-01-07 NOTE — Patient Instructions (Addendum)
Stop using clobetasol cream to groin Start Doxycycline 100mg  1 pill 2 times a day as needed for flares for 2 weeks, take with food and drink Change Clindamycin to Clindamycin gel and use once daily to under breast Start Opzeluar cr 1 to 2 times a day to affected area groin for the itching Continue the Humira injections weekly  Continue Clobetasol cream to itchy areas in scalp once daily for itching    Due to recent changes in healthcare laws, you may see results of your pathology and/or laboratory studies on MyChart before the doctors have had a chance to review them. We understand that in some cases there may be results that are confusing or concerning to you. Please understand that not all results are received at the same time and often the doctors may need to interpret multiple results in order to provide you with the best plan of care or course of treatment. Therefore, we ask that you please give 2 business days to thoroughly review all your results before contacting the office for clarification. Should we see a critical lab result, you will be contacted sooner.   If You Need Anything After Your Visit  If you have any questions or concerns for your doctor, please call our main line at [phone] and press option 4 to reach your doctor's medical assistant. If no one answers, please leave a voicemail as directed and we will return your call as soon as possible. Messages left after 4 pm will be answered the following business day.   You may also send [phone] a message via MyChart. We typically respond to MyChart messages within 1-2 business days.  For prescription refills, please ask your pharmacy to contact our office. Our fax number is [phone].  If you have an urgent issue when the clinic is closed that cannot wait until the next business day, you can page your doctor at the number below.    Please note that while we do our best to be available for urgent issues outside of office  hours, we are not available 24/7.   If you have an urgent issue and are unable to reach [phone], you may choose to seek medical care at your doctor's office, retail clinic, urgent care center, or emergency room.  If you have a medical emergency, please immediately call 911 or go to the emergency department.  Pager Numbers  - Dr. [place]: [phone]  - Dr. [phone]: [phone]  - Dr. [phone]: [phone]  In the event of inclement weather, please call our main line at [phone] for an update on the status of any delays or closures.  Dermatology Medication Tips: Please keep the boxes that topical medications come in in order to help keep track of the instructions about where and how to use these. Pharmacies typically print the medication instructions only on the boxes and not directly on the medication tubes.   If your medication is too expensive, please contact our office at [phone] option 4 or send [phone] a message through MyChart.   We are unable to tell what your co-pay for medications will be in advance as this is different depending on your insurance coverage. However, we may be able to find a substitute medication at lower cost or fill out paperwork to get insurance to cover a needed medication.   If a prior authorization is required to get your medication covered by your insurance company, please allow [place] 1-2 business days to complete this process.  Drug prices often  vary depending on where the prescription is filled and some pharmacies may offer cheaper prices.  The website www.goodrx.com contains coupons for medications through different pharmacies. The prices here do not account for what the cost may be with help from insurance (it may be cheaper with your insurance), but the website can give you the price if you did not use any insurance.  - You can print the associated coupon and take it with your prescription to the pharmacy.  - You may also stop by our office during regular  business hours and pick up a GoodRx coupon card.  - If you need your prescription sent electronically to a different pharmacy, notify our office through [facility] or by phone at [phone] option 4.     Si Usted Necesita Algo Despus de Su Visita  Tambin puede enviarnos un mensaje a travs de Clinical cytogeneticist. Por lo general respondemos a los mensajes de MyChart en el transcurso de 1 a 2 das hbiles.  Para renovar recetas, por favor pida a su farmacia que se ponga en contacto con nuestra oficina. Annie Sable de fax es [place] [phone].  Si tiene un asunto urgente cuando la clnica est cerrada y que no puede esperar hasta el siguiente da hbil, puede llamar/localizar a su doctor(a) al nmero que aparece a continuacin.   Por favor, tenga en cuenta que aunque hacemos todo lo posible para estar disponibles para asuntos urgentes fuera del horario de [place], no estamos disponibles las 24 horas del da, los 7 [address]  [address] de la [place].   Si tiene un problema urgente y no puede comunicarse con nosotros, puede optar por buscar atencin mdica  en el consultorio de su doctor(a), en una clnica privada, en un centro de atencin urgente o en una sala de emergencias.  Si tiene Engineer, drilling, por favor llame inmediatamente al 911 o vaya a la sala de emergencias.  Nmeros de bper  - Dr. Gwen Pounds: [phone]  - Dra. Moye: [phone]  - Dra. Roseanne Reno: [phone]  En caso de inclemencias del [place], por favor llame a Lacy Duverney principal al [phone] para una actualizacin sobre el [place] de cualquier retraso o cierre.  Consejos para la medicacin en dermatologa: Por favor, guarde las cajas en las que vienen los medicamentos de uso tpico para ayudarle a seguir las instrucciones sobre dnde y cmo usarlos. Las farmacias generalmente imprimen las instrucciones del medicamento slo en las cajas y no directamente en los tubos del [place].   Si su medicamento es muy caro, por  favor, pngase en contacto con Rolm Gala llamando al [phone] y presione la opcin 4 o envenos un mensaje a travs de Clinical cytogeneticist.   No podemos decirle cul ser su copago por los medicamentos por adelantado ya que esto es diferente dependiendo de la cobertura de su seguro. Sin embargo, es posible que podamos encontrar un medicamento sustituto a Audiological scientist un formulario para que el seguro cubra el medicamento que se considera necesario.   Si se requiere una autorizacin previa para que su compaa de seguros [place] su medicamento, por favor permtanos de 1 a 2 das hbiles para completar [address]  Los precios de los medicamentos varan con frecuencia dependiendo del Environmental consultant de dnde se surte la receta y alguna farmacias pueden ofrecer precios ms baratos.  El sitio web www.goodrx.com tiene cupones para medicamentos de Health and safety inspector. Los precios aqu no tienen en cuenta lo que podra costar con la ayuda del seguro (puede ser ms barato con su  pero el sitio web puede darle el precio si no utiliz ningn seguro.  - Puede imprimir el cupn correspondiente y llevarlo con su receta a la farmacia.  - Tambin puede pasar por nuestra oficina durante el horario de atencin regular y recoger una tarjeta de cupones de GoodRx.  - Si necesita que su receta se enve electrnicamente a una farmacia diferente, informe a nuestra oficina a travs de MyChart de [place] o por telfono llamando al [phone] y presione la opcin 4.  

## 2022-01-08 ENCOUNTER — Other Ambulatory Visit: Payer: Self-pay

## 2022-01-08 DIAGNOSIS — F410 Panic disorder [episodic paroxysmal anxiety] without agoraphobia: Secondary | ICD-10-CM

## 2022-01-08 DIAGNOSIS — F418 Other specified anxiety disorders: Secondary | ICD-10-CM

## 2022-01-08 MED ORDER — FLUOXETINE HCL 20 MG PO CAPS: 20.0000 mg | ORAL_CAPSULE | Freq: Every day | ORAL | 3 refills | Status: DC

## 2022-01-14 ENCOUNTER — Encounter: Payer: Self-pay | Admitting: Internal Medicine

## 2022-01-14 ENCOUNTER — Ambulatory Visit (INDEPENDENT_AMBULATORY_CARE_PROVIDER_SITE_OTHER): Payer: Medicaid Other | Admitting: Internal Medicine

## 2022-01-14 VITALS — BP 138/84 | HR 98 | Ht 61.0 in | Wt 119.6 lb

## 2022-01-14 DIAGNOSIS — Z0001 Encounter for general adult medical examination with abnormal findings: Secondary | ICD-10-CM

## 2022-01-14 DIAGNOSIS — F410 Panic disorder [episodic paroxysmal anxiety] without agoraphobia: Secondary | ICD-10-CM

## 2022-01-14 DIAGNOSIS — E890 Postprocedural hypothyroidism: Secondary | ICD-10-CM | POA: Diagnosis not present

## 2022-01-14 DIAGNOSIS — E782 Mixed hyperlipidemia: Secondary | ICD-10-CM

## 2022-01-14 DIAGNOSIS — R739 Hyperglycemia, unspecified: Secondary | ICD-10-CM

## 2022-01-14 DIAGNOSIS — L732 Hidradenitis suppurativa: Secondary | ICD-10-CM | POA: Diagnosis not present

## 2022-01-14 DIAGNOSIS — Z1211 Encounter for screening for malignant neoplasm of colon: Secondary | ICD-10-CM

## 2022-01-14 DIAGNOSIS — F418 Other specified anxiety disorders: Secondary | ICD-10-CM

## 2022-01-14 DIAGNOSIS — E559 Vitamin D deficiency, unspecified: Secondary | ICD-10-CM

## 2022-01-14 MED ORDER — FLUOXETINE HCL 20 MG PO CAPS: 20.0000 mg | ORAL_CAPSULE | Freq: Every day | ORAL | 3 refills | Status: DC

## 2022-01-14 MED ORDER — HYDROXYZINE PAMOATE 50 MG PO CAPS: 50.0000 mg | ORAL_CAPSULE | Freq: Three times a day (TID) | ORAL | 3 refills | Status: DC | PRN

## 2022-01-14 NOTE — Progress Notes (Signed)
Established Patient Office Visit  Subjective:  Patient ID: Kimberly Rocha, female    DOB: [birth year]  Age: 46 y.o. MRN: [id number]  CC:  Chief Complaint  Patient presents with   Annual Exam    HPI Kimberly Rocha is a 46 y.o. female with past medical history of PE (11/2019), hypothyroidism, seizure disorder, and anxiety with depression who presents for annual physical.  She has been taking Prozac and Vistaril for anxiety.  She was placed on Xanax as needed for panic episodes as well, but prefers to avoid it for now. She was on Lexapro and Remeron in the past, but had severe night sweats with them. She has h/o depression and suicidal ideations in the past. She denies any acute stressors. She has had panic episodes, where she feels short of breath and has severe flushing with palpitations.  Postablative hypothyroidism: She has been taking levothyroxine 68.5 mcg daily.  Followed by endocrinology.  Hydradenitis suppurativa and alopecia: She is on Humira for hidradenitis suppurativa, follows up with dermatology.  She uses Opzelura cream for alopecia.    Past Medical History:  Diagnosis Date   Borderline high cholesterol    Borderline hypertension    Gallstones    Hyperthyroidism 09/17/2020   Thyroid disease     Past Surgical History:  Procedure Laterality Date   CESAREAN SECTION     CHOLECYSTECTOMY N/A 08/22/2012   Procedure: LAPAROSCOPIC CHOLECYSTECTOMY;  Surgeon: Jamesetta So, MD;  Location: [facility];  Service: General;  Laterality: N/A;   GALLBLADDER SURGERY     HERNIA REPAIR     TUBAL LIGATION      Family History  Problem Relation Age of Onset   COPD Mother    COPD Father    HIV/AIDS Brother    Stroke Maternal Grandmother    Other Daughter        has issues with cervix    Social History   Socioeconomic History   Marital status: Married    Spouse name: Not on file   Number of children: Not on file   Years of education: Not on file   Highest education level:  Not on file  Occupational History   Not on file  Tobacco Use   Smoking status: Former    Packs/day: 0.50    Types: Cigarettes   Smokeless tobacco: Never  Vaping Use   Vaping Use: Former  Substance and Sexual Activity   Alcohol use: Yes    Comment: occ   Drug use: Yes    Frequency: 3.0 times per week    Types: Marijuana    Comment: twice a day   Sexual activity: Yes    Birth control/protection: Surgical    Comment: tubal  Other Topics Concern   Not on file  Social History Narrative   Not on file   Social Determinants of Health   Financial Resource Strain: Medium Risk (09/09/2020)   Overall Financial Resource Strain (CARDIA)    Difficulty of Paying Living Expenses: Somewhat hard  Food Insecurity: Food Insecurity Present (09/09/2020)   Hunger Vital Sign    Worried About Running Out of Food in the Last Year: Sometimes true    Ran Out of Food in the Last Year: Sometimes true  Transportation Needs: No Transportation Needs (09/09/2020)   PRAPARE - Hydrologist (Medical): No    Lack of Transportation (Non-Medical): No  Physical Activity: Insufficiently Active (09/09/2020)   Exercise Vital Sign    Days of  Exercise per Week: 2 days    Minutes of Exercise per Session: 20 min  Stress: Stress Concern Present (09/09/2020)   [place]    Feeling of Stress : Very much  Social Connections: Moderately Isolated (09/09/2020)   Social Connection and Isolation Panel [NHANES]    Frequency of Communication with Friends and Family: More than three times a week    Frequency of Social Gatherings with Friends and Family: Twice a week    Attends Religious Services: More than 4 times per year    Active Member of [employer] or Organizations: No    Attends Archivist Meetings: Never    Marital Status: Never married  Intimate Partner Violence: Not At Risk (09/09/2020)   Humiliation, Afraid, Rape, and  Kick questionnaire    Fear of Current or Ex-Partner: No    Emotionally Abused: No    Physically Abused: No    Sexually Abused: No    Outpatient Medications Prior to Visit  Medication Sig Dispense Refill   Adalimumab (HUMIRA PEN) 40 MG/0.4ML PNKT Inject 40 mg into the skin once a week. Starting on day 29. 2 each 6   Adalimumab (HUMIRA PEN-CD/UC/HS STARTER) 80 MG/0.8ML PNKT Inject 160 mg into the skin as directed. Inject 160 mg subcutaneous on day 1, then 80 mg subcutaneous on day 15. 1 each 0   augmented betamethasone dipropionate (DIPROLENE-AF) 0.05 % cream Apply topically as needed.     clindamycin (CLINDAGEL) 1 % gel Apply topically daily. Qd to aa rash groin, under breast 60 g 4   Clobetasol Prop Emollient Base (CLOBETASOL PROPIONATE E) 0.05 % emollient cream Apply to affected area qd 60 g 9   doxycycline (MONODOX) 100 MG capsule Take 1 capsule (100 mg total) by mouth 2 (two) times daily. Take with food and drink 60 capsule 3   HYDROcodone-acetaminophen (NORCO/VICODIN) 5-325 MG tablet One tablet every six hours for pain.  Limit 7 days. 28 tablet 0   levothyroxine (SYNTHROID) 137 MCG tablet Take 0.5 tablets (68.5 mcg total) by mouth daily before breakfast. 45 tablet 1   progesterone (PROMETRIUM) 100 MG capsule TAKE 1 CAPSULE(100 MG) BY MOUTH AT BEDTIME 30 capsule 2   Ruxolitinib Phosphate (OPZELURA) 1.5 % CREA Apply to affected area qd 60 g 6   umeclidinium-vilanterol (ANORO ELLIPTA) 62.5-25 MCG/ACT AEPB Inhale 1 puff into the lungs daily. 30 each 5   VENTOLIN HFA 108 (90 Base) MCG/ACT inhaler Inhale 2 puffs into the lungs every 6 (six) hours as needed for wheezing or shortness of breath. 6.7 g 5   ALPRAZolam (XANAX) 0.5 MG tablet TAKE 1 TABLET(0.5 MG) BY MOUTH TWICE DAILY AS NEEDED FOR ANXIETY 30 tablet 0   FLUoxetine (PROZAC) 20 MG capsule Take 1 capsule (20 mg total) by mouth daily. 30 capsule 3   hydrOXYzine (VISTARIL) 50 MG capsule TAKE 1 CAPSULE(50 MG) BY MOUTH THREE TIMES DAILY AS  NEEDED 90 capsule 0   triamcinolone cream (KENALOG) 0.1 % Apply 1 Application topically 2 (two) times daily. (Patient not taking: Reported on 01/07/2022) 30 g 0   No facility-administered medications prior to visit.    Allergies  Allergen Reactions   Demerol Hives and Itching    ROS Review of Systems  Constitutional:  Positive for fatigue. Negative for chills and fever.  HENT:  Negative for congestion, sinus pressure, sinus pain and sore throat.   Eyes:  Negative for pain and discharge.  Respiratory:  Negative for cough and  shortness of breath.   Cardiovascular:  Negative for chest pain and palpitations.  Gastrointestinal:  Negative for abdominal pain, diarrhea, nausea and vomiting.  Endocrine: Negative for polydipsia and polyuria.  Genitourinary:  Negative for dysuria and hematuria.  Musculoskeletal:  Positive for arthralgias, back pain and neck pain. Negative for neck stiffness.  Skin:  Positive for rash.  Neurological:  Positive for dizziness, weakness and numbness.  Psychiatric/Behavioral:  Positive for agitation, dysphoric mood and sleep disturbance. Negative for behavioral problems. The patient is nervous/anxious.       Objective:    Physical Exam Vitals reviewed.  Constitutional:      General: She is not in acute distress.    Appearance: She is not diaphoretic.  HENT:     Head: Normocephalic and atraumatic.     Nose: Nose normal.     Mouth/Throat:     Mouth: Mucous membranes are moist.  Eyes:     General: No scleral icterus.    Extraocular Movements: Extraocular movements intact.  Cardiovascular:     Rate and Rhythm: Normal rate and regular rhythm.     Pulses: Normal pulses.     Heart sounds: Normal heart sounds. No murmur heard. Pulmonary:     Breath sounds: Normal breath sounds. No wheezing or rales.  Abdominal:     Palpations: Abdomen is soft.     Tenderness: There is no abdominal tenderness.  Musculoskeletal:     Cervical back: Neck supple. No  tenderness.     Right lower leg: No edema.     Left lower leg: No edema.  Skin:    General: Skin is warm.  Neurological:     General: No focal deficit present.     Mental Status: She is alert and oriented to person, place, and time.     Sensory: Sensory deficit (Bilateral hands) present.     Motor: Weakness (4/5 in left UE) present.  Psychiatric:        Mood and Affect: Mood normal.        Behavior: Behavior normal.     BP 138/84 (BP Location: Left Arm, Cuff Size: Normal)   Pulse 98   Ht _0  (1.549 m)   Wt 119 lb 9.6 oz (54.3 kg)   SpO2 98%   BMI 22.60 kg/m  Wt Readings from Last 3 Encounters:  01/14/22 119 lb 9.6 oz (54.3 kg)  01/06/22 111 lb (50.3 kg)  01/05/22 111 lb 6.4 oz (50.5 kg)    Lab Results  Component Value Date   TSH 0.020 (L) 01/02/2022   Lab Results  Component Value Date   WBC 8.0 07/09/2021   HGB 12.3 07/09/2021   HCT 35.3 07/09/2021   MCV 101.1 (H) 07/09/2021   PLT 135 (L) 07/09/2021   Lab Results  Component Value Date   NA 141 07/09/2021   K 4.2 07/09/2021   CO2 21 07/09/2021   GLUCOSE 83 07/09/2021   BUN 11 07/09/2021   CREATININE 1.04 (H) 07/09/2021   BILITOT 0.4 07/09/2021   ALKPHOS 79 07/25/2020   AST 16 07/09/2021   ALT 18 07/09/2021   PROT 6.5 07/09/2021   ALBUMIN 4.5 07/25/2020   CALCIUM 8.9 07/09/2021   ANIONGAP 8 04/21/2020   EGFR 89 07/25/2020   Lab Results  Component Value Date   CHOL 233 (H) 07/25/2020   Lab Results  Component Value Date   HDL 47 07/25/2020   Lab Results  Component Value Date   LDLCALC 175 (H) 07/25/2020  Lab Results  Component Value Date   TRIG 63 07/25/2020   Lab Results  Component Value Date   CHOLHDL 5.0 (H) 07/25/2020   No results found for: "HGBA1C"    Assessment & Plan:   Problem List Items Addressed This Visit       Endocrine   Postablative hypothyroidism    Lab Results  Component Value Date   TSH 0.020 (L) 01/02/2022  On levothyroxine 68.5 mg QD Followed by  Endocrinology - s/p RA ablation for Graves' disease      Relevant Orders   CMP14+EGFR   CBC with Differential/Platelet     Musculoskeletal and Integument   Hidradenitis suppurativa    On Humira Has clindamycin lotion Follows up with Dermatology        Other   Anxiety with depression    Uncontrolled with Prozac 20 mg QD and Vistaril 50 mg TID PRN Referred to psychiatry/BH therapy, but could not follow up Had given Xanax as needed for panic disorder, but prefers to avoid it Current uncontrolled hypothyroidism can also lead to agitation and fatigue, followed by endocrinology      Relevant Medications   FLUoxetine (PROZAC) 20 MG capsule   hydrOXYzine (VISTARIL) 50 MG capsule   Other Relevant Orders   CMP14+EGFR   CBC with Differential/Platelet   Encounter for general adult medical examination with abnormal findings - Primary    Physical exam as documented. Fasting blood tests ordered.      Relevant Orders   Lipid panel   Hemoglobin A1c   CMP14+EGFR   CBC with Differential/Platelet   Mixed hyperlipidemia    Used to take statin Check lipid profile      Relevant Orders   Lipid panel   Panic disorder    Had given Xanax as needed for panic episodes, but DC as she wants to avoid it for now On Prozac and PRN Hydroxyzine      Relevant Medications   FLUoxetine (PROZAC) 20 MG capsule   hydrOXYzine (VISTARIL) 50 MG capsule   Other Relevant Orders   CMP14+EGFR   CBC with Differential/Platelet   Other Visit Diagnoses     Colon cancer screening       Relevant Orders   Ambulatory referral to Gastroenterology   Hyperglycemia       Relevant Orders   Hemoglobin A1c   Vitamin D deficiency       Relevant Orders   VITAMIN D 25 Hydroxy (Vit-D Deficiency, Fractures)       Meds ordered this encounter  Medications   FLUoxetine (PROZAC) 20 MG capsule    Sig: Take 1 capsule (20 mg total) by mouth daily.    Dispense:  30 capsule    Refill:  3   hydrOXYzine (VISTARIL) 50  MG capsule    Sig: Take 1 capsule (50 mg total) by mouth 3 (three) times daily as needed.    Dispense:  90 capsule    Refill:  3    Follow-up: Return in about 4 months (around 05/16/2022) for GAD.    Lindell Spar, MD

## 2022-01-14 NOTE — Assessment & Plan Note (Signed)
Physical exam as documented. Fasting blood tests ordered. 

## 2022-01-14 NOTE — Assessment & Plan Note (Signed)
Had given Xanax as needed for panic episodes, but DC as she wants to avoid it for now On Prozac and PRN Hydroxyzine

## 2022-01-14 NOTE — Assessment & Plan Note (Signed)
On Humira Has clindamycin lotion Follows up with Dermatology

## 2022-01-14 NOTE — Assessment & Plan Note (Addendum)
Used to take statin Check lipid profile

## 2022-01-14 NOTE — Patient Instructions (Signed)
Please continue to take medications as prescribed.  Please continue to follow low salt diet and perform moderate exercise/walking at least 150 mins/week.  

## 2022-01-14 NOTE — Assessment & Plan Note (Signed)
Uncontrolled with Prozac 20 mg QD and Vistaril 50 mg TID PRN Referred to psychiatry/BH therapy, but could not follow up Had given Xanax as needed for panic disorder, but prefers to avoid it Current uncontrolled hypothyroidism can also lead to agitation and fatigue, followed by endocrinology

## 2022-01-14 NOTE — Assessment & Plan Note (Signed)
Lab Results  Component Value Date   TSH 0.020 (L) 01/02/2022   On levothyroxine 68.5 mg QD Followed by Endocrinology - s/p RA ablation for Graves' disease

## 2022-01-15 ENCOUNTER — Encounter: Payer: Self-pay | Admitting: *Deleted

## 2022-01-29 ENCOUNTER — Encounter: Payer: Self-pay | Admitting: Pulmonary Disease

## 2022-01-29 ENCOUNTER — Ambulatory Visit (INDEPENDENT_AMBULATORY_CARE_PROVIDER_SITE_OTHER): Payer: Medicaid Other | Admitting: Pulmonary Disease

## 2022-01-29 VITALS — BP 118/68 | HR 89 | Temp 98.6°F | Ht 60.0 in | Wt 114.8 lb

## 2022-01-29 DIAGNOSIS — J432 Centrilobular emphysema: Secondary | ICD-10-CM

## 2022-01-29 NOTE — Progress Notes (Signed)
Synopsis: Referred in October 2023 for dyspnea on exertion.  She has a past medical history significant for segmental pulmonary embolism with a normal echocardiogram in November 2021 after a plane trip.  Also has a history of smoking cigarettes.  Subjective:   PATIENT ID: Kimberly Rocha GENDER: female DOB: 1975/12/13, MRN: [id number]   HPI  Chief Complaint  Patient presents with   Follow-up    Pt states she is not able to pay for Anoro right now but she is feeling better.      Toye says she has been doing OK lately.  She has been exercising more lately.  She says that she has a lot of birds in her house and she dances with her birds for exercise.  She says that she is taking 30-60 minutes at a time at least 3 times per week.  She initially started to feel some shortness of breath, but now that is getting easier.    No flare ups of COPD since the last visit.  She's had mucus production a couple of times.    She's not taking Anoro mostly due to cost.   She says that initially she needed a lot of albuterol use.  However lately she has not needed it much.  Not smoking cigarettes.  Some cannabis use is ongoing.  Past Medical History:  Diagnosis Date   Borderline high cholesterol    Borderline hypertension    Gallstones    Hyperthyroidism 09/17/2020   Thyroid disease        Review of Systems  Constitutional:  Negative for chills, fever, malaise/fatigue and weight loss.  HENT:  Negative for congestion, sinus pain and sore throat.   Respiratory:  Negative for cough, sputum production and shortness of breath.   Cardiovascular:  Negative for chest pain and leg swelling.      Objective:  Physical Exam   Vitals:   01/29/22 1416  BP: 118/68  Pulse: 89  Temp: 98.6 F (37 C)  TempSrc: Oral  SpO2: 100%  Weight: 114 lb 12.8 oz (52.1 kg)  Height: 5' (1.524 m)     RA  Gen: well appearing HENT: OP clear, neck supple PULM: CTA B, normal effort  CV: RRR, no mgr GI:  BS+, soft, nontender Derm: no cyanosis or rash Psyche: normal mood and affect   CBC    Component Value Date/Time   WBC 8.0 07/09/2021 1057   RBC 3.49 (L) 07/09/2021 1057   HGB 12.3 07/09/2021 1057   HGB 12.3 08/29/2020 1000   HCT 35.3 07/09/2021 1057   HCT 36.1 08/29/2020 1000   PLT 135 (L) 07/09/2021 1057   PLT 209 08/29/2020 1000   MCV 101.1 (H) 07/09/2021 1057   MCV 98 (H) 08/29/2020 1000   MCH 35.2 (H) 07/09/2021 1057   MCHC 34.8 07/09/2021 1057   RDW 13.2 07/09/2021 1057   RDW 13.1 08/29/2020 1000   LYMPHSABS 2,528 07/09/2021 1057   LYMPHSABS 3.0 08/29/2020 1000   MONOABS 0.6 04/21/2020 1055   EOSABS 24 07/09/2021 1057   EOSABS 0.0 08/29/2020 1000   BASOSABS 40 07/09/2021 1057   BASOSABS 0.0 08/29/2020 1000     Chest imaging: March 2023 CT angiogram images independently reviewed showing no pulmonary embolism, mild upper lobe predominant centrilobular emphysema, normal cardiac silhouette. June 2023 chest x-ray images independently reviewed showing hyperinflation, normal cardiac silhouette, otherwise normal lung imaging.  PFT: October 2023 spirometry ratio 61%, FEV1 1.73 L 85% predicted  Labs:  Path:  Echo:  Heart Catheterization:       Assessment & Plan:   Centrilobular emphysema ([place])  Discussion:  This has been a stable interval for [name].  Shortness of breath has improved with exercise.  No exacerbations of COPD.  Centrilobular emphysema with mild airflow obstruction: Gold grade a COPD At this time there is no indication for long-acting bronchodilators Stay physically active, practice good hand hygiene Use albuterol as needed for chest tightness wheezing or shortness of breath Prevnar vaccine today As we discussed today no smoking or vaping of any kind  Follow-up in 1 year or sooner if needed  Immunizations: Immunization History  Administered Date(s) Administered   Influenza,inj,Quad PF,6+ Mos 09/18/2021   Moderna Sars-Covid-2  Vaccination 02/27/2020, 03/27/2020   PNEUMOCOCCAL CONJUGATE-20 10/30/2021   Tdap 09/18/2021     Current Outpatient Medications:    Adalimumab (HUMIRA PEN) 40 MG/0.4ML PNKT, Inject 40 mg into the skin once a week. Starting on day 29., Disp: 2 each, Rfl: 6   clindamycin (CLINDAGEL) 1 % gel, Apply topically daily. Qd to aa rash groin, under breast, Disp: 60 g, Rfl: 4   Clobetasol Prop Emollient Base (CLOBETASOL PROPIONATE E) 0.05 % emollient cream, Apply to affected area qd, Disp: 60 g, Rfl: 9   FLUoxetine (PROZAC) 20 MG capsule, Take 1 capsule (20 mg total) by mouth daily., Disp: 30 capsule, Rfl: 3   HYDROcodone-acetaminophen (NORCO/VICODIN) 5-325 MG tablet, One tablet every six hours for pain.  Limit 7 days., Disp: 28 tablet, Rfl: 0   hydrOXYzine (VISTARIL) 50 MG capsule, Take 1 capsule (50 mg total) by mouth 3 (three) times daily as needed., Disp: 90 capsule, Rfl: 3   levothyroxine (SYNTHROID) 137 MCG tablet, Take 0.5 tablets (68.5 mcg total) by mouth daily before breakfast., Disp: 45 tablet, Rfl: 1   progesterone (PROMETRIUM) 100 MG capsule, TAKE 1 CAPSULE(100 MG) BY MOUTH AT BEDTIME, Disp: 30 capsule, Rfl: 2   Ruxolitinib Phosphate (OPZELURA) 1.5 % CREA, Apply to affected area qd, Disp: 60 g, Rfl: 6   umeclidinium-vilanterol (ANORO ELLIPTA) 62.5-25 MCG/ACT AEPB, Inhale 1 puff into the lungs daily., Disp: 30 each, Rfl: 5   VENTOLIN HFA 108 (90 Base) MCG/ACT inhaler, Inhale 2 puffs into the lungs every 6 (six) hours as needed for wheezing or shortness of breath., Disp: 6.7 g, Rfl: 5

## 2022-01-29 NOTE — Addendum Note (Signed)
Addended by: Irine Seal B on: 01/29/2022 04:13 PM   Modules accepted: Orders

## 2022-01-29 NOTE — Patient Instructions (Signed)
Centrilobular emphysema with mild airflow obstruction: Gold grade a COPD At this time there is no indication for long-acting bronchodilators Stay physically active, practice good hand hygiene Use albuterol as needed for chest tightness wheezing or shortness of breath Prevnar vaccine today As we discussed today no smoking or vaping of any kind  Follow-up in 1 year or sooner if needed

## 2022-02-04 ENCOUNTER — Encounter: Payer: Self-pay | Admitting: Pulmonary Disease

## 2022-02-04 ENCOUNTER — Ambulatory Visit (INDEPENDENT_AMBULATORY_CARE_PROVIDER_SITE_OTHER): Payer: Medicaid Other | Admitting: Orthopedic Surgery

## 2022-02-04 ENCOUNTER — Encounter: Payer: Self-pay | Admitting: Orthopedic Surgery

## 2022-02-04 DIAGNOSIS — M65312 Trigger thumb, left thumb: Secondary | ICD-10-CM | POA: Diagnosis not present

## 2022-02-04 DIAGNOSIS — M65311 Trigger thumb, right thumb: Secondary | ICD-10-CM | POA: Diagnosis not present

## 2022-02-04 DIAGNOSIS — M4722 Other spondylosis with radiculopathy, cervical region: Secondary | ICD-10-CM

## 2022-02-04 DIAGNOSIS — Z01818 Encounter for other preprocedural examination: Secondary | ICD-10-CM

## 2022-02-04 DIAGNOSIS — M542 Cervicalgia: Secondary | ICD-10-CM

## 2022-02-04 NOTE — Patient Instructions (Signed)
Your surgery will be at [facility] by Dr Harrison  The hospital will contact you with a preoperative appointment to discuss Anesthesia.  Please arrive on time or 15 minutes early for the preoperative appointment, they have a very tight schedule if you are late or do not come in your surgery will be cancelled.  The phone number is [phone]. Please bring your medications with you for the appointment. They will tell you the arrival time and medication instructions when you have your preoperative evaluation. Do not wear nail polish the day of your surgery and if you take Phentermine you need to stop this medication ONE WEEK prior to your surgery. If you take Invokana, Farxiga, Jardiance, or Steglatro) - Hold 72 hours before the procedure.  If you take Ozempic,  Bydureon or Trulicity do not take for 8 days before your surgery. If you take Victoza, Rybelsis, Saxenda or Adlyxi stop 24 hours before the procedure.  Please arrive at the hospital 2 hours before procedure if scheduled at 9:30 or later in the day or at the time the nurse tells you at your preoperative visit.   If you have my chart do not use the time given in my chart use the time given to you by the nurse during your preoperative visit.   Your surgery  time may change. Please be available for phone calls the day of your surgery and the day before. The Short Stay department may need to discuss changes about your surgery time. Not reaching the you could lead to procedure delays and possible cancellation.  You must have a ride home and someone to stay with you for 24 to 48 hours. The person taking you home will receive and sign for the your discharge instructions.  Please be prepared to give your support person's name and telephone number to [facility]. Dr Harrison will need that name and phone number post procedure.   

## 2022-02-04 NOTE — Telephone Encounter (Signed)
Left voicemail for patient to call back to discuss.

## 2022-02-04 NOTE — Progress Notes (Signed)
Chief Complaint  Patient presents with   Hand Problem    In office referral for possible trigger finger release left thumb    Dr. Luna Glasgow has advised the patient to see me for possible trigger finger release of the left thumb  She has had several injections and does not want to have another injection.  She is having some radicular symptoms in her right upper extremity.  She has seen Dr. Lorin Mercy who advised physical therapy.  The patient is having some issues with driving secondary to history of seizures and cannot get to the physical therapist without assistance from her children.  She is also having some other family issues that she is trying to resolve related to the death of her mother and grandmother.  In any event she has agreed to go to therapy for a one-time evaluation home exercise program and then periodic weekly checkups as needed to assess the effectiveness of the physical therapy  She will continue to take ibuprofen and I told her to take Tylenol arthritis in between the ibuprofen for pain and tingling and numbness of the right upper extremity  After the physical therapy has been completed we can send her to be reassessed by Dr. Lorin Mercy in terms of surgical intervention for the radicular pain  Last note from Dr. Lorin Mercy March 2023 Assessment & Plan: Visit Diagnoses:  1. Trigger thumb, right thumb   2. 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  She has persistent triggering of the thumb of the right hand she could have trigger thumb release.  Currently the injection has helped some with catching a little bit but is not as painful.  I plan to recheck her in 7 weeks.  We discussed single level cervical fusion if her symptoms do not improve with therapy.   Follow-Up Instructions: Return in about 7 weeks (around 06/12/2021).    As  far as the left hand goes.  The patient has a tender nodule over the left thumb she has some slight crepitance and clicking no locking.  The symptoms seem to be over the A1 pulley associated with trigger phenomenon.  The neurovascular exam of the hand is normal there is no flexor tendon issue in the extensor tendon at the IP joint is also normal.  She is consented to do a left thumb trigger finger release to be followed by a right trigger finger release at her convenience  The last injection was approximately 30 days ago so we should be fine in terms of postinjection timing for surgery  Plan open trigger release left thumb

## 2022-02-04 NOTE — Telephone Encounter (Signed)
Mychart message sent by pt: Zen A Albano "Toye"  P [facility] (supporting Juanito Doom, MD)20 minutes ago (10:14 AM)    Good morning, sorry for the delay, I wanted to inform you of something I observed again during my last visit to your office. During check-in, the CSR told me that I had a co-pay of $104.00, I have Medicaid, and I know it's only $ 4.00.  Unless it's changed. During check-out, I inquired about the $ 104. 00 balance. I was told there wasn't a balance on my account and she wasn't sure why I was given that information. Through causal conversation, I was told a similar incident had taken place with another patient there. So I'm just wondering if this CSR is pocketing $ 100.00 per Medicaid visit if she's stating to patients that has a past balance that consists of even numbers. When I asked for information as to how I had a balance, I was told not to worry about it, if I received a bill just take it to my local DSS office. As I stated I'm not sure but maybe something to keep an eye on. Thank you     Mel Almond, please advise.

## 2022-02-06 ENCOUNTER — Other Ambulatory Visit: Payer: Self-pay | Admitting: Internal Medicine

## 2022-02-06 ENCOUNTER — Encounter: Payer: Self-pay | Admitting: Dermatology

## 2022-02-06 DIAGNOSIS — L732 Hidradenitis suppurativa: Secondary | ICD-10-CM

## 2022-02-06 DIAGNOSIS — Z1231 Encounter for screening mammogram for malignant neoplasm of breast: Secondary | ICD-10-CM

## 2022-02-06 NOTE — Telephone Encounter (Signed)
Spoke with patient and explained the situation. Thanked patient for bringing this to our attention. While investigating, did notice patient was charged the full amount for her visit on 10/30/2021, while she had no insurance- informed patient I would contact billing to get the self pay discount applied. Spoke with billing department and they will put it in the workqueue and it should take about 2-3 weeks to update. Nothing further needed.

## 2022-02-09 MED ORDER — DOXYCYCLINE MONOHYDRATE 100 MG PO CAPS: ORAL_CAPSULE | ORAL | 5 refills | Status: DC

## 2022-02-09 MED ORDER — CLINDAMYCIN PHOSPHATE 1 % EX GEL: Freq: Every day | CUTANEOUS | 4 refills | Status: DC

## 2022-02-18 ENCOUNTER — Inpatient Hospital Stay: Admission: RE | Admit: 2022-02-18 | Payer: Medicaid Other | Source: Ambulatory Visit

## 2022-02-18 ENCOUNTER — Ambulatory Visit: Payer: Medicaid Other

## 2022-02-19 ENCOUNTER — Other Ambulatory Visit: Payer: Self-pay | Admitting: Internal Medicine

## 2022-02-19 ENCOUNTER — Other Ambulatory Visit: Payer: Self-pay

## 2022-02-19 MED ORDER — ACETAMINOPHEN ER 650 MG PO TBCR
650.0000 mg | EXTENDED_RELEASE_TABLET | Freq: Two times a day (BID) | ORAL | 0 refills | Status: DC | PRN
  Filled 2022-02-19: qty 120, 30d supply, fill #0

## 2022-02-19 NOTE — Patient Instructions (Signed)
Kimberly Rocha  02/19/2022     @PREFPERIOPPHARMACY @   Your procedure is scheduled on  02/24/2022.   Report to 02/26/2022 at  1035  A.M.   Call this number if you have problems the morning of surgery:  [phone]  If you experience any cold or flu symptoms such as cough, fever, chills, shortness of breath, etc. between now and your scheduled surgery, please notify [phone] at the above number.   Remember:  Do not eat or drink after midnight.         Use your inhaler before you come and bring your rescue inhaler with you.     Take these medicines the morning of surgery with A SIP OF WATER               prozac, hydroxyzine, levothyroxine.     Do not wear jewelry, make-up or nail polish.  Do not wear lotions, powders, or perfumes, or deodorant.  Do not shave 48 hours prior to surgery.  Men may shave face and neck.  Do not bring valuables to the hospital.  [facility] is not responsible for any belongings or valuables.  Contacts, dentures or bridgework may not be worn into surgery.  Leave your suitcase in the car.  After surgery it may be brought to your room.  For patients admitted to the hospital, discharge time will be determined by your treatment team.  Patients discharged the day of surgery will not be allowed to drive home and must have someone with them for 24 hours.    Special instructions:   DO NOT smoke tobacco or vape for 24 hours before your procedure.  Please read over the following fact sheets that you were given. Pain Booklet, Coughing and Deep Breathing, Surgical Site Infection Prevention, Anesthesia Post-op Instructions, and Care and Recovery After Surgery      Trigger Finger Release, Care After After trigger finger release, it is common to have: Stiffness. Soreness. Swelling. Follow these instructions at home: Incision care  Keep the compression bandage on for 48 hours or as told by your health care provider. After removing it, follow  instructions about how to take care of your incision. Make sure you: Wash your hands with soap and water for at least 20 seconds before and after you change your bandage (dressing). If soap and water are not available, use hand sanitizer. Change your dressing as told by your provider. Leave stitches (sutures), skin glue, or tape strips in place. These skin closures may need to stay in place for 2 weeks or longer. If tape strip edges start to loosen and curl up, you may trim the loose edges. Do not remove tape strips completely unless your provider tells you to do that. Keep your hand and dressing clean and dry. Check your incision area every day for signs of infection. Check for: More redness, swelling, or pain. Fluid or blood. Warmth. Pus or a bad smell. Bathing Do not take baths, swim, or use a hot tub until your provider approves. Keep your dressing dry until your provider says it can be removed. Cover it with a watertight covering when you take a bath or a shower. Managing pain, stiffness, and swelling  If told, put ice on your palm. Put ice in a plastic bag. Place a towel between your skin and the bag. Leave the ice on for 20 minutes, 2-3 times a day. If your skin turns bright red, remove the  ice right away to prevent skin damage. The risk of damage is higher if you cannot feel pain, heat, or cold. Move your fingers often to reduce stiffness and swelling. Raise (elevate) your hand above the level of your heart while you are sitting or lying down. Activity If you were given a sedative during the procedure, it can affect you for several hours. Do not drive or operate machinery until your provider says that it is safe. You may have to avoid lifting. Ask your provider how much you can safely lift. Avoid any activity that causes pain. It may take 4-6 months for stiffness to go away. Return to your normal activities as told by your provider. Ask your provider what activities are safe for  you. If hand therapy was prescribed, do exercises as told. This will help you regain movement. General instructions Take over-the-counter and prescription medicines only as told by your provider. Do not use any products that contain nicotine or tobacco. These products include cigarettes, chewing tobacco, and vaping devices, such as e-cigarettes. If you need help quitting, ask your provider. Keep all follow-up visits. If you have sutures, these will be removed in about 10-14 days. Your health care provider may give you more instructions. Make sure you know what you can and cannot do. Contact a health care provider if: You have chills or fever. You have any signs of infection. You are unable to move your finger because of pain or stiffness. You have any tingling or numbness in your hand or fingers. This information is not intended to replace advice given to you by your health care provider. Make sure you discuss any questions you have with your health care provider. Document Revised: 08/26/2021 Document Reviewed: 08/26/2021 Elsevier Patient Education  [place] The following information offers guidance on how to care for yourself after your procedure. Your health care provider may also give you more specific instructions. If you have problems or questions, contact your health care provider. What can I expect after the procedure? After the procedure, it is common to have: Tiredness. Little or no memory about what happened during or after the procedure. Impaired judgment when it comes to making decisions. Nausea or vomiting. Some trouble with balance. Follow these instructions at home: For the time period you were told by your health care provider:  Rest. Do not participate in activities where you could fall or become injured. Do not drive or use machinery. Do not drink alcohol. Do not take sleeping pills or medicines that cause  drowsiness. Do not make important decisions or sign legal documents. Do not take care of children on your own. Medicines Take over-the-counter and prescription medicines only as told by your health care provider. If you were prescribed antibiotics, take them as told by your health care provider. Do not stop using the antibiotic even if you start to feel better. Eating and drinking Follow instructions from your health care provider about what you may eat and drink. Drink enough fluid to keep your urine pale yellow. If you vomit: Drink clear fluids slowly and in small amounts as you are able. Clear fluids include water, ice chips, low-calorie sports drinks, and fruit juice that has water added to it (diluted fruit juice). Eat light and bland foods in small amounts as you are able. These foods include bananas, applesauce, rice, lean meats, toast, and crackers. General instructions  Have a responsible adult stay with you for the time you are told.  It is important to have someone help care for you until you are awake and alert. If you have sleep apnea, surgery and some medicines can increase your risk for breathing problems. Follow instructions from your health care provider about wearing your sleep device: When you are sleeping. This includes during daytime naps. While taking prescription pain medicines, sleeping medicines, or medicines that make you drowsy. Do not use any products that contain nicotine or tobacco. These products include cigarettes, chewing tobacco, and vaping devices, such as e-cigarettes. If you need help quitting, ask your health care provider. Contact a health care provider if: You feel nauseous or vomit every time you eat or drink. You feel light-headed. You are still sleepy or having trouble with balance after 24 hours. You get a rash. You have a fever. You have redness or swelling around the IV site. Get help right away if: You have trouble breathing. You have new  confusion after you get home. These symptoms may be an emergency. Get help right away. Call 911. Do not wait to see if the symptoms will go away. Do not drive yourself to the hospital. This information is not intended to replace advice given to you by your health care provider. Make sure you discuss any questions you have with your health care provider. Document Revised: 06/09/2021 Document Reviewed: 06/09/2021 Elsevier Patient Education  [place]. How to Use Chlorhexidine Before Surgery Chlorhexidine gluconate (CHG) is a germ-killing (antiseptic) solution that is used to clean the skin. It can get rid of the bacteria that normally live on the skin and can keep them away for about 24 hours. To clean your skin with CHG, you may be given: A CHG solution to use in the shower or as part of a sponge bath. A prepackaged cloth that contains CHG. Cleaning your skin with CHG may help lower the risk for infection: While you are staying in the intensive care unit of the hospital. If you have a vascular access, such as a central line, to provide short-term or long-term access to your veins. If you have a catheter to drain urine from your bladder. If you are on a ventilator. A ventilator is a machine that helps you breathe by moving air in and out of your lungs. After surgery. What are the risks? Risks of using CHG include: A skin reaction. Hearing loss, if CHG gets in your ears and you have a perforated eardrum. Eye injury, if CHG gets in your eyes and is not rinsed out. The CHG product catching fire. Make sure that you avoid smoking and flames after applying CHG to your skin. Do not use CHG: If you have a chlorhexidine allergy or have previously reacted to chlorhexidine. On babies younger than 31 months of age. How to use CHG solution Use CHG only as told by your health care provider, and follow the instructions on the label. Use the full amount of CHG as directed. Usually, this is one  bottle. During a shower Follow these steps when using CHG solution during a shower (unless your health care provider gives you different instructions): Start the shower. Use your normal soap and shampoo to wash your face and hair. Turn off the shower or move out of the shower stream. Pour the CHG onto a clean washcloth. Do not use any type of brush or rough-edged sponge. Starting at your neck, lather your body down to your toes. Make sure you follow these instructions: If you will be having surgery, pay special  attention to the part of your body where you will be having surgery. Scrub this area for at least 1 minute. Do not use CHG on your head or face. If the solution gets into your ears or eyes, rinse them well with water. Avoid your genital area. Avoid any areas of skin that have broken skin, cuts, or scrapes. Scrub your back and under your arms. Make sure to wash skin folds. Let the lather sit on your skin for 1-2 minutes or as long as told by your health care provider. Thoroughly rinse your entire body in the shower. Make sure that all body creases and crevices are rinsed well. Dry off with a clean towel. Do not put any substances on your body afterward--such as powder, lotion, or perfume--unless you are told to do so by your health care provider. Only use lotions that are recommended by the manufacturer. Put on clean clothes or pajamas. If it is the night before your surgery, sleep in clean sheets.  During a sponge bath Follow these steps when using CHG solution during a sponge bath (unless your health care provider gives you different instructions): Use your normal soap and shampoo to wash your face and hair. Pour the CHG onto a clean washcloth. Starting at your neck, lather your body down to your toes. Make sure you follow these instructions: If you will be having surgery, pay special attention to the part of your body where you will be having surgery. Scrub this area for at least 1  minute. Do not use CHG on your head or face. If the solution gets into your ears or eyes, rinse them well with water. Avoid your genital area. Avoid any areas of skin that have broken skin, cuts, or scrapes. Scrub your back and under your arms. Make sure to wash skin folds. Let the lather sit on your skin for 1-2 minutes or as long as told by your health care provider. Using a different clean, wet washcloth, thoroughly rinse your entire body. Make sure that all body creases and crevices are rinsed well. Dry off with a clean towel. Do not put any substances on your body afterward--such as powder, lotion, or perfume--unless you are told to do so by your health care provider. Only use lotions that are recommended by the manufacturer. Put on clean clothes or pajamas. If it is the night before your surgery, sleep in clean sheets. How to use CHG prepackaged cloths Only use CHG cloths as told by your health care provider, and follow the instructions on the label. Use the CHG cloth on clean, dry skin. Do not use the CHG cloth on your head or face unless your health care provider tells you to. When washing with the CHG cloth: Avoid your genital area. Avoid any areas of skin that have broken skin, cuts, or scrapes. Before surgery Follow these steps when using a CHG cloth to clean before surgery (unless your health care provider gives you different instructions): Using the CHG cloth, vigorously scrub the part of your body where you will be having surgery. Scrub using a back-and-forth motion for 3 minutes. The area on your body should be completely wet with CHG when you are done scrubbing. Do not rinse. Discard the cloth and let the area air-dry. Do not put any substances on the area afterward, such as powder, lotion, or perfume. Put on clean clothes or pajamas. If it is the night before your surgery, sleep in clean sheets.  For general bathing Follow these  steps when using CHG cloths for general  bathing (unless your health care provider gives you different instructions). Use a separate CHG cloth for each area of your body. Make sure you wash between any folds of skin and between your fingers and toes. Wash your body in the following order, switching to a new cloth after each step: The front of your neck, shoulders, and chest. Both of your arms, under your arms, and your hands. Your stomach and groin area, avoiding the genitals. Your right leg and foot. Your left leg and foot. The back of your neck, your back, and your buttocks. Do not rinse. Discard the cloth and let the area air-dry. Do not put any substances on your body afterward--such as powder, lotion, or perfume--unless you are told to do so by your health care provider. Only use lotions that are recommended by the manufacturer. Put on clean clothes or pajamas. Contact a health care provider if: Your skin gets irritated after scrubbing. You have questions about using your solution or cloth. You swallow any chlorhexidine. Call your local poison control center (248-670-2392 in the [place] Get help right away if: Your eyes itch badly, or they become very red or swollen. Your skin itches badly and is red or swollen. Your hearing changes. You have trouble seeing. You have swelling or tingling in your mouth or throat. You have trouble breathing. These symptoms may represent a serious problem that is an emergency. Do not wait to see if the symptoms will go away. Get medical help right away. Call your local emergency services (911 in the [place] Do not drive yourself to the hospital. Summary Chlorhexidine gluconate (CHG) is a germ-killing (antiseptic) solution that is used to clean the skin. Cleaning your skin with CHG may help to lower your risk for infection. You may be given CHG to use for bathing. It may be in a bottle or in a prepackaged cloth to use on your skin. Carefully follow your health care provider's instructions and the  instructions on the product label. Do not use CHG if you have a chlorhexidine allergy. Contact your health care provider if your skin gets irritated after scrubbing. This information is not intended to replace advice given to you by your health care provider. Make sure you discuss any questions you have with your health care provider. Document Revised: 05/12/2021 Document Reviewed: 03/25/2020 Elsevier Patient Education  2023 [employer].

## 2022-02-20 ENCOUNTER — Encounter (HOSPITAL_COMMUNITY): Payer: Self-pay

## 2022-02-20 ENCOUNTER — Encounter (HOSPITAL_COMMUNITY)
Admission: RE | Admit: 2022-02-20 | Discharge: 2022-02-20 | Disposition: A | Discharge status: Home / Self Care | Payer: Medicaid Other | Source: Ambulatory Visit | Attending: Orthopedic Surgery | Admitting: Orthopedic Surgery

## 2022-02-20 ENCOUNTER — Other Ambulatory Visit (HOSPITAL_COMMUNITY): Payer: Self-pay

## 2022-02-20 ENCOUNTER — Other Ambulatory Visit: Payer: Self-pay

## 2022-02-20 VITALS — BP 121/81 | HR 75 | Temp 97.8°F | Resp 18 | Ht 60.0 in | Wt 114.9 lb

## 2022-02-20 DIAGNOSIS — Z720 Tobacco use: Secondary | ICD-10-CM | POA: Diagnosis not present

## 2022-02-20 DIAGNOSIS — Z01818 Encounter for other preprocedural examination: Secondary | ICD-10-CM | POA: Diagnosis present

## 2022-02-20 DIAGNOSIS — M65312 Trigger thumb, left thumb: Secondary | ICD-10-CM

## 2022-02-20 HISTORY — DX: Depression, unspecified: F32.A

## 2022-02-20 HISTORY — DX: Chronic obstructive pulmonary disease, unspecified: J44.9

## 2022-02-20 HISTORY — DX: Anxiety disorder, unspecified: F41.9

## 2022-02-20 LAB — POCT PREGNANCY, URINE: Preg Test, Ur: NEGATIVE

## 2022-02-20 LAB — CBC WITH DIFFERENTIAL/PLATELET
Abs Immature Granulocytes: 0.02 10*3/uL (ref 0.00–0.07)
Basophils Absolute: 0 10*3/uL (ref 0.0–0.1)
Basophils Relative: 0 %
Eosinophils Absolute: 0 10*3/uL (ref 0.0–0.5)
Eosinophils Relative: 1 %
HCT: 34.2 % — ABNORMAL LOW (ref 36.0–46.0)
Hemoglobin: 11.7 g/dL — ABNORMAL LOW (ref 12.0–15.0)
Immature Granulocytes: 0 %
Lymphocytes Relative: 42 %
Lymphs Abs: 2.9 10*3/uL (ref 0.7–4.0)
MCH: 33.7 pg (ref 26.0–34.0)
MCHC: 34.2 g/dL (ref 30.0–36.0)
MCV: 98.6 fL (ref 80.0–100.0)
Monocytes Absolute: 0.4 10*3/uL (ref 0.1–1.0)
Monocytes Relative: 6 %
Neutro Abs: 3.5 10*3/uL (ref 1.7–7.7)
Neutrophils Relative %: 51 %
Platelets: 200 10*3/uL (ref 150–400)
RBC: 3.47 MIL/uL — ABNORMAL LOW (ref 3.87–5.11)
RDW: 14.5 % (ref 11.5–15.5)
WBC: 6.9 10*3/uL (ref 4.0–10.5)
nRBC: 0 % (ref 0.0–0.2)

## 2022-02-20 LAB — BASIC METABOLIC PANEL
Anion gap: 7 (ref 5–15)
BUN: 13 mg/dL (ref 6–20)
CO2: 26 mmol/L (ref 22–32)
Calcium: 8.7 mg/dL — ABNORMAL LOW (ref 8.9–10.3)
Chloride: 107 mmol/L (ref 98–111)
Creatinine, Ser: 0.98 mg/dL (ref 0.44–1.00)
GFR, Estimated: >60 mL/min (ref >60)
Glucose, Bld: 83 mg/dL (ref 70–99)
Potassium: 3.9 mmol/L (ref 3.5–5.1)
Sodium: 140 mmol/L (ref 135–145)

## 2022-02-23 NOTE — H&P (Signed)
Kimberly Rocha is an 47 y.o. female.   Chief Complaint: Pain left thumb HPI: This is a 47 year old female with pain over the A1 pulley of the left thumb with locking and triggering phenomenon was failed nonoperative care and wishes to proceed with a release of the A1 pulley of the left thumb  Past Medical History:  Diagnosis Date   Anxiety    Borderline high cholesterol    Borderline hypertension    COPD (chronic obstructive pulmonary disease) ([place])    Depression    Gallstones    Hyperthyroidism 09/17/2020   Thyroid disease     Past Surgical History:  Procedure Laterality Date   CESAREAN SECTION     CHOLECYSTECTOMY N/A 08/22/2012   Procedure: LAPAROSCOPIC CHOLECYSTECTOMY;  Surgeon: Jamesetta So, MD;  Location: [facility];  Service: General;  Laterality: N/A;   GALLBLADDER SURGERY     HERNIA REPAIR     TUBAL LIGATION      Family History  Problem Relation Age of Onset   COPD Mother    COPD Father    HIV/AIDS Brother    Stroke Maternal Grandmother    Other Daughter        has issues with cervix   Social History:  reports that she has quit smoking. Her smoking use included cigarettes. She smoked an average of .5 packs per day. She has never used smokeless tobacco. She reports current alcohol use. She reports current drug use. Frequency: 3.00 times per week. Drug: Marijuana.  Allergies:  Allergies  Allergen Reactions   Demerol Hives and Itching    No medications prior to admission.    No results found for this or any previous visit (from the past 48 hour(s)). No results found.  Review of Systems  There were no vitals taken for this visit. Physical Exam Vitals and nursing note reviewed.  Constitutional:      General: She is not in acute distress.    Appearance: Normal appearance. She is normal weight. She is not ill-appearing, toxic-appearing or diaphoretic.  HENT:     Head: Normocephalic and atraumatic.     Nose: No congestion or rhinorrhea.     Mouth/Throat:      Mouth: Mucous membranes are moist.     Pharynx: No oropharyngeal exudate or posterior oropharyngeal erythema.  Eyes:     General: No scleral icterus.       Right eye: No discharge.        Left eye: No discharge.     Extraocular Movements: Extraocular movements intact.     Conjunctiva/sclera: Conjunctivae normal.     Pupils: Pupils are equal, round, and reactive to light.  Cardiovascular:     Rate and Rhythm: Normal rate and regular rhythm.     Pulses: Normal pulses.  Pulmonary:     Effort: Pulmonary effort is normal.  Abdominal:     General: Abdomen is flat. There is no distension.     Palpations: Abdomen is soft.  Musculoskeletal:     Cervical back: Normal range of motion.  Skin:    Capillary Refill: Capillary refill takes more than 3 seconds.  Neurological:     General: No focal deficit present.     Mental Status: She is alert and oriented to person, place, and time. Mental status is at baseline.     Cranial Nerves: No cranial nerve deficit.     Sensory: No sensory deficit.     Motor: No weakness.     Coordination: Coordination normal.  Gait: Gait normal.     Deep Tendon Reflexes: Reflexes normal.  Psychiatric:        Mood and Affect: Mood normal.        Behavior: Behavior normal.        Thought Content: Thought content normal.        Judgment: Judgment normal.       As far as the left hand goes.  The patient has a tender nodule over the left thumb she has some slight crepitance and clicking no locking.  The symptoms seem to be over the A1 pulley associated with trigger phenomenon.  The neurovascular exam of the hand is normal there is no flexor tendon issue in the extensor tendon at the IP joint is also normal.   She is consented to do a left thumb trigger finger release to be followed by a right trigger finger release at her convenience   The last injection was approximately 30 days ago so we should be fine in terms of postinjection timing for surgery   Plan open  trigger release left thumb    Arther Abbott, MD 02/23/2022, 12:18 PM

## 2022-02-24 ENCOUNTER — Encounter (HOSPITAL_COMMUNITY): Payer: Self-pay | Admitting: Orthopedic Surgery

## 2022-02-24 ENCOUNTER — Ambulatory Visit (HOSPITAL_COMMUNITY): Payer: Medicaid Other | Admitting: Anesthesiology

## 2022-02-24 ENCOUNTER — Ambulatory Visit (HOSPITAL_COMMUNITY)
Admission: RE | Admit: 2022-02-24 | Discharge: 2022-02-24 | Disposition: A | Discharge status: Home / Self Care | Payer: Medicaid Other | Source: Ambulatory Visit | Attending: Orthopedic Surgery | Admitting: Orthopedic Surgery

## 2022-02-24 ENCOUNTER — Encounter (HOSPITAL_COMMUNITY)
Admission: RE | Disposition: A | Discharge status: Home / Self Care | Payer: Self-pay | Source: Ambulatory Visit | Attending: Orthopedic Surgery

## 2022-02-24 ENCOUNTER — Ambulatory Visit (HOSPITAL_BASED_OUTPATIENT_CLINIC_OR_DEPARTMENT_OTHER): Payer: Medicaid Other | Admitting: Anesthesiology

## 2022-02-24 DIAGNOSIS — M65311 Trigger thumb, right thumb: Secondary | ICD-10-CM

## 2022-02-24 DIAGNOSIS — Z87891 Personal history of nicotine dependence: Secondary | ICD-10-CM | POA: Insufficient documentation

## 2022-02-24 DIAGNOSIS — J449 Chronic obstructive pulmonary disease, unspecified: Secondary | ICD-10-CM | POA: Diagnosis not present

## 2022-02-24 DIAGNOSIS — M65312 Trigger thumb, left thumb: Secondary | ICD-10-CM

## 2022-02-24 DIAGNOSIS — M1990 Unspecified osteoarthritis, unspecified site: Secondary | ICD-10-CM

## 2022-02-24 HISTORY — PX: TRIGGER FINGER RELEASE: SHX641

## 2022-02-24 SURGERY — RELEASE, A1 PULLEY, FOR TRIGGER FINGER
Anesthesia: Regional | Site: Thumb | Laterality: Left

## 2022-02-24 MED ORDER — BUPIVACAINE HCL (PF) 0.5 % IJ SOLN
INTRAMUSCULAR | Status: DC | PRN
  Administered 2022-02-24: 10 mL

## 2022-02-24 MED ORDER — LACTATED RINGERS IV SOLN: INTRAVENOUS | Status: DC

## 2022-02-24 MED ORDER — DIPHENHYDRAMINE HCL 50 MG/ML IJ SOLN
INTRAMUSCULAR | Status: DC | PRN
  Administered 2022-02-24: 12.5 mg via INTRAVENOUS

## 2022-02-24 MED ORDER — BUPIVACAINE HCL (PF) 0.5 % IJ SOLN
INTRAMUSCULAR | Status: AC
  Filled 2022-02-24: qty 30

## 2022-02-24 MED ORDER — HYDROCODONE-ACETAMINOPHEN 5-325 MG PO TABS: 1.0000 | ORAL_TABLET | ORAL | 0 refills | Status: DC | PRN

## 2022-02-24 MED ORDER — PROPOFOL 10 MG/ML IV BOLUS
INTRAVENOUS | Status: AC
  Filled 2022-02-24: qty 20

## 2022-02-24 MED ORDER — CEFAZOLIN SODIUM-DEXTROSE 2-4 GM/100ML-% IV SOLN
2.0000 g | INTRAVENOUS | Status: AC
  Administered 2022-02-24: 2 g via INTRAVENOUS
  Filled 2022-02-24: qty 100

## 2022-02-24 MED ORDER — FENTANYL CITRATE (PF) 100 MCG/2ML IJ SOLN
INTRAMUSCULAR | Status: DC | PRN
  Administered 2022-02-24: 50 ug via INTRAVENOUS

## 2022-02-24 MED ORDER — SODIUM CHLORIDE 0.9 % IR SOLN
Status: DC | PRN
  Administered 2022-02-24: 1

## 2022-02-24 MED ORDER — LIDOCAINE HCL (PF) 0.5 % IJ SOLN
INTRAMUSCULAR | Status: DC | PRN
  Administered 2022-02-24: 40 mL via INTRAVENOUS

## 2022-02-24 MED ORDER — CHLORHEXIDINE GLUCONATE 0.12 % MT SOLN
15.0000 mL | Freq: Once | OROMUCOSAL | Status: AC
  Administered 2022-02-24: 15 mL via OROMUCOSAL

## 2022-02-24 MED ORDER — MIDAZOLAM HCL 2 MG/2ML IJ SOLN
INTRAMUSCULAR | Status: AC
  Filled 2022-02-24: qty 2

## 2022-02-24 MED ORDER — FENTANYL CITRATE (PF) 100 MCG/2ML IJ SOLN
INTRAMUSCULAR | Status: AC
  Filled 2022-02-24: qty 2

## 2022-02-24 MED ORDER — ORAL CARE MOUTH RINSE: 15.0000 mL | Freq: Once | OROMUCOSAL | Status: AC

## 2022-02-24 MED ORDER — MIDAZOLAM HCL 2 MG/2ML IJ SOLN
INTRAMUSCULAR | Status: DC | PRN
  Administered 2022-02-24 (×2): 1 mg via INTRAVENOUS

## 2022-02-24 MED ORDER — DIPHENHYDRAMINE HCL 50 MG/ML IJ SOLN
INTRAMUSCULAR | Status: AC
  Filled 2022-02-24: qty 1

## 2022-02-24 SURGICAL SUPPLY — 41 items
BANDAGE ESMARK 4X12 BL STRL LF (DISPOSABLE) ×1 IMPLANT
BLADE SURG 15 STRL LF DISP TIS (BLADE) ×1 IMPLANT
BLADE SURG 15 STRL SS (BLADE) ×1
BNDG COHESIVE 3X5 TAN STRL LF (GAUZE/BANDAGES/DRESSINGS) ×1 IMPLANT
BNDG COHESIVE 4X5 TAN STRL (GAUZE/BANDAGES/DRESSINGS) ×1 IMPLANT
BNDG CONFORM 2 STRL LF (GAUZE/BANDAGES/DRESSINGS) ×1 IMPLANT
BNDG CONFORM 6X.82 1P STRL (GAUZE/BANDAGES/DRESSINGS) IMPLANT
BNDG ELASTIC 2X5.8 VLCR NS LF (GAUZE/BANDAGES/DRESSINGS) ×1 IMPLANT
BNDG ELASTIC 2X5.8 VLCR STR LF (GAUZE/BANDAGES/DRESSINGS) IMPLANT
BNDG ESMARK 4X12 BLUE STRL LF (DISPOSABLE) ×1
CHLORAPREP W/TINT 26 (MISCELLANEOUS) ×1 IMPLANT
CLOTH BEACON ORANGE TIMEOUT ST (SAFETY) ×1 IMPLANT
COVER LIGHT HANDLE STERIS (MISCELLANEOUS) ×2 IMPLANT
CUFF TOURN SGL QUICK 18X4 (TOURNIQUET CUFF) ×1 IMPLANT
DECANTER SPIKE VIAL GLASS SM (MISCELLANEOUS) ×1 IMPLANT
DRAPE HALF SHEET 40X57 (DRAPES) ×1 IMPLANT
ELECT NDL TIP 2.8 STRL (NEEDLE) ×1 IMPLANT
ELECT NEEDLE TIP 2.8 STRL (NEEDLE) IMPLANT
ELECT REM PT RETURN 9FT ADLT (ELECTROSURGICAL) ×1
ELECTRODE REM PT RTRN 9FT ADLT (ELECTROSURGICAL) ×1 IMPLANT
GAUZE 4X4 16PLY ~~LOC~~+RFID DBL (SPONGE) ×1 IMPLANT
GAUZE SPONGE 4X4 12PLY STRL (GAUZE/BANDAGES/DRESSINGS) ×1 IMPLANT
GAUZE SPONGE 4X4 12PLY STRL LF (GAUZE/BANDAGES/DRESSINGS) IMPLANT
GAUZE XEROFORM 1X8 LF (GAUZE/BANDAGES/DRESSINGS) ×1 IMPLANT
GLOVE BIOGEL PI IND STRL 7.0 (GLOVE) ×2 IMPLANT
GLOVE SS N UNI LF 8.5 STRL (GLOVE) ×1 IMPLANT
GLOVE SURG POLYISO LF SZ8 (GLOVE) ×1 IMPLANT
GOWN STRL REUS W/TWL LRG LVL3 (GOWN DISPOSABLE) ×1 IMPLANT
GOWN STRL REUS W/TWL XL LVL3 (GOWN DISPOSABLE) ×1 IMPLANT
KIT TURNOVER KIT A (KITS) ×1 IMPLANT
MANIFOLD NEPTUNE II (INSTRUMENTS) ×1 IMPLANT
NDL HYPO 21X1.5 SAFETY (NEEDLE) ×1 IMPLANT
NEEDLE HYPO 21X1.5 SAFETY (NEEDLE) ×1 IMPLANT
NS IRRIG 1000ML POUR BTL (IV SOLUTION) ×1 IMPLANT
PACK BASIC LIMB (CUSTOM PROCEDURE TRAY) ×1 IMPLANT
PAD ARMBOARD 7.5X6 YLW CONV (MISCELLANEOUS) ×1 IMPLANT
POSITIONER HAND ALUMI XLG (MISCELLANEOUS) ×1 IMPLANT
SET BASIN LINEN APH (SET/KITS/TRAYS/PACK) ×1 IMPLANT
SPONGE GAUZE 2X2 8PLY STRL LF (GAUZE/BANDAGES/DRESSINGS) IMPLANT
SUT ETHILON 3 0 FSL (SUTURE) ×1 IMPLANT
SYR CONTROL 10ML LL (SYRINGE) ×1 IMPLANT

## 2022-02-24 NOTE — Brief Op Note (Signed)
02/24/2022  1:02 PM  PATIENT:  Kimberly Rocha  47 y.o. female  PRE-OPERATIVE DIAGNOSIS:  left thumb  trigger finger release  POST-OPERATIVE DIAGNOSIS:  left thumb trigger finger release  PROCEDURE:  Procedure(s): RELEASE TRIGGER FINGER/A-1 PULLEY left thumb (Left)  SURGEON:  Surgeon(s) and Role:    Carole Civil, MD - Primary  PHYSICIAN ASSISTANT:   ASSISTANTS: no   ANESTHESIA:   regional  EBL:  none   BLOOD ADMINISTERED:e  DRAINS: none   LOCAL MEDICATIONS USED:  MARCAINE     SPECIMEN:  No Specimen  DISPOSITION OF SPECIMEN:  N/A  COUNTS:  YES  TOURNIQUET:   Total Tourniquet Time Documented: Thigh (Right) - 27 minutes Total: Thigh (Right) - 27 minutes   DICTATION: .Viviann Spare Dictation  PLAN OF CARE: Discharge to home after PACU  PATIENT DISPOSITION:  PACU - hemodynamically stable.   Delay start of Pharmacological VTE agent (>24hrs) due to surgical blood loss or risk of bleeding: not applicable

## 2022-02-24 NOTE — Anesthesia Postprocedure Evaluation (Signed)
Anesthesia Post Note  Patient: Kimberly Rocha  Procedure(s) Performed: RELEASE TRIGGER FINGER/A-1 PULLEY left thumb (Left: Thumb)  Patient location during evaluation: Phase II Anesthesia Type: Bier Block Level of consciousness: awake and alert and oriented Pain management: pain level controlled Vital Signs Assessment: post-procedure vital signs reviewed and stable Respiratory status: spontaneous breathing, nonlabored ventilation and respiratory function stable Cardiovascular status: blood pressure returned to baseline and stable Postop Assessment: no apparent nausea or vomiting Anesthetic complications: no  No notable events documented.   Last Vitals:  Vitals:   02/24/22 1102 02/24/22 1312  BP: 126/89 137/86  Pulse: 73 82  Resp: (!) 24 14  Temp: 36.9 C 36.8 C  SpO2: 100% 99%    Last Pain:  Vitals:   02/24/22 1312  TempSrc: Oral  PainSc: 0-No pain                 Ally Knodel C Diahann Guajardo

## 2022-02-24 NOTE — Anesthesia Preprocedure Evaluation (Signed)
Anesthesia Evaluation  Patient identified by MRN, date of birth, ID band Patient awake    Reviewed: Allergy & Precautions, H&P , NPO status , Patient's Chart, lab work & pertinent test results  Airway Mallampati: II  TM Distance: >3 FB Neck ROM: Full    Dental  (+) Dental Advisory Given, Teeth Intact   Pulmonary COPD, former smoker, PE   Pulmonary exam normal breath sounds clear to auscultation       Cardiovascular + DOE  Normal cardiovascular exam Rhythm:Regular Rate:Normal  1. Left ventricular ejection fraction, by estimation, is 65 to 70%. The  left ventricle has normal function. The left ventricle has no regional  wall motion abnormalities. Left ventricular diastolic parameters are  indeterminate.   2. Right ventricular systolic function is normal. The right ventricular  size is normal.   3. The mitral valve is normal in structure. No evidence of mitral valve  regurgitation. No evidence of mitral stenosis.   4. The aortic valve is tricuspid. Aortic valve regurgitation is not  visualized. No aortic stenosis is present.   5. The inferior vena cava is normal in size with greater than 50%  respiratory variability, suggesting right atrial pressure of 3 mmHg.     Neuro/Psych  PSYCHIATRIC DISORDERS Anxiety Depression    TIA Neuromuscular disease CVA (right sided weakness), Residual Symptoms    GI/Hepatic negative GI ROS,,,(+)     substance abuse  marijuana use  Endo/Other  Hypothyroidism    Renal/GU negative Renal ROS  negative genitourinary   Musculoskeletal  (+) Arthritis , Osteoarthritis,    Abdominal   Peds negative pediatric ROS (+)  Hematology negative hematology ROS (+)   Anesthesia Other Findings   Reproductive/Obstetrics negative OB ROS                             Anesthesia Physical Anesthesia Plan  ASA: 3  Anesthesia Plan: Bier Block and Bier Block-Lidocaine Only    Post-op Pain Management: Minimal or no pain anticipated and Dilaudid IV   Induction: Intravenous  PONV Risk Score and Plan: 3 and Ondansetron, Propofol infusion and Midazolam  Airway Management Planned:   Additional Equipment:   Intra-op Plan:   Post-operative Plan:   Informed Consent: I have reviewed the patients History and Physical, chart, labs and discussed the procedure including the risks, benefits and alternatives for the proposed anesthesia with the patient or authorized representative who has indicated his/her understanding and acceptance.     Dental advisory given  Plan Discussed with: Surgeon and CRNA  Anesthesia Plan Comments: (Possible GA with airway was explained )       Anesthesia Quick Evaluation

## 2022-02-24 NOTE — Interval H&P Note (Signed)
History and Physical Interval Note:  02/24/2022 12:18 PM  Kimberly Rocha  has presented today for surgery, with the diagnosis of left thumb  trigger finger release.  The various methods of treatment have been discussed with the patient and family. After consideration of risks, benefits and other options for treatment, the patient has consented to  Procedure(s): RELEASE TRIGGER FINGER/A-1 PULLEY left thumb (Left) as a surgical intervention.  The patient's history has been reviewed, patient examined, no change in status, stable for surgery.  I have reviewed the patient's chart and labs.  Questions were answered to the patient's satisfaction.     Arther Abbott

## 2022-02-24 NOTE — Op Note (Signed)
OPERATIVE REPORT   02/24/2022 1:03 PM Arther Abbott, MD   Preop diagnosis trigger thumb left hand  Postop diagnosis same  Procedure release A1 pulley left thumb Surgeon Aline Brochure Anesthesia Bier block Operation findings: Stenosing tenosynovitis flexor tendon A1 pulley left thumb No assistants Counts were correct Clean case no specimen 10 mL of Marcaine  injected after the case Patient to recovery room patient's stable condition  The procedure was performed as follows  The patient was identified in the preop area and the surgical site was confirmed and marked, chart update was completed patient taken to surgery given appropriate antibiotics based on her allergy profile  After successful Bier block in sterile prep and drape timeout was completed  A longitudinal incision was made over the A1 pulley of the left thumb, then the subcutaneous tissue was divided blunt dissection was carried out to protect neurovascular structures. A blunt instrument was placed underneath the A1 pulley and the A1 pulley was released. Flexion extension of the digit confirmed removal of mechanical block. Wound was irrigated and closed with 3-0 nylon suture  We took the patient to recovery room in stable condition  Arther Abbott, MD

## 2022-02-24 NOTE — Interval H&P Note (Signed)
History and Physical Interval Note:  02/24/2022 12:18 PM  Kimberly Rocha  has presented today for surgery, with the diagnosis of left thumb  trigger finger release.  The various methods of treatment have been discussed with the patient and family. After consideration of risks, benefits and other options for treatment, the patient has consented to  Procedure(s): RELEASE TRIGGER FINGER/A-1 PULLEY left thumb (Left) as a surgical intervention.  The patient's history has been reviewed, patient examined, no change in status, stable for surgery.  I have reviewed the patient's chart and labs.  Questions were answered to the patient's satisfaction.     Angelly Spearing   

## 2022-02-24 NOTE — Transfer of Care (Signed)
Immediate Anesthesia Transfer of Care Note  Patient: Kimberly Rocha  Procedure(s) Performed: RELEASE TRIGGER FINGER/A-1 PULLEY left thumb (Left: Thumb)  Patient Location: PACU  Anesthesia Type:Bier block  Level of Consciousness: awake  Airway & Oxygen Therapy: Patient Spontanous Breathing  Post-op Assessment: Report given to RN and Post -op Vital signs reviewed and stable  Post vital signs: Reviewed and stable  Last Vitals:  Vitals Value Taken Time  BP    Temp    Pulse    Resp    SpO2      Last Pain:  Vitals:   02/24/22 1102  TempSrc: Oral  PainSc: 0-No pain      Patients Stated Pain Goal: 5 (07/21/92 8546)  Complications: No notable events documented.

## 2022-03-02 ENCOUNTER — Encounter (HOSPITAL_COMMUNITY): Payer: Self-pay | Admitting: Orthopedic Surgery

## 2022-03-10 ENCOUNTER — Ambulatory Visit: Payer: Medicaid Other | Admitting: Nurse Practitioner

## 2022-03-11 ENCOUNTER — Encounter: Payer: Medicaid Other | Admitting: Orthopedic Surgery

## 2022-03-11 ENCOUNTER — Encounter: Payer: Self-pay | Admitting: Orthopedic Surgery

## 2022-03-16 ENCOUNTER — Other Ambulatory Visit: Payer: Self-pay | Admitting: Internal Medicine

## 2022-03-16 DIAGNOSIS — F418 Other specified anxiety disorders: Secondary | ICD-10-CM

## 2022-03-18 ENCOUNTER — Other Ambulatory Visit: Payer: Self-pay

## 2022-03-18 ENCOUNTER — Telehealth: Payer: Self-pay | Admitting: Internal Medicine

## 2022-03-18 DIAGNOSIS — R232 Flushing: Secondary | ICD-10-CM

## 2022-03-18 DIAGNOSIS — F418 Other specified anxiety disorders: Secondary | ICD-10-CM

## 2022-03-18 DIAGNOSIS — F410 Panic disorder [episodic paroxysmal anxiety] without agoraphobia: Secondary | ICD-10-CM

## 2022-03-18 MED ORDER — ACETAMINOPHEN ER 650 MG PO TBCR: 650.0000 mg | EXTENDED_RELEASE_TABLET | Freq: Two times a day (BID) | ORAL | 0 refills | Status: DC | PRN

## 2022-03-18 MED ORDER — PROGESTERONE MICRONIZED 100 MG PO CAPS: ORAL_CAPSULE | ORAL | 2 refills | Status: DC

## 2022-03-18 MED ORDER — FLUOXETINE HCL 20 MG PO CAPS: 20.0000 mg | ORAL_CAPSULE | Freq: Every day | ORAL | 3 refills | Status: DC

## 2022-03-18 NOTE — Telephone Encounter (Signed)
Refills sent

## 2022-03-18 NOTE — Telephone Encounter (Signed)
Having issues with phar. Can you please refill all medication by Dr. Posey Pronto? Other phar keeps messing up her medications.    Kimberly Rocha on [place]

## 2022-03-19 ENCOUNTER — Ambulatory Visit (INDEPENDENT_AMBULATORY_CARE_PROVIDER_SITE_OTHER): Payer: Medicaid Other | Admitting: Orthopedic Surgery

## 2022-03-19 DIAGNOSIS — M65312 Trigger thumb, left thumb: Secondary | ICD-10-CM

## 2022-03-19 DIAGNOSIS — Z9889 Other specified postprocedural states: Secondary | ICD-10-CM

## 2022-03-19 NOTE — Progress Notes (Signed)
Chief Complaint  Patient presents with   Routine Post Op    Lt hand trigger release DOS 02/24/22    Encounter Diagnoses  Name Primary?   Trigger thumb, left thumb    Status post trigger finger release Yes    Kimberly Rocha comes in 3 weeks after surgery sutures already removed.  She said she was sick and did not come into the office while she was sick so she took the stitches out herself.  Fortunately she got a mild without any signs of infection catching locking resolved  She discussed with me her right thumb but is currently asymptomatic so we recommend no treatment at this time follow-up with [place] as needed

## 2022-03-26 ENCOUNTER — Encounter: Payer: Self-pay | Admitting: Radiology

## 2022-03-27 ENCOUNTER — Encounter: Payer: Self-pay | Admitting: Dermatology

## 2022-03-27 ENCOUNTER — Telehealth: Payer: Self-pay | Admitting: Nurse Practitioner

## 2022-03-27 DIAGNOSIS — E890 Postprocedural hypothyroidism: Secondary | ICD-10-CM

## 2022-03-27 MED ORDER — LEVOTHYROXINE SODIUM 137 MCG PO TABS: 62.5000 ug | ORAL_TABLET | Freq: Every day | ORAL | 1 refills | Status: DC

## 2022-03-27 NOTE — Telephone Encounter (Signed)
Pt stopped by and said she had to move her appt out bc she owes the lab money so she is coming April 4. Can you refill her levothyroxine (SYNTHROID) 137 MCG tablet to [facility] on [address]

## 2022-03-27 NOTE — Telephone Encounter (Signed)
done 

## 2022-03-29 ENCOUNTER — Other Ambulatory Visit: Payer: Self-pay | Admitting: Internal Medicine

## 2022-03-29 DIAGNOSIS — F418 Other specified anxiety disorders: Secondary | ICD-10-CM

## 2022-03-30 ENCOUNTER — Other Ambulatory Visit: Payer: Self-pay

## 2022-03-30 MED ORDER — HUMIRA (2 PEN) 80 MG/0.8ML ~~LOC~~ PNKT: 80.0000 mg | PEN_INJECTOR | SUBCUTANEOUS | 6 refills | Status: DC

## 2022-03-30 NOTE — Progress Notes (Signed)
Humira to [facility]

## 2022-04-01 ENCOUNTER — Encounter: Payer: Self-pay | Admitting: Pulmonary Disease

## 2022-04-01 NOTE — Telephone Encounter (Signed)
Called and spoke w/ pt she verbalized that had a question for Dr.McQuaid. I let her know that Dr.McQuaid is no longer at our clinic but we are committed to providing each pt with the best care and that I can send any questions to another provider. She verbalized understanding and states that she will call back later with her questions if necessary.

## 2022-04-02 ENCOUNTER — Ambulatory Visit: Payer: Medicaid Other | Admitting: Nurse Practitioner

## 2022-04-14 ENCOUNTER — Ambulatory Visit (INDEPENDENT_AMBULATORY_CARE_PROVIDER_SITE_OTHER): Payer: Medicaid Other | Admitting: Dermatology

## 2022-04-14 VITALS — BP 166/101 | HR 89

## 2022-04-14 DIAGNOSIS — L732 Hidradenitis suppurativa: Secondary | ICD-10-CM | POA: Diagnosis not present

## 2022-04-14 MED ORDER — SPIRONOLACTONE 100 MG PO TABS: ORAL_TABLET | ORAL | 1 refills | Status: DC

## 2022-04-14 NOTE — Patient Instructions (Signed)
Due to recent changes in healthcare laws, you may see results of your pathology and/or laboratory studies on MyChart before the doctors have had a chance to review them. We understand that in some cases there may be results that are confusing or concerning to you. Please understand that not all results are received at the same time and often the doctors may need to interpret multiple results in order to provide you with the best plan of care or course of treatment. Therefore, we ask that you please give us 2 business days to thoroughly review all your results before contacting the office for clarification. Should we see a critical lab result, you will be contacted sooner.   If You Need Anything After Your Visit  If you have any questions or concerns for your doctor, please call our main line at [phone] and press option 4 to reach your doctor's medical assistant. If no one answers, please leave a voicemail as directed and we will return your call as soon as possible. Messages left after 4 pm will be answered the following business day.   You may also send us a message via MyChart. We typically respond to MyChart messages within 1-2 business days.  For prescription refills, please ask your pharmacy to contact our office. Our fax number is [phone].  If you have an urgent issue when the clinic is closed that cannot wait until the next business day, you can page your doctor at the number below.    Please note that while we do our best to be available for urgent issues outside of office hours, we are not available 24/7.   If you have an urgent issue and are unable to reach us, you may choose to seek medical care at your doctor's office, retail clinic, urgent care center, or emergency room.  If you have a medical emergency, please immediately call 911 or go to the emergency department.  Pager Numbers  - Dr. Kowalski: [phone]  - Dr. Moye: [phone]  - Dr. Stewart:  [phone]  In the event of inclement weather, please call our main line at [phone] for an update on the status of any delays or closures.  Dermatology Medication Tips: Please keep the boxes that topical medications come in in order to help keep track of the instructions about where and how to use these. Pharmacies typically print the medication instructions only on the boxes and not directly on the medication tubes.   If your medication is too expensive, please contact our office at [phone] option 4 or send us a message through MyChart.   We are unable to tell what your co-pay for medications will be in advance as this is different depending on your insurance coverage. However, we may be able to find a substitute medication at lower cost or fill out paperwork to get insurance to cover a needed medication.   If a prior authorization is required to get your medication covered by your insurance company, please allow us 1-2 business days to complete this process.  Drug prices often vary depending on where the prescription is filled and some pharmacies may offer cheaper prices.  The website www.goodrx.com contains coupons for medications through different pharmacies. The prices here do not account for what the cost may be with help from insurance (it may be cheaper with your insurance), but the website can give you the price if you did not use any insurance.  - You can print the associated coupon and take it with   your prescription to the pharmacy.  - You may also stop by our office during regular business hours and pick up a GoodRx coupon card.  - If you need your prescription sent electronically to a different pharmacy, notify our office through [facility] MyChart or by phone at [phone] option 4.     Si Usted Necesita Algo Despus de Su Visita  Tambin puede enviarnos un mensaje a travs de MyChart. Por lo general respondemos a los mensajes de MyChart en el transcurso de 1 a 2  das hbiles.  Para renovar recetas, por favor pida a su farmacia que se ponga en contacto con nuestra oficina. Nuestro nmero de fax es el [phone].  Si tiene un asunto urgente cuando la clnica est cerrada y que no puede esperar hasta el siguiente da hbil, puede llamar/localizar a su doctor(a) al nmero que aparece a continuacin.   Por favor, tenga en cuenta que aunque hacemos todo lo posible para estar disponibles para asuntos urgentes fuera del horario de oficina, no estamos disponibles las 24 horas del da, los 7 das de la semana.   Si tiene un problema urgente y no puede comunicarse con nosotros, puede optar por buscar atencin mdica  en el consultorio de su doctor(a), en una clnica privada, en un centro de atencin urgente o en una sala de emergencias.  Si tiene una emergencia mdica, por favor llame inmediatamente al 911 o vaya a la sala de emergencias.  Nmeros de bper  - Dr. Kowalski: [phone]  - Dra. Moye: [phone]  - Dra. Stewart: [phone]  En caso de inclemencias del tiempo, por favor llame a nuestra lnea principal al [phone] para una actualizacin sobre el estado de cualquier retraso o cierre.  Consejos para la medicacin en dermatologa: Por favor, guarde las cajas en las que vienen los medicamentos de uso tpico para ayudarle a seguir las instrucciones sobre dnde y cmo usarlos. Las farmacias generalmente imprimen las instrucciones del medicamento slo en las cajas y no directamente en los tubos del medicamento.   Si su medicamento es muy caro, por favor, pngase en contacto con nuestra oficina llamando al [phone] y presione la opcin 4 o envenos un mensaje a travs de MyChart.   No podemos decirle cul ser su copago por los medicamentos por adelantado ya que esto es diferente dependiendo de la cobertura de su seguro. Sin embargo, es posible que podamos encontrar un medicamento sustituto a menor costo o llenar un formulario para que el  seguro cubra el medicamento que se considera necesario.   Si se requiere una autorizacin previa para que su compaa de seguros cubra su medicamento, por favor permtanos de 1 a 2 das hbiles para completar este proceso.  Los precios de los medicamentos varan con frecuencia dependiendo del lugar de dnde se surte la receta y alguna farmacias pueden ofrecer precios ms baratos.  El sitio web www.goodrx.com tiene cupones para medicamentos de diferentes farmacias. Los precios aqu no tienen en cuenta lo que podra costar con la ayuda del seguro (puede ser ms barato con su seguro), pero el sitio web puede darle el precio si no utiliz ningn seguro.  - Puede imprimir el cupn correspondiente y llevarlo con su receta a la farmacia.  - Tambin puede pasar por nuestra oficina durante el horario de atencin regular y recoger una tarjeta de cupones de GoodRx.  - Si necesita que su receta se enve electrnicamente a una farmacia diferente, informe a nuestra oficina a travs de MyChart de [place]   o por telfono llamando al [phone] y presione la opcin 4.  

## 2022-04-14 NOTE — Progress Notes (Signed)
Follow-Up Visit   Subjective  Kimberly Rocha is a 47 y.o. female who presents for the following: Follow-up (3 months f/u HS (Groin, axilla, inframammary Humira sq injections q wk, started in 06/2021 with a poor response, skin is no better. Patient using Clindamycin gel and Opzelura cream prn flares with a poor response, past med Doxycycline no help). Patient report worse around her menstrual cycle. No side effects from Humira.  Still itching a lot.   The following portions of the chart were reviewed this encounter and updated as appropriate:      Review of Systems:  No other skin or systemic complaints except as noted in HPI or Assessment and Plan.  Objective  Well appearing patient in no apparent distress; mood and affect are within normal limits.  A focused examination was performed including groin, axilla, inframammary. Relevant physical exam findings are noted in the Assessment and Plan.  groin, axilla, inframammary Scarring at the labial and numerous hyperpigmented macules Follicular hyperpigmented areas in the suprapubic area and groin     Assessment & Plan  Hidradenitis suppurativa groin, axilla, inframammary  With Irritant vs Atopic Dermatitis vulva/super pubic area (has itching in area). Chronic and persistent condition with duration or expected duration over one year. Condition is symptomatic/ bothersome to patient. Not currently at goal.    Hidradenitis Suppurativa is a chronic; persistent; non-curable, but treatable condition due to abnormal inflamed sweat glands in the body folds (axilla, inframammary, groin, medial thighs), causing recurrent painful draining cysts and scarring. It can be associated with severe scarring acne and cysts; also abscesses and scarring of scalp. The goal is control and prevention of flares, as it is not curable. Scars are permanent and can be thickened. Treatment may include daily use of topical medication and oral antibiotics.  Oral isotretinoin  may also be helpful.  For more severe cases, Humira or Cosentyx (biologic injections) may be prescribed to decrease the inflammatory process and prevent flares.  When indicated, inflamed cysts may also be treated surgically  Continue Humira injection 80 mg 2 wks Continue Clindamycin gel qd  Continue Opzelura bid for itching Avoid tweezing hairs Plan on starting Isotretinoin therapy in 1 month   Isotretinoin Counseling; Review and Contraception Counseling: Reviewed potential side effects of isotretinoin including xerosis, cheilitis, hepatitis, hyperlipidemia, and severe birth defects if taken by a pregnant woman.  Women on isotretinoin must be celibate (not having sex) or required to use at least 2 birth control methods to prevent pregnancy (unless patient is a female of non-child bearing potential).  Females of child-bearing potential must have monthly pregnancy tests while on isotretinoin and report through [employer]-Pledge (FDA monitoring program). Reviewed reports of suicidal ideation in those with a history of depression while taking isotretinoin and reports of diagnosis of inflammatory bowl disease (IBD) while taking isotretinoin as well as the lack of evidence for a causal relationship between isotretinoin, depression and IBD. Patient advised to reach out with any questions or concerns. Patient advised not to share pills or donate blood while on treatment or for one month after completing treatment. All patient's considering Isotretinoin must read and understand and sign Isotretinoin Consent Form and be registered with [employer]-Pledge.   Urine pregnancy test performed in office today and was negative.  Patient demonstrates comprehension and confirms she will not get pregnant.    2 forms of Birth control tubal ligation, female latex condom  Start Spironolactone 100 mg take 1/2 tablet daily for 1 week, may increase to 1 tablet  after 1 week  Spironolactone can cause increased urination and cause blood pressure  to decrease. Please watch for signs of lightheadedness and be cautious when changing position. It can sometimes cause breast tenderness or an irregular period in premenopausal women. It can also increase potassium. The increase in potassium usually is not a concern unless you are taking other medicines that also increase potassium, so please be sure your doctor knows all of the other medications you are taking. This medication should not be taken by pregnant women.  This medicine should also not be taken together with sulfa drugs like Bactrim (trimethoprim/sulfamethexazole).    Labs ordered CMP, Lipid panel, QuantiFeron TB gold   Related Procedures Comprehensive metabolic panel Lipid panel QuantiFERON-TB Gold Plus  Related Medications clindamycin (CLINDAGEL) 1 % gel Apply topically daily. Qd to aa rash groin, under breast  spironolactone (ALDACTONE) 100 MG tablet Take 1 tablet daily   Return in about 4 weeks (around 05/12/2022) for isotretinoin start, will address Alopecia on f/up.  I, Marye Round, CMA, am acting as scribe for Brendolyn Patty, MD .   Documentation: I have reviewed the above documentation for accuracy and completeness, and I agree with the above.  Brendolyn Patty MD

## 2022-04-14 NOTE — Progress Notes (Deleted)
   Follow-Up Visit   Subjective  Toye A Benitz is a 47 y.o. female who presents for the following: Follow-up (3 months f/u HS (Groin, axilla, inframammary Humira sq injections q wk, started in 06/2021 with a poor response, skin is no better. ).   The following portions of the chart were reviewed this encounter and updated as appropriate:       Review of Systems:  No other skin or systemic complaints except as noted in HPI or Assessment and Plan.  Objective  Well appearing patient in no apparent distress; mood and affect are within normal limits.  A focused examination was performed including groin, axilla, inframammary. Relevant physical exam findings are noted in the Assessment and Plan.    Assessment & Plan   No follow-ups on file.  I, Marye Round, CMA, am acting as scribe for Brendolyn Patty, MD .

## 2022-04-15 ENCOUNTER — Other Ambulatory Visit (HOSPITAL_COMMUNITY)
Admission: RE | Admit: 2022-04-15 | Discharge: 2022-04-15 | Disposition: A | Discharge status: Home / Self Care | Payer: Medicaid Other | Source: Ambulatory Visit | Attending: Nurse Practitioner | Admitting: Nurse Practitioner

## 2022-04-15 ENCOUNTER — Telehealth: Payer: Self-pay

## 2022-04-15 ENCOUNTER — Other Ambulatory Visit (HOSPITAL_COMMUNITY)
Admission: RE | Admit: 2022-04-15 | Discharge: 2022-04-15 | Disposition: A | Discharge status: Home / Self Care | Payer: Medicaid Other | Source: Ambulatory Visit | Attending: Internal Medicine | Admitting: Internal Medicine

## 2022-04-15 ENCOUNTER — Other Ambulatory Visit (HOSPITAL_COMMUNITY)
Admission: RE | Admit: 2022-04-15 | Discharge: 2022-04-15 | Disposition: A | Discharge status: Home / Self Care | Payer: Medicaid Other | Source: Ambulatory Visit | Attending: Dermatology | Admitting: Dermatology

## 2022-04-15 ENCOUNTER — Encounter: Payer: Self-pay | Admitting: Dermatology

## 2022-04-15 DIAGNOSIS — E559 Vitamin D deficiency, unspecified: Secondary | ICD-10-CM | POA: Diagnosis not present

## 2022-04-15 DIAGNOSIS — Z0001 Encounter for general adult medical examination with abnormal findings: Secondary | ICD-10-CM | POA: Insufficient documentation

## 2022-04-15 DIAGNOSIS — L732 Hidradenitis suppurativa: Secondary | ICD-10-CM | POA: Diagnosis not present

## 2022-04-15 DIAGNOSIS — F410 Panic disorder [episodic paroxysmal anxiety] without agoraphobia: Secondary | ICD-10-CM | POA: Insufficient documentation

## 2022-04-15 DIAGNOSIS — R739 Hyperglycemia, unspecified: Secondary | ICD-10-CM | POA: Insufficient documentation

## 2022-04-15 DIAGNOSIS — E782 Mixed hyperlipidemia: Secondary | ICD-10-CM | POA: Insufficient documentation

## 2022-04-15 DIAGNOSIS — E890 Postprocedural hypothyroidism: Secondary | ICD-10-CM | POA: Insufficient documentation

## 2022-04-15 LAB — CBC WITH DIFFERENTIAL/PLATELET
Abs Immature Granulocytes: 0.02 10*3/uL (ref 0.00–0.07)
Basophils Absolute: 0 10*3/uL (ref 0.0–0.1)
Basophils Relative: 0 %
Eosinophils Absolute: 0.1 10*3/uL (ref 0.0–0.5)
Eosinophils Relative: 1 %
HCT: 36.5 % (ref 36.0–46.0)
Hemoglobin: 12.6 g/dL (ref 12.0–15.0)
Immature Granulocytes: 0 %
Lymphocytes Relative: 34 %
Lymphs Abs: 2.7 10*3/uL (ref 0.7–4.0)
MCH: 34 pg (ref 26.0–34.0)
MCHC: 34.5 g/dL (ref 30.0–36.0)
MCV: 98.4 fL (ref 80.0–100.0)
Monocytes Absolute: 0.6 10*3/uL (ref 0.1–1.0)
Monocytes Relative: 8 %
Neutro Abs: 4.5 10*3/uL (ref 1.7–7.7)
Neutrophils Relative %: 57 %
Platelets: 234 10*3/uL (ref 150–400)
RBC: 3.71 MIL/uL — ABNORMAL LOW (ref 3.87–5.11)
RDW: 15 % (ref 11.5–15.5)
WBC: 7.9 10*3/uL (ref 4.0–10.5)
nRBC: 0 % (ref 0.0–0.2)

## 2022-04-15 LAB — COMPREHENSIVE METABOLIC PANEL
ALT: 14 U/L (ref 0–44)
AST: 17 U/L (ref 15–41)
Albumin: 4 g/dL (ref 3.5–5.0)
Alkaline Phosphatase: 65 U/L (ref 38–126)
Anion gap: 6 (ref 5–15)
BUN: 10 mg/dL (ref 6–20)
CO2: 23 mmol/L (ref 22–32)
Calcium: 8.7 mg/dL — ABNORMAL LOW (ref 8.9–10.3)
Chloride: 108 mmol/L (ref 98–111)
Creatinine, Ser: 0.86 mg/dL (ref 0.44–1.00)
GFR, Estimated: >60 mL/min (ref >60)
Glucose, Bld: 97 mg/dL (ref 70–99)
Potassium: 3.9 mmol/L (ref 3.5–5.1)
Sodium: 137 mmol/L (ref 135–145)
Total Bilirubin: 0.5 mg/dL (ref 0.3–1.2)
Total Protein: 7.2 g/dL (ref 6.5–8.1)

## 2022-04-15 LAB — LIPID PANEL
Cholesterol: 240 mg/dL — ABNORMAL HIGH (ref 0–200)
HDL: 60 mg/dL (ref >40)
LDL Cholesterol: 171 mg/dL — ABNORMAL HIGH (ref 0–99)
Total CHOL/HDL Ratio: 4 RATIO
Triglycerides: 47 mg/dL (ref <150)
VLDL: 9 mg/dL (ref 0–40)

## 2022-04-15 LAB — VITAMIN D 25 HYDROXY (VIT D DEFICIENCY, FRACTURES): Vit D, 25-Hydroxy: 29.44 ng/mL — ABNORMAL LOW (ref 30–100)

## 2022-04-15 LAB — T4, FREE: Free T4: 0.95 ng/dL (ref 0.61–1.12)

## 2022-04-15 LAB — TSH: TSH: 4.263 u[IU]/mL (ref 0.350–4.500)

## 2022-04-15 NOTE — Telephone Encounter (Signed)
-----   Message from Brendolyn Patty, MD sent at 04/15/2022  2:37 PM EDT ----- Baseline labs for isotretinoin start for HS.  Chol elevated at 240.  Pt is in 30 day wait window right now- will start at next visit.

## 2022-04-15 NOTE — Telephone Encounter (Signed)
Tried calling patient on mobile number and no answer/no vm.  Tried home number and it was her daughter Kimberly Rocha' number.  Advised daughter to have patient call for lab results.sh

## 2022-04-16 LAB — HEMOGLOBIN A1C
Hgb A1c MFr Bld: 5.5 % (ref 4.8–5.6)
Mean Plasma Glucose: 111 mg/dL

## 2022-04-23 LAB — QUANTIFERON-TB GOLD PLUS: QuantiFERON-TB Gold Plus: NEGATIVE

## 2022-04-23 LAB — QUANTIFERON-TB GOLD PLUS (RQFGPL)
QuantiFERON Mitogen Value: >10 IU/mL
QuantiFERON Nil Value: 0.05 IU/mL
QuantiFERON TB1 Ag Value: 0.03 IU/mL
QuantiFERON TB2 Ag Value: 0.04 IU/mL

## 2022-04-28 ENCOUNTER — Telehealth: Payer: Self-pay

## 2022-04-28 NOTE — Telephone Encounter (Signed)
-----   Message from Brendolyn Patty, MD sent at 04/28/2022 11:08 AM EDT ----- TB negative, continue Humira for HS as per note - please call patient

## 2022-04-28 NOTE — Telephone Encounter (Signed)
Tried calling pt about lab results, no answer and no vm.  I sent pt a MyChart message to please call for lab results./sh

## 2022-04-30 ENCOUNTER — Ambulatory Visit: Payer: Medicaid Other | Admitting: Nurse Practitioner

## 2022-04-30 DIAGNOSIS — E890 Postprocedural hypothyroidism: Secondary | ICD-10-CM

## 2022-05-05 ENCOUNTER — Ambulatory Visit: Payer: Medicaid Other | Admitting: Internal Medicine

## 2022-05-13 ENCOUNTER — Ambulatory Visit (INDEPENDENT_AMBULATORY_CARE_PROVIDER_SITE_OTHER): Payer: Medicaid Other | Admitting: Dermatology

## 2022-05-13 ENCOUNTER — Ambulatory Visit: Payer: Medicaid Other | Admitting: Nurse Practitioner

## 2022-05-13 VITALS — BP 127/85 | HR 94 | Wt 110.0 lb

## 2022-05-13 DIAGNOSIS — L700 Acne vulgaris: Secondary | ICD-10-CM

## 2022-05-13 DIAGNOSIS — L732 Hidradenitis suppurativa: Secondary | ICD-10-CM | POA: Diagnosis not present

## 2022-05-13 DIAGNOSIS — B370 Candidal stomatitis: Secondary | ICD-10-CM

## 2022-05-13 DIAGNOSIS — Z79899 Other long term (current) drug therapy: Secondary | ICD-10-CM | POA: Diagnosis not present

## 2022-05-13 DIAGNOSIS — E890 Postprocedural hypothyroidism: Secondary | ICD-10-CM

## 2022-05-13 MED ORDER — FLUCONAZOLE 200 MG PO TABS: 200.0000 mg | ORAL_TABLET | Freq: Every day | ORAL | 0 refills | Status: AC

## 2022-05-13 MED ORDER — NYSTATIN 100000 UNIT/ML MT SUSP: OROMUCOSAL | 2 refills | Status: AC

## 2022-05-13 NOTE — Patient Instructions (Addendum)
Isotretinoin Counseling; Review and Contraception Counseling: Reviewed potential side effects of isotretinoin including xerosis, cheilitis, hepatitis, hyperlipidemia, and severe birth defects if taken by a pregnant woman.  Women on isotretinoin must be celibate (not having sex) or required to use at least 2 birth control methods to prevent pregnancy (unless patient is a female of non-child bearing potential).  Females of child-bearing potential must have monthly pregnancy tests while on isotretinoin and report through I-Pledge (FDA monitoring program). Reviewed reports of suicidal ideation in those with a history of depression while taking isotretinoin and reports of diagnosis of inflammatory bowl disease (IBD) while taking isotretinoin as well as the lack of evidence for a causal relationship between isotretinoin, depression and IBD. Patient advised to reach out with any questions or concerns. Patient advised not to share pills or donate blood while on treatment or for one month after completing treatment. All patient's considering Isotretinoin must read and understand and sign Isotretinoin Consent Form and be registered with I-Pledge.   Due to recent changes in healthcare laws, you may see results of your pathology and/or laboratory studies on MyChart before the doctors have had a chance to review them. We understand that in some cases there may be results that are confusing or concerning to you. Please understand that not all results are received at the same time and often the doctors may need to interpret multiple results in order to provide you with the best plan of care or course of treatment. Therefore, we ask that you please give us 2 business days to thoroughly review all your results before contacting the office for clarification. Should we see a critical lab result, you will be contacted sooner.   If You Need Anything After Your Visit  If you have any questions or concerns for your doctor,  please call our main line at [phone] and press option 4 to reach your doctor's medical assistant. If no one answers, please leave a voicemail as directed and we will return your call as soon as possible. Messages left after 4 pm will be answered the following business day.   You may also send us a message via MyChart. We typically respond to MyChart messages within 1-2 business days.  For prescription refills, please ask your pharmacy to contact our office. Our fax number is [phone].  If you have an urgent issue when the clinic is closed that cannot wait until the next business day, you can page your doctor at the number below.    Please note that while we do our best to be available for urgent issues outside of office hours, we are not available 24/7.   If you have an urgent issue and are unable to reach us, you may choose to seek medical care at your doctor's office, retail clinic, urgent care center, or emergency room.  If you have a medical emergency, please immediately call 911 or go to the emergency department.  Pager Numbers  - Dr. Kowalski: [phone]  - Dr. Moye: [phone]  - Dr. Stewart: [phone]  In the event of inclement weather, please call our main line at [phone] for an update on the status of any delays or closures.  Dermatology Medication Tips: Please keep the boxes that topical medications come in in order to help keep track of the instructions about where and how to use these. Pharmacies typically print the medication instructions only on the boxes and not directly on the medication tubes.   If your medication is too expensive, please   contact our office at [phone] option 4 or send us a message through MyChart.   We are unable to tell what your co-pay for medications will be in advance as this is different depending on your insurance coverage. However, we may be able to find a substitute medication at lower cost or fill out paperwork to get  insurance to cover a needed medication.   If a prior authorization is required to get your medication covered by your insurance company, please allow us 1-2 business days to complete this process.  Drug prices often vary depending on where the prescription is filled and some pharmacies may offer cheaper prices.  The website www.goodrx.com contains coupons for medications through different pharmacies. The prices here do not account for what the cost may be with help from insurance (it may be cheaper with your insurance), but the website can give you the price if you did not use any insurance.  - You can print the associated coupon and take it with your prescription to the pharmacy.  - You may also stop by our office during regular business hours and pick up a GoodRx coupon card.  - If you need your prescription sent electronically to a different pharmacy, notify our office through [facility] MyChart or by phone at [phone] option 4.     Si Usted Necesita Algo Despus de Su Visita  Tambin puede enviarnos un mensaje a travs de MyChart. Por lo general respondemos a los mensajes de MyChart en el transcurso de 1 a 2 das hbiles.  Para renovar recetas, por favor pida a su farmacia que se ponga en contacto con nuestra oficina. Nuestro nmero de fax es el [phone].  Si tiene un asunto urgente cuando la clnica est cerrada y que no puede esperar hasta el siguiente da hbil, puede llamar/localizar a su doctor(a) al nmero que aparece a continuacin.   Por favor, tenga en cuenta que aunque hacemos todo lo posible para estar disponibles para asuntos urgentes fuera del horario de oficina, no estamos disponibles las 24 horas del da, los 7 das de la semana.   Si tiene un problema urgente y no puede comunicarse con nosotros, puede optar por buscar atencin mdica  en el consultorio de su doctor(a), en una clnica privada, en un centro de atencin urgente o en una sala de emergencias.  Si  tiene una emergencia mdica, por favor llame inmediatamente al 911 o vaya a la sala de emergencias.  Nmeros de bper  - Dr. Kowalski: [phone]  - Dra. Moye: [phone]  - Dra. Stewart: [phone]  En caso de inclemencias del tiempo, por favor llame a nuestra lnea principal al [phone] para una actualizacin sobre el estado de cualquier retraso o cierre.  Consejos para la medicacin en dermatologa: Por favor, guarde las cajas en las que vienen los medicamentos de uso tpico para ayudarle a seguir las instrucciones sobre dnde y cmo usarlos. Las farmacias generalmente imprimen las instrucciones del medicamento slo en las cajas y no directamente en los tubos del medicamento.   Si su medicamento es muy caro, por favor, pngase en contacto con nuestra oficina llamando al [phone] y presione la opcin 4 o envenos un mensaje a travs de MyChart.   No podemos decirle cul ser su copago por los medicamentos por adelantado ya que esto es diferente dependiendo de la cobertura de su seguro. Sin embargo, es posible que podamos encontrar un medicamento sustituto a menor costo o llenar un formulario para que el seguro cubra el   medicamento que se considera necesario.   Si se requiere una autorizacin previa para que su compaa de seguros cubra su medicamento, por favor permtanos de 1 a 2 das hbiles para completar este proceso.  Los precios de los medicamentos varan con frecuencia dependiendo del lugar de dnde se surte la receta y alguna farmacias pueden ofrecer precios ms baratos.  El sitio web www.goodrx.com tiene cupones para medicamentos de diferentes farmacias. Los precios aqu no tienen en cuenta lo que podra costar con la ayuda del seguro (puede ser ms barato con su seguro), pero el sitio web puede darle el precio si no utiliz ningn seguro.  - Puede imprimir el cupn correspondiente y llevarlo con su receta a la farmacia.  - Tambin puede pasar por nuestra oficina  durante el horario de atencin regular y recoger una tarjeta de cupones de GoodRx.  - Si necesita que su receta se enve electrnicamente a una farmacia diferente, informe a nuestra oficina a travs de MyChart de [facility] o por telfono llamando al [phone] y presione la opcin 4.  

## 2022-05-13 NOTE — Progress Notes (Signed)
Follow-Up Visit   Subjective  Kimberly Rocha is a 47 y.o. female who presents for the following: Isotretinoin start for Hidradenitis Suppurative of the groin, axilla, inframammary. She started Spironolactone at last visit, but she was having some side effects in the mouth and wasn't sure if it was coming from that. She is on Humira injections q 14 days. Patient using Clindamycin gel and Opzelura cream prn flares.  She continue to have flares.   The following portions of the chart were reviewed this encounter and updated as appropriate: medications, allergies, medical history  Review of Systems:  No other skin or systemic complaints except as noted in HPI or Assessment and Plan.  Objective  Well appearing patient in no apparent distress; mood and affect are within normal limits.  An examination of the face was performed and relevant findings are noted below.   buccal mucosa Friable white macules of the buccal mucosa.    Assessment & Plan   Thrush buccal mucosa  Likely secondary to Humira injections, doubt it is from spironolactone.   Start Nystatin Suspension: TAKE 5 MLS (500,000 UNITS TOTAL) BY MOUTH AS DIRECTED. SWISH 5 ML AS LONG AS POSSIBLE AND SPIT OUT 3-4 TIMES DAILY   Start Fluconazole  take 1 po QD x 7 days dsp #7 0Rf  fluconazole (DIFLUCAN) 200 MG tablet - buccal mucosa Take 1 tablet (200 mg total) by mouth daily for 7 days.  nystatin (MYCOSTATIN) 100000 UNIT/ML suspension - buccal mucosa TAKE 5 MLS (500,000 UNITS TOTAL) BY MOUTH AS DIRECTED. SWISH 5 ML AS LONG AS POSSIBLE AND SPIT OUT 3-4 TIMES DAILY  Acne vulgaris  Related Procedures Pregnancy, urine  HIDRADENITIS SUPPURATIVA Exam: Patient not examined today.   Chronic and persistent condition with duration or expected duration over one year. Condition is symptomatic/ bothersome to patient. Not currently at goal. Currently on Humira injections, and here to start isotretinoin.   Hidradenitis Suppurativa  is a chronic; persistent; non-curable, but treatable condition due to abnormal inflamed sweat glands in the body folds (axilla, inframammary, groin, medial thighs), causing recurrent painful draining cysts and scarring. It can be associated with severe scarring acne and cysts; also abscesses and scarring of scalp. The goal is control and prevention of flares, as it is not curable. Scars are permanent and can be thickened. Treatment may include daily use of topical medication and oral antibiotics.  Oral isotretinoin may also be helpful.  For some cases, Humira or Cosentyx (biologic injections) may be prescribed to decrease the inflammatory process and prevent flares.  When indicated, inflamed cysts may also be treated surgically.  iPLEDGE # [id number] Birth Control- Tubal Ligation, Female Latex Condoms  Treatment Plan: Continue Humira injection 80 mg 2 wks Continue Clindamycin gel qd  Continue Opzelura bid for itching Restart Spironolactone 100 MG take 1/2 tablet by mouth daily Lab for urine pregnancy test ordered. Patient will have done on Monday 05/18/22.  (Still within the 30 day window so can't start today) If labs negative, will start Absorica 20 MG take 1 po QD w/food dsp #30 0Rf. Also discussed changing Humira to Cosentyx if not improving  Isotretinoin Counseling; Review and Contraception Counseling: Reviewed potential side effects of isotretinoin including xerosis, cheilitis, hepatitis, hyperlipidemia, and severe birth defects if taken by a pregnant woman.  Women on isotretinoin must be celibate (not having sex) or required to use at least 2 birth control methods to prevent pregnancy (unless patient is a female of non-child bearing potential).  Females of child-bearing  potential must have monthly pregnancy tests while on isotretinoin and report through I-Pledge (FDA monitoring program). Reviewed reports of suicidal ideation in those with a history of depression while taking isotretinoin and  reports of diagnosis of inflammatory bowl disease (IBD) while taking isotretinoin as well as the lack of evidence for a causal relationship between isotretinoin, depression and IBD. Patient advised to reach out with any questions or concerns. Patient advised not to share pills or donate blood while on treatment or for one month after completing treatment. All patient's considering Isotretinoin must read and understand and sign Isotretinoin Consent Form and be registered with I-Pledge.   Long term medication management (isotretinoin) - While taking Isotretinoin and for 30 days after you finish the medication, do not get pregnant, do not share pills, do not donate blood. Isotretinoin is best absorbed when taken with a fatty meal. Isotretinoin can make you sensitive to the sun. Daily careful sun protection including sunscreen SPF 30+ when outdoors is recommended.  Follow-up in 35 days.  ICherlyn Labella, CMA, am acting as scribe for Willeen Niece, MD .   Documentation: I have reviewed the above documentation for accuracy and completeness, and I agree with the above.  Willeen Niece, MD

## 2022-05-18 ENCOUNTER — Ambulatory Visit: Payer: Medicaid Other | Admitting: Dermatology

## 2022-05-18 ENCOUNTER — Other Ambulatory Visit (HOSPITAL_COMMUNITY)
Admission: RE | Admit: 2022-05-18 | Discharge: 2022-05-18 | Disposition: A | Discharge status: Home / Self Care | Payer: Medicaid Other | Source: Ambulatory Visit | Attending: Dermatology | Admitting: Dermatology

## 2022-05-18 ENCOUNTER — Telehealth: Payer: Self-pay

## 2022-05-18 ENCOUNTER — Ambulatory Visit: Payer: Medicaid Other | Admitting: Internal Medicine

## 2022-05-18 DIAGNOSIS — L700 Acne vulgaris: Secondary | ICD-10-CM | POA: Diagnosis not present

## 2022-05-18 LAB — PREGNANCY, URINE: Preg Test, Ur: NEGATIVE

## 2022-05-18 MED ORDER — ISOTRETINOIN 20 MG PO CAPS: 20.0000 mg | ORAL_CAPSULE | Freq: Every day | ORAL | 0 refills | Status: DC

## 2022-05-18 NOTE — Telephone Encounter (Signed)
-----   Message from Willeen Niece, MD sent at 05/18/2022  4:49 PM EDT ----- Pregnancy test negative, send in Absorica 20 mg as per note - please call patient

## 2022-05-18 NOTE — Telephone Encounter (Signed)
Advised pt of lab results.   Pt confirmed in [facility] program and advised for her to demonstrate comprehension.  Absorica   1 po qd with food #30 0rf sent to [employer].

## 2022-05-19 ENCOUNTER — Encounter: Payer: Self-pay | Admitting: Dermatology

## 2022-05-19 ENCOUNTER — Other Ambulatory Visit: Payer: Self-pay

## 2022-05-19 MED ORDER — ISOTRETINOIN 20 MG PO CAPS: 20.0000 mg | ORAL_CAPSULE | Freq: Every day | ORAL | 0 refills | Status: DC

## 2022-05-22 ENCOUNTER — Ambulatory Visit: Payer: Medicaid Other | Admitting: Internal Medicine

## 2022-05-25 ENCOUNTER — Ambulatory Visit (INDEPENDENT_AMBULATORY_CARE_PROVIDER_SITE_OTHER): Payer: Medicaid Other | Admitting: Internal Medicine

## 2022-05-25 VITALS — BP 116/77 | HR 97 | Ht 60.0 in | Wt 114.6 lb

## 2022-05-25 DIAGNOSIS — Z1231 Encounter for screening mammogram for malignant neoplasm of breast: Secondary | ICD-10-CM

## 2022-05-25 DIAGNOSIS — Z1211 Encounter for screening for malignant neoplasm of colon: Secondary | ICD-10-CM

## 2022-05-25 DIAGNOSIS — F418 Other specified anxiety disorders: Secondary | ICD-10-CM

## 2022-05-25 DIAGNOSIS — E559 Vitamin D deficiency, unspecified: Secondary | ICD-10-CM | POA: Diagnosis not present

## 2022-05-25 DIAGNOSIS — Z1239 Encounter for other screening for malignant neoplasm of breast: Secondary | ICD-10-CM | POA: Insufficient documentation

## 2022-05-25 DIAGNOSIS — F410 Panic disorder [episodic paroxysmal anxiety] without agoraphobia: Secondary | ICD-10-CM

## 2022-05-25 MED ORDER — FLUOXETINE HCL 20 MG PO TABS: 30.0000 mg | ORAL_TABLET | Freq: Every day | ORAL | 3 refills | Status: DC

## 2022-05-25 MED ORDER — MIRTAZAPINE 15 MG PO TBDP: 15.0000 mg | ORAL_TABLET | Freq: Every day | ORAL | 2 refills | Status: DC

## 2022-05-25 NOTE — Assessment & Plan Note (Signed)
Noted on recent labs.  Daily vitamin D supplementation has been recommended.

## 2022-05-25 NOTE — Progress Notes (Signed)
Acute Office Visit  Subjective:     Patient ID: Kimberly Rocha, female    DOB: 12/03/1975, 47 y.o.   MRN: [id number]  Chief Complaint  Patient presents with   preventive care    Wants to discuss colonoscopy and mammo.    Kimberly Rocha presents today for an acute visit to discuss outstanding cancer screenings and anxiety.  She was last seen at [facility] by Dr. Allena Katz on 01/14/22.  Kimberly Rocha currently feels that her anxiety is poorly controlled.  She is currently prescribed fluoxetine 20 mg daily and has previously been prescribed Xanax and hydroxyzine.  She states that hydroxyzine is ineffective and is interested in other medication options.  She is also interested in resuming Remeron, which she was previously prescribed for anxiety.  She states that this was beneficial for her appetite as well.  Review of Systems  Psychiatric/Behavioral:  The patient is nervous/anxious.       Objective:    BP 116/77   Pulse 97   Ht 5' (1.524 m)   Wt 114 lb 9.6 oz (52 kg)   SpO2 98%   BMI 22.38 kg/m   Physical Exam Vitals reviewed.  Constitutional:      General: She is not in acute distress.    Appearance: Normal appearance. She is not toxic-appearing.     Comments: Strong odor of marijuana  HENT:     Head: Normocephalic and atraumatic.     Right Ear: External ear normal.     Left Ear: External ear normal.     Nose: Nose normal. No congestion or rhinorrhea.     Mouth/Throat:     Mouth: Mucous membranes are moist.     Pharynx: Oropharynx is clear. No oropharyngeal exudate or posterior oropharyngeal erythema.  Eyes:     General: No scleral icterus.    Extraocular Movements: Extraocular movements intact.     Conjunctiva/sclera: Conjunctivae normal.     Pupils: Pupils are equal, round, and reactive to light.  Cardiovascular:     Rate and Rhythm: Normal rate and regular rhythm.     Pulses: Normal pulses.     Heart sounds: Normal heart sounds. No murmur heard.    No friction rub. No gallop.   Pulmonary:     Effort: Pulmonary effort is normal.     Breath sounds: Normal breath sounds. No wheezing, rhonchi or rales.  Abdominal:     General: Abdomen is flat. Bowel sounds are normal. There is no distension.     Palpations: Abdomen is soft.     Tenderness: There is no abdominal tenderness.  Musculoskeletal:        General: No swelling. Normal range of motion.     Cervical back: Normal range of motion.     Right lower leg: No edema.     Left lower leg: No edema.  Lymphadenopathy:     Cervical: No cervical adenopathy.  Skin:    General: Skin is warm and dry.     Capillary Refill: Capillary refill takes less than 2 seconds.     Coloration: Skin is not jaundiced.  Neurological:     General: No focal deficit present.     Mental Status: She is alert and oriented to person, place, and time.  Psychiatric:        Mood and Affect: Mood normal.        Behavior: Behavior normal.       Assessment & Plan:   Problem List Items Addressed This Visit  Anxiety with depression    Poor controlled.  She is currently prescribed Prozac 20 mg daily and hydroxyzine for as needed use.  She states that hydroxyzine is ineffective.  She is interested in additional treatment options today and has also requested to resume Remeron, which she was previously prescribed. -Increase Prozac to 30 mg daily.  Can increase to 40 mg daily after 1 week if needed. -Resume Remeron at 15 mg daily -Follow-up in 3 months for reassessment      Vitamin D insufficiency    Noted on recent labs.  Daily vitamin D supplementation has been recommended.      Colon cancer screening    Previously referred to gastroenterology for screening colonoscopy.  Has not completed the questionnaire that was mailed to her home. -Questionnaire printed and provided to the patient today.  She was instructed to return to the gastroenterology office upon completion.      Breast cancer screening    Screening mammogram previously  ordered but not scheduled. -Will assist the patient with scheduling her mammogram today      Meds ordered this encounter  Medications   FLUoxetine (PROZAC) 20 MG tablet    Sig: Take 1.5 tablets (30 mg total) by mouth daily.    Dispense:  90 tablet    Refill:  3   mirtazapine (REMERON SOL-TAB) 15 MG disintegrating tablet    Sig: Take 1 tablet (15 mg total) by mouth at bedtime.    Dispense:  30 tablet    Refill:  2    Return in about 3 months (around 08/24/2022).  Billie Lade, MD

## 2022-05-25 NOTE — Assessment & Plan Note (Signed)
Previously referred to gastroenterology for screening colonoscopy.  Has not completed the questionnaire that was mailed to her home. -Questionnaire printed and provided to the patient today.  She was instructed to return to the gastroenterology office upon completion.

## 2022-05-25 NOTE — Assessment & Plan Note (Signed)
Poor controlled.  She is currently prescribed Prozac 20 mg daily and hydroxyzine for as needed use.  She states that hydroxyzine is ineffective.  She is interested in additional treatment options today and has also requested to resume Remeron, which she was previously prescribed. -Increase Prozac to 30 mg daily.  Can increase to 40 mg daily after 1 week if needed. -Resume Remeron at 15 mg daily -Follow-up in 3 months for reassessment

## 2022-05-25 NOTE — Assessment & Plan Note (Signed)
Screening mammogram previously ordered but not scheduled. -Will assist the patient with scheduling her mammogram today

## 2022-05-25 NOTE — Patient Instructions (Signed)
It was a pleasure to see you today.  Thank you for giving [place] the opportunity to be involved in your care.  Below is a brief recap of your visit and next steps.  We will plan to see you again in 3 months.  Summary Increase Prozac to 30 mg daily Recommend starting vitamin D supplementation 2000 IU daily Will follow up in mammogram and colonoscopy orders Follow up in 3 months

## 2022-05-27 ENCOUNTER — Encounter: Payer: Self-pay | Admitting: Internal Medicine

## 2022-06-01 ENCOUNTER — Ambulatory Visit: Payer: Medicaid Other | Admitting: Nurse Practitioner

## 2022-06-02 ENCOUNTER — Ambulatory Visit: Payer: 59 | Admitting: Dermatology

## 2022-06-15 ENCOUNTER — Other Ambulatory Visit: Payer: Self-pay | Admitting: Dermatology

## 2022-06-15 DIAGNOSIS — L732 Hidradenitis suppurativa: Secondary | ICD-10-CM

## 2022-06-17 ENCOUNTER — Encounter: Payer: Self-pay | Admitting: Dermatology

## 2022-06-17 ENCOUNTER — Ambulatory Visit: Payer: Medicaid Other | Admitting: Dermatology

## 2022-06-24 ENCOUNTER — Ambulatory Visit: Payer: Medicaid Other | Admitting: Dermatology

## 2022-06-27 LAB — T4, FREE: Free T4: 0.78 ng/dL — ABNORMAL LOW (ref 0.82–1.77)

## 2022-06-27 LAB — TSH: TSH: 8.74 u[IU]/mL — ABNORMAL HIGH (ref 0.450–4.500)

## 2022-07-01 ENCOUNTER — Ambulatory Visit (INDEPENDENT_AMBULATORY_CARE_PROVIDER_SITE_OTHER): Payer: Medicaid Other | Admitting: Dermatology

## 2022-07-01 VITALS — BP 123/85 | HR 81 | Wt 120.8 lb

## 2022-07-01 DIAGNOSIS — K130 Diseases of lips: Secondary | ICD-10-CM | POA: Diagnosis not present

## 2022-07-01 DIAGNOSIS — L732 Hidradenitis suppurativa: Secondary | ICD-10-CM

## 2022-07-01 DIAGNOSIS — L720 Epidermal cyst: Secondary | ICD-10-CM

## 2022-07-01 DIAGNOSIS — L729 Follicular cyst of the skin and subcutaneous tissue, unspecified: Secondary | ICD-10-CM

## 2022-07-01 DIAGNOSIS — Z79899 Other long term (current) drug therapy: Secondary | ICD-10-CM

## 2022-07-01 DIAGNOSIS — L659 Nonscarring hair loss, unspecified: Secondary | ICD-10-CM | POA: Diagnosis not present

## 2022-07-01 DIAGNOSIS — L853 Xerosis cutis: Secondary | ICD-10-CM

## 2022-07-01 MED ORDER — SPIRONOLACTONE 100 MG PO TABS: ORAL_TABLET | ORAL | 3 refills | Status: DC

## 2022-07-01 MED ORDER — ISOTRETINOIN 30 MG PO CAPS: 30.0000 mg | ORAL_CAPSULE | Freq: Every day | ORAL | 0 refills | Status: DC

## 2022-07-01 MED ORDER — OPZELURA 1.5 % EX CREA: TOPICAL_CREAM | CUTANEOUS | 6 refills | Status: DC

## 2022-07-01 MED ORDER — CLINDAMYCIN PHOSPHATE 1 % EX GEL: Freq: Every day | CUTANEOUS | 4 refills | Status: DC

## 2022-07-01 NOTE — Patient Instructions (Addendum)
Your prescription was sent to [facility] in [place]. A representative from [facility] will contact you within 3 business hours to verify your address and insurance information to schedule a free delivery. If for any reason you do not receive a phone call from them, please reach out to them. Their phone number is [phone] and their hours are Monday-Friday 9:00 am-5:00 pm.    Isotretinoin Counseling; Review and Contraception Counseling: Reviewed potential side effects of isotretinoin including xerosis, cheilitis, hepatitis, hyperlipidemia, and severe birth defects if taken by a pregnant woman.  Women on isotretinoin must be celibate (not having sex) or required to use at least 2 birth control methods to prevent pregnancy (unless patient is a female of non-child bearing potential).  Females of child-bearing potential must have monthly pregnancy tests while on isotretinoin and report through I-Pledge (FDA monitoring program). Reviewed reports of suicidal ideation in those with a history of depression while taking isotretinoin and reports of diagnosis of inflammatory bowl disease (IBD) while taking isotretinoin as well as the lack of evidence for a causal relationship between isotretinoin, depression and IBD. Patient advised to reach out with any questions or concerns. Patient advised not to share pills or donate blood while on treatment or for one month after completing treatment. All patient's considering Isotretinoin must read and understand and sign Isotretinoin Consent Form and be registered with I-Pledge.   Spironolactone - if no side effects, may increase to 1 full pill daily.  Spironolactone can cause increased urination and cause blood pressure to decrease. Please watch for signs of lightheadedness and be cautious when changing position. It can sometimes cause breast tenderness or an irregular period in premenopausal women. It can also increase potassium. The increase in potassium  usually is not a concern unless you are taking other medicines that also increase potassium, so please be sure your doctor knows all of the other medications you are taking. This medication should not be taken by pregnant women.  This medicine should also not be taken together with sulfa drugs like Bactrim (trimethoprim/sulfamethexazole).    Due to recent changes in healthcare laws, you may see results of your pathology and/or laboratory studies on MyChart before the doctors have had a chance to review them. We understand that in some cases there may be results that are confusing or concerning to you. Please understand that not all results are received at the same time and often the doctors may need to interpret multiple results in order to provide you with the best plan of care or course of treatment. Therefore, we ask that you please give [place] 2 business days to thoroughly review all your results before contacting the office for clarification. Should we see a critical lab result, you will be contacted sooner.   If You Need Anything After Your Visit  If you have any questions or concerns for your doctor, please call our main line at [phone] and press option 4 to reach your doctor's medical assistant. If no one answers, please leave a voicemail as directed and we will return your call as soon as possible. Messages left after 4 pm will be answered the following business day.   You may also send [place] a message via MyChart. We typically respond to MyChart messages within 1-2 business days.  For prescription refills, please ask your pharmacy to contact our office. Our fax number is [phone].  If you have an urgent issue when the clinic is closed that cannot wait until the next business day,  you can page your doctor at the number below.    Please note that while we do our best to be available for urgent issues outside of office hours, we are not available 24/7.   If you have an urgent issue and are  unable to reach [place], you may choose to seek medical care at your doctor's office, retail clinic, urgent care center, or emergency room.  If you have a medical emergency, please immediately call 911 or go to the emergency department.  Pager Numbers  - Dr. Gwen Pounds: [phone]  - Dr. Neale Burly: [phone]  - Dr. Roseanne Reno: [phone]  In the event of inclement weather, please call our main line at [phone] for an update on the status of any delays or closures.  Dermatology Medication Tips: Please keep the boxes that topical medications come in in order to help keep track of the instructions about where and how to use these. Pharmacies typically print the medication instructions only on the boxes and not directly on the medication tubes.   If your medication is too expensive, please contact our office at [phone] option 4 or send [place] a message through MyChart.   We are unable to tell what your co-pay for medications will be in advance as this is different depending on your insurance coverage. However, we may be able to find a substitute medication at lower cost or fill out paperwork to get insurance to cover a needed medication.   If a prior authorization is required to get your medication covered by your insurance company, please allow [place] 1-2 business days to complete this process.  Drug prices often vary depending on where the prescription is filled and some pharmacies may offer cheaper prices.  The website www.goodrx.com contains coupons for medications through different pharmacies. The prices here do not account for what the cost may be with help from insurance (it may be cheaper with your insurance), but the website can give you the price if you did not use any insurance.  - You can print the associated coupon and take it with your prescription to the pharmacy.  - You may also stop by our office during regular business hours and pick up a GoodRx coupon card.  - If you need your  prescription sent electronically to a different pharmacy, notify our office through [facility] or by phone at [phone] option 4.     Si Usted Necesita Algo Despus de Su Visita  Tambin puede enviarnos un mensaje a travs de Clinical cytogeneticist. Por lo general respondemos a los mensajes de MyChart en el transcurso de 1 a 2 das hbiles.  Para renovar recetas, por favor pida a su farmacia que se ponga en contacto con nuestra oficina. Annie Sable de fax es [place] [phone].  Si tiene un asunto urgente cuando la clnica est cerrada y que no puede esperar hasta el siguiente da hbil, puede llamar/localizar a su doctor(a) al nmero que aparece a continuacin.   Por favor, tenga en cuenta que aunque hacemos todo lo posible para estar disponibles para asuntos urgentes fuera del horario de [place], no estamos disponibles las 24 horas del da, los 7 [address]  [address] de la [place].   Si tiene un problema urgente y no puede comunicarse con nosotros, puede optar por buscar atencin mdica  en el consultorio de su doctor(a), en una clnica privada, en un centro de atencin urgente o en una sala de emergencias.  Si tiene una emergencia mdica, por favor llame inmediatamente al 911 o vaya a  la sala de emergencias.  Nmeros de bper  - Dr. Gwen Pounds: [phone]  - Dra. Moye: [phone]  - Dra. Roseanne Reno: [phone]  En caso de inclemencias del [place], por favor llame a Lacy Duverney principal al [phone] para una actualizacin sobre el [place] de cualquier retraso o cierre.  Consejos para la medicacin en dermatologa: Por favor, guarde las cajas en las que vienen los medicamentos de uso tpico para ayudarle a seguir las instrucciones sobre dnde y cmo usarlos. Las farmacias generalmente imprimen las instrucciones del medicamento slo en las cajas y no directamente en los tubos del [place].   Si su medicamento es muy caro, por favor, pngase en contacto con Rolm Gala llamando al [phone]  y presione la opcin 4 o envenos un mensaje a travs de Clinical cytogeneticist.   No podemos decirle cul ser su copago por los medicamentos por adelantado ya que esto es diferente dependiendo de la cobertura de su seguro. Sin embargo, es posible que podamos encontrar un medicamento sustituto a Audiological scientist un formulario para que el seguro cubra el medicamento que se considera necesario.   Si se requiere una autorizacin previa para que su compaa de seguros [place] su medicamento, por favor permtanos de 1 a 2 das hbiles para completar [address]  Los precios de los medicamentos varan con frecuencia dependiendo del Environmental consultant de dnde se surte la receta y alguna farmacias pueden ofrecer precios ms baratos.  El sitio web www.goodrx.com tiene cupones para medicamentos de Health and safety inspector. Los precios aqu no tienen en cuenta lo que podra costar con la ayuda del seguro (puede ser ms barato con su seguro), pero el sitio web puede darle el precio si no utiliz Tourist information centre manager.  - Puede imprimir el cupn correspondiente y llevarlo con su receta a la farmacia.  - Tambin puede pasar por nuestra oficina durante el horario de atencin regular y Education officer, museum una tarjeta de cupones de GoodRx.  - Si necesita que su receta se enve electrnicamente a una farmacia diferente, informe a nuestra oficina a travs de MyChart de [place] o por telfono llamando al [phone] y presione la opcin 4.

## 2022-07-01 NOTE — Progress Notes (Signed)
Isotretinoin Follow-Up Visit   Subjective  Kimberly Rocha is a 47 y.o. 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 She is improving with Isotretinoin 20 MG daily, Humira injections (started in 06/2021)- no side effects, Clindamycin Gel, Opzelura Cream, and Spironolactone 100 MG 1/2 tablet daily, no side effects. She does have one bump in the groin area that has been there for 1 year. Patient's mood has improved since last visit.  She didn't flare as bad this past month.  Week # 4 Pharmacy [facility] #[id number] Total mg -  600 mg Total mg/kg - 10.94 mg/kg    Isotretinoin F/U - 07/01/22 1200       Isotretinoin Follow Up   iPledge # [id number]    Date 07/01/22    Weight 120 lb (54.4 kg)    Two Forms of Birth Control Tubal Sterilization;Female Condom    Acne breakouts since last visit? Yes      Dosage   Target Dosage (mg) 10960    Current (To Date) Dosage (mg) 600    To Go Dosage (mg) 10360      Side Effects   Skin Dry Lips    Gastrointestinal WNL    Neurological WNL    Constitutional WNL               Side effects: Dry skin, dry lips  The following portions of the chart were reviewed this encounter and updated as appropriate: medications, allergies, medical history  Review of Systems:  No other skin or systemic complaints except as noted in HPI or Assessment and Plan.  Objective  Well appearing patient in no apparent distress; mood and affect are within normal limits.  An examination of the face, neck, chest, and back was performed and relevant findings are noted below.     Assessment & Plan   Hidradenitis suppurativa  Related Medications clindamycin (CLINDAGEL) 1 % gel Apply topically daily. Qd to aa rash groin, under breast  spironolactone (ALDACTONE) 100 MG tablet TAKE 1 TABLET BY MOUTH DAILY  Alopecia  Related Medications Clobetasol Prop Emollient Base  (CLOBETASOL PROPIONATE E) 0.05 % emollient cream Apply to affected area qd  Ruxolitinib Phosphate (OPZELURA) 1.5 % CREA Apply to affected area qd  EPIDERMAL INCLUSION CYST Exam: Hyperpigmented firm subcutaneous nodule at perineal/groin, 2.0 x 1.0 CM  Benign-appearing. Exam most consistent with an epidermal inclusion cyst. Discussed that a cyst is a benign growth that can grow over time and sometimes get irritated or inflamed. Recommend observation if it is not bothersome. Discussed option of surgical excision to remove it if it is growing, symptomatic, or other changes noted. Please call for new or changing lesions so they can be evaluated. Patient would like removed. If still bothersome after she has completed isotretinoin course, will send referral to general surgeon.  HIDRADENITIS SUPPURATIVA  Patient is currently on Isotretinoin requiring FDA mandated monthly evaluations and laboratory monitoring. Condition is currently not to goal (must reach target dose based on weight and also have clear skin for 2 months prior to discontinuation in order to help prevent relapse) Chronic and persistent condition with duration or expected duration over one year. Condition is symptomatic / bothersome to patient. Not to goal.  Hidradenitis Suppurativa is a chronic; persistent; non-curable, but treatable condition due to abnormal inflamed sweat glands in the body folds (axilla, inframammary, groin, medial thighs), causing recurrent painful draining cysts and scarring. It can be associated with severe scarring  acne and cysts; also abscesses and scarring of scalp. The goal is control and prevention of flares, as it is not curable. Scars are permanent and can be thickened. Treatment may include daily use of topical medication and oral antibiotics.  Oral isotretinoin may also be helpful.  For some cases, Humira or Cosentyx (biologic injections) may be prescribed to decrease the inflammatory process and prevent  flares.  When indicated, inflamed cysts may also be treated surgically.  Exam findings:   Continue Humira injection 80 mg q2 wks Continue Clindamycin gel qd  Continue Opzelura bid for itching Continue Spironolactone 100 MG take 1/2 tablet by mouth daily. If patient tolerating ok, may increase to 1 full tablet daily.  Increase to Absorica 30 MG take 1 po QD with food dsp #30 0Rf  Patient confirmed in iPledge and isotretinoin sent to pharmacy.   Urine pregnancy test in-office today was negative.   Xerosis secondary to isotretinoin therapy - Continue emollients as directed - Xyzal (levocetirizine) once a day and fish oil 1 gram daily may also help with dryness   Cheilitis secondary to isotretinoin therapy - Continue lip balm as directed, Dr. Clayborne Artist Cortibalm recommended   Long term medication management (isotretinoin) - While taking Isotretinoin and for 30 days after you finish the medication, do not share pills, do not donate blood. It is very important that a women who could become pregnant not take this medicine or get a blood transfusion with this medicine in it. Isotretinoin is best absorbed when taken with a fatty meal. Isotretinoin can make you sensitive to the sun. Daily careful sun protection including sunscreen SPF 30+ when outdoors is recommended.  Follow-up in 30 days.  ICherlyn Labella, CMA, am acting as scribe for Willeen Niece, MD .   Documentation: I have reviewed the above documentation for accuracy and completeness, and I agree with the above.  Willeen Niece, MD

## 2022-07-03 ENCOUNTER — Encounter: Payer: Self-pay | Admitting: Dermatology

## 2022-07-08 ENCOUNTER — Ambulatory Visit: Payer: Medicaid Other | Admitting: Nurse Practitioner

## 2022-07-08 DIAGNOSIS — E890 Postprocedural hypothyroidism: Secondary | ICD-10-CM

## 2022-08-03 ENCOUNTER — Ambulatory Visit: Payer: Medicaid Other | Admitting: Dermatology

## 2022-08-17 ENCOUNTER — Other Ambulatory Visit: Payer: Self-pay | Admitting: Nurse Practitioner

## 2022-08-17 DIAGNOSIS — E890 Postprocedural hypothyroidism: Secondary | ICD-10-CM

## 2022-08-18 ENCOUNTER — Ambulatory Visit: Payer: Medicaid Other | Admitting: Nurse Practitioner

## 2022-08-18 ENCOUNTER — Encounter: Payer: Self-pay | Admitting: Nurse Practitioner

## 2022-08-18 DIAGNOSIS — E890 Postprocedural hypothyroidism: Secondary | ICD-10-CM

## 2022-08-18 NOTE — Telephone Encounter (Signed)
See pt message

## 2022-08-18 NOTE — Telephone Encounter (Signed)
Can you check on this?  I dont see any labs, do you?

## 2022-08-19 ENCOUNTER — Other Ambulatory Visit: Payer: Self-pay | Admitting: Nurse Practitioner

## 2022-08-19 ENCOUNTER — Encounter: Payer: Self-pay | Admitting: Nurse Practitioner

## 2022-08-19 DIAGNOSIS — E890 Postprocedural hypothyroidism: Secondary | ICD-10-CM

## 2022-08-19 LAB — T4, FREE: Free T4: 1.32 ng/dL (ref 0.82–1.77)

## 2022-08-19 LAB — TSH: TSH: 3.67 u[IU]/mL (ref 0.450–4.500)

## 2022-08-19 MED ORDER — LEVOTHYROXINE SODIUM 137 MCG PO TABS: 62.5000 ug | ORAL_TABLET | Freq: Every day | ORAL | 1 refills | Status: DC

## 2022-08-24 ENCOUNTER — Ambulatory Visit: Payer: Medicaid Other | Admitting: Internal Medicine

## 2022-09-23 ENCOUNTER — Encounter: Payer: Self-pay | Admitting: Internal Medicine

## 2022-09-23 ENCOUNTER — Other Ambulatory Visit: Payer: Self-pay | Admitting: Dermatology

## 2022-09-25 ENCOUNTER — Other Ambulatory Visit: Payer: Self-pay

## 2022-09-25 DIAGNOSIS — F410 Panic disorder [episodic paroxysmal anxiety] without agoraphobia: Secondary | ICD-10-CM

## 2022-09-25 DIAGNOSIS — F418 Other specified anxiety disorders: Secondary | ICD-10-CM

## 2022-09-30 ENCOUNTER — Other Ambulatory Visit: Payer: Self-pay

## 2022-09-30 DIAGNOSIS — F418 Other specified anxiety disorders: Secondary | ICD-10-CM

## 2022-09-30 DIAGNOSIS — F410 Panic disorder [episodic paroxysmal anxiety] without agoraphobia: Secondary | ICD-10-CM

## 2022-09-30 MED ORDER — FLUOXETINE HCL 20 MG PO TABS: 30.0000 mg | ORAL_TABLET | Freq: Every day | ORAL | 3 refills | Status: DC

## 2022-10-01 ENCOUNTER — Ambulatory Visit: Payer: Medicaid Other | Admitting: Nurse Practitioner

## 2022-10-07 ENCOUNTER — Ambulatory Visit: Payer: Medicaid Other | Admitting: Dermatology

## 2022-10-09 ENCOUNTER — Telehealth: Payer: Self-pay | Admitting: Internal Medicine

## 2022-10-09 NOTE — Telephone Encounter (Signed)
Spoke with daughter and printed letter.

## 2022-10-09 NOTE — Telephone Encounter (Signed)
Patient daughter Duanne Moron calling says pt is in jail and is needing a note to be able to have her medications filled- says she is going through withdrawals without them. Please advise daughter when note is available to pick up. Thank you

## 2022-11-24 ENCOUNTER — Telehealth: Payer: Self-pay | Admitting: Internal Medicine

## 2022-11-24 ENCOUNTER — Telehealth: Payer: Self-pay | Admitting: Nurse Practitioner

## 2022-11-24 NOTE — Telephone Encounter (Signed)
Pts daughter came by and stated that mom was incarcerated and was having some issues that she believed she needed to be admitted to the er for.  Pts daughter Morrie Sheldon emailed screen shots from her mom texting from a tablet in jail.  Pts daughter asked for call back to advise on next steps

## 2022-11-24 NOTE — Telephone Encounter (Signed)
Daughter Morrie Sheldon called in on patient behalf.  Patient is currently in [facility] and is in  need of medications. Patient has not received meds due to facility stating that medications cost is too high.  Patient is feeling sick, throat swelling and as if she is having a stroke  Feels like blood clots are forming in feet  L side pain   Wants to see if provider can call facility to have them administer patient medication and send patient to hospital for evaluations .  Daughter would like a call back.

## 2022-11-24 NOTE — Telephone Encounter (Signed)
Daughter Morrie Sheldon updated phone [phone]

## 2022-11-24 NOTE — Telephone Encounter (Signed)
Spoke with Morrie Sheldon (pts daughter). Explained to her that she would need to contact her PCP with any concerns at this time. The pt was supposed to follow up Feb 2024 but no showed. Last appt was Dec 2023. Daughter agrees to contact PCP.

## 2022-11-24 NOTE — Telephone Encounter (Signed)
Number not in service.

## 2022-11-25 NOTE — Telephone Encounter (Signed)
Spoke with pt daughter states medical staff is refusing to give pt her medications due to them being expensive and they cannot afford those at the jail.

## 2022-12-08 ENCOUNTER — Ambulatory Visit: Payer: Medicaid Other | Admitting: Internal Medicine

## 2022-12-10 ENCOUNTER — Ambulatory Visit: Payer: Medicaid Other | Admitting: Internal Medicine

## 2022-12-10 ENCOUNTER — Encounter: Payer: Self-pay | Admitting: Internal Medicine

## 2022-12-14 ENCOUNTER — Ambulatory Visit: Payer: Medicaid Other | Admitting: Internal Medicine

## 2022-12-21 NOTE — Telephone Encounter (Signed)
scheduled

## 2022-12-22 ENCOUNTER — Ambulatory Visit: Payer: Medicaid Other | Admitting: Nurse Practitioner

## 2023-01-04 ENCOUNTER — Ambulatory Visit: Payer: Medicaid Other | Admitting: Internal Medicine

## 2023-02-10 ENCOUNTER — Ambulatory Visit (INDEPENDENT_AMBULATORY_CARE_PROVIDER_SITE_OTHER): Payer: Medicaid Other | Admitting: Dermatology

## 2023-02-10 VITALS — BP 120/76

## 2023-02-10 DIAGNOSIS — L280 Lichen simplex chronicus: Secondary | ICD-10-CM

## 2023-02-10 DIAGNOSIS — I83891 Varicose veins of right lower extremities with other complications: Secondary | ICD-10-CM | POA: Diagnosis not present

## 2023-02-10 DIAGNOSIS — L309 Dermatitis, unspecified: Secondary | ICD-10-CM

## 2023-02-10 DIAGNOSIS — L732 Hidradenitis suppurativa: Secondary | ICD-10-CM | POA: Diagnosis not present

## 2023-02-10 DIAGNOSIS — L659 Nonscarring hair loss, unspecified: Secondary | ICD-10-CM

## 2023-02-10 MED ORDER — CLINDAMYCIN PHOSPHATE 1 % EX GEL: Freq: Every day | CUTANEOUS | 6 refills | Status: AC

## 2023-02-10 MED ORDER — SPIRONOLACTONE 100 MG PO TABS: ORAL_TABLET | ORAL | 3 refills | Status: DC

## 2023-02-10 MED ORDER — OPZELURA 1.5 % EX CREA: TOPICAL_CREAM | CUTANEOUS | 6 refills | Status: DC

## 2023-02-10 NOTE — Patient Instructions (Addendum)
 Start Spironolactone  100 mg take 1/2 tablet daily for 1 week, may increase to 1 tablet after 1 week  Spironolactone  can cause increased urination and cause blood pressure to decrease. Please watch for signs of lightheadedness and be cautious when changing position. It can sometimes cause breast tenderness or an irregular period in premenopausal women. It can also increase potassium. The increase in potassium usually is not a concern unless you are taking other medicines that also increase potassium, so please be sure your doctor knows all of the other medications you are taking. This medication should not be taken by pregnant women.  This medicine should also not be taken together with sulfa  drugs like Bactrim  (trimethoprim /sulfamethexazole).     Due to recent changes in healthcare laws, you may see results of your pathology and/or laboratory studies on MyChart before the doctors have had a chance to review them. We understand that in some cases there may be results that are confusing or concerning to you. Please understand that not all results are received at the same time and often the doctors may need to interpret multiple results in order to provide you with the best plan of care or course of treatment. Therefore, we ask that you please give us  2 business days to thoroughly review all your results before contacting the office for clarification. Should we see a critical lab result, you will be contacted sooner.   If You Need Anything After Your Visit  If you have any questions or concerns for your doctor, please call our main line at [phone] and press option 4 to reach your doctor's medical assistant. If no one answers, please leave a voicemail as directed and we will return your call as soon as possible. Messages left after 4 pm will be answered the following business day.   You may also send us  a message via MyChart. We typically respond to MyChart messages within 1-2 business days.  For  prescription refills, please ask your pharmacy to contact our office. Our fax number is [phone].  If you have an urgent issue when the clinic is closed that cannot wait until the next business day, you can page your doctor at the number below.    Please note that while we do our best to be available for urgent issues outside of office hours, we are not available 24/7.   If you have an urgent issue and are unable to reach us , you may choose to seek medical care at your doctor's office, retail clinic, urgent care center, or emergency room.  If you have a medical emergency, please immediately call 911 or go to the emergency department.  Pager Numbers  - Dr. Bary Likes: [phone]  - Dr. Annette Barters: [phone]  - Dr. Felipe Horton: [phone]   In the event of inclement weather, please call our main line at [phone] for an update on the status of any delays or closures.  Dermatology Medication Tips: Please keep the boxes that topical medications come in in order to help keep track of the instructions about where and how to use these. Pharmacies typically print the medication instructions only on the boxes and not directly on the medication tubes.   If your medication is too expensive, please contact our office at [phone] option 4 or send us  a message through MyChart.   We are unable to tell what your co-pay for medications will be in advance as this is different depending on your insurance coverage. However, we may be able to find a  substitute medication at lower cost or fill out paperwork to get insurance to cover a needed medication.   If a prior authorization is required to get your medication covered by your insurance company, please allow us  1-2 business days to complete this process.  Drug prices often vary depending on where the prescription is filled and some pharmacies may offer cheaper prices.  The website www.goodrx.com contains coupons for medications through different  pharmacies. The prices here do not account for what the cost may be with help from insurance (it may be cheaper with your insurance), but the website can give you the price if you did not use any insurance.  - You can print the associated coupon and take it with your prescription to the pharmacy.  - You may also stop by our office during regular business hours and pick up a GoodRx coupon card.  - If you need your prescription sent electronically to a different pharmacy, notify our office through [facility] or by phone at [phone] option 4.     Si Usted Necesita Algo Despus de Su Visita  Tambin puede enviarnos un mensaje a travs de Clinical cytogeneticist. Por lo general respondemos a los mensajes de MyChart en el transcurso de 1 a 2 das hbiles.  Para renovar recetas, por favor pida a su farmacia que se ponga en contacto con nuestra oficina. Franz Jacks de fax es [place] [phone].  Si tiene un asunto urgente cuando la clnica est cerrada y que no puede esperar hasta el siguiente da hbil, puede llamar/localizar a su doctor(a) al nmero que aparece a continuacin.   Por favor, tenga en cuenta que aunque hacemos todo lo posible para estar disponibles para asuntos urgentes fuera del horario de [place], no estamos disponibles las 24 horas del da, los 7 [address]  [address] de la [place].   Si tiene un problema urgente y no puede comunicarse con nosotros, puede optar por buscar atencin mdica  en el consultorio de su doctor(a), en una clnica privada, en un centro de atencin urgente o en una sala de emergencias.  Si tiene Engineer, drilling, por favor llame inmediatamente al 911 o vaya a la sala de emergencias.  Nmeros de bper  - Dr. Bary Likes: [phone]  - Dra. Annette Barters: [phone]  - Dr. Felipe Horton: [phone]   En caso de inclemencias del tiempo, por favor llame a Lajuan Pila principal al [phone] para una actualizacin sobre el [place] de cualquier retraso o cierre.  Consejos para la  medicacin en dermatologa: Por favor, guarde las cajas en las que vienen los medicamentos de uso tpico para ayudarle a seguir las instrucciones sobre dnde y cmo usarlos. Las farmacias generalmente imprimen las instrucciones del medicamento slo en las cajas y no directamente en los tubos del [place].   Si su medicamento es muy caro, por favor, pngase en contacto con Bettyjane Brunet llamando al [phone] y presione la opcin 4 o envenos un mensaje a travs de Clinical cytogeneticist.   No podemos decirle cul ser su copago por los medicamentos por adelantado ya que esto es diferente dependiendo de la cobertura de su seguro. Sin embargo, es posible que podamos encontrar un medicamento sustituto a Audiological scientist un formulario para que el seguro cubra el medicamento que se considera necesario.   Si se requiere una autorizacin previa para que su compaa de seguros [place] su medicamento, por favor permtanos de 1 a 2 das hbiles para completar este proceso.  Los precios de los medicamentos varan con frecuencia dependiendo del  lugar de dnde se surte la receta y alguna farmacias pueden ofrecer precios ms baratos.  El sitio web www.goodrx.com tiene cupones para medicamentos de Health and safety inspector. Los precios aqu no tienen en cuenta lo que podra costar con la ayuda del seguro (puede ser ms barato con su seguro), pero el sitio web puede darle el precio si no utiliz Tourist information centre manager.  - Puede imprimir el cupn correspondiente y llevarlo con su receta a la farmacia.  - Tambin puede pasar por nuestra oficina durante el horario de atencin regular y Education officer, museum una tarjeta de cupones de GoodRx.  - Si necesita que su receta se enve electrnicamente a una farmacia diferente, informe a nuestra oficina a travs de MyChart de [place] o por telfono llamando al [phone] y presione la opcin 4.

## 2023-02-10 NOTE — Progress Notes (Signed)
 Follow-Up Visit   Subjective  Kimberly Rocha is a 48 y.o. female who presents for the following: Hidradenitis axilla, scalp, neck, groin, inframammary, pt has been off Humira  (started it 6/23), Spironolactone  and Isotretinoin  (2 months), Last seen 7 months ago.  Flares improved when she was on her medication.  Currently using Clindamycin  gel qd and Opzelura  prn, check spot leg and scalp.  She has felt some tenderness in her R axilla.  She tends to get draining cysts of the labia in the groin.  She also has cysts under her breasts.  Condition is very disruptive to quality of life and and when flared interferes with work due to significant pain, especially when seated and walking, due to groin involvement.   The following portions of the chart were reviewed this encounter and updated as appropriate: medications, allergies, medical history  Review of Systems:  No other skin or systemic complaints except as noted in HPI or Assessment and Plan.  Objective  Well appearing patient in no apparent distress; mood and affect are within normal limits.   A focused examination was performed of the following areas: Scalp, axilla, trunk, groin  Relevant exam findings are noted in the Assessment and Plan.    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  No inflamed cysts at labia today.  Chronic and persistent condition with duration or expected duration over one year. Condition is symptomatic/ bothersome to patient. Not currently at goal.   Hidradenitis Suppurativa is a chronic; persistent; non-curable, but treatable condition due to abnormal inflamed  sweat glands in the body folds (axilla, inframammary, groin, medial thighs), causing recurrent painful draining cysts and scarring. It can be associated with severe scarring acne and cysts; also abscesses and scarring of scalp. The goal is control and prevention of flares, as it is not curable. Scars are permanent and can be thickened. Treatment may include daily use of topical medication and oral antibiotics.  Oral isotretinoin  may also be helpful.  For some cases, Humira  or Cosentyx (biologic injections) may be prescribed to decrease the inflammatory process and prevent flares.  When indicated, inflamed cysts may also be treated surgically.  Treatment Plan: Cont Clindamycin  gel qd to aa  Cont Opzelura  cr every day/bid to aa body/scalp/groin prn itching Pending approval/labs start Bimzelx  320mg /51ml  Restart Spironolactone  100mg  1 po qd, start out with half a pill for a week if no problems increase to 1 po qd Will not restart Humira - was not helping until isotretinoin  and spironolactone  was added.  Will not start isotretinoin  at this time, unless Bimzelx  not approved.  Reviewed risks of biologics including immunosuppression, infections, injection site reaction, and failure to improve condition. Goal is control of skin condition, not cure.  Some older biologics such as Humira  and Enbrel may slightly increase risk of malignancy and may worsen congestive heart failure.  Taltz and Cosentyx may cause inflammatory bowel disease to flare. The use of biologics requires long term medication management, including periodic office visits and monitoring of blood work.  Spironolactone  can cause increased urination and cause blood pressure to decrease. Please watch for signs of lightheadedness and be cautious when changing position. It can sometimes cause breast tenderness or an irregular  period in premenopausal women. It can also increase potassium. The increase in potassium usually is not a concern unless you are  taking other medicines that also increase potassium, so please be sure your doctor knows all of the other medications you are taking. This medication should not be taken by pregnant women.  This medicine should also not be taken together with sulfa  drugs like Bactrim  (trimethoprim /sulfamethexazole).     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  No associated swelling or erythema  Treatment Plan: Recommend pt f/u with Vascular Surgeon for further evaluation.  Pt has h/o blot clot causing PE in past.  Pt has appointment this week.   HIDRADENITIS SUPPURATIVA   Related Procedures Comprehensive metabolic panel CBC with Differential/Platelet QuantiFERON-TB Gold Plus Related Medications clindamycin  (CLINDAGEL) 1 % gel Apply topically daily. Qd to aa rash groin, under breast, axilla spironolactone  (ALDACTONE ) 100 MG tablet TAKE 1 TABLET BY MOUTH DAILY ALOPECIA   Related Medications Clobetasol  Prop Emollient Base (CLOBETASOL  PROPIONATE E) 0.05 % emollient cream Apply to affected area qd Ruxolitinib Phosphate  (OPZELURA ) 1.5 % CREA Apply to affected area qd  Return in about 1 month (around 03/13/2023) for HS.  I, Sonya Hupman, RMA, am acting as scribe for Artemio Larry, MD .   Documentation: I have reviewed the above documentation for accuracy and completeness, and I agree with the above.  Artemio Larry, MD

## 2023-03-01 ENCOUNTER — Encounter: Payer: Self-pay | Admitting: Dermatology

## 2023-03-03 ENCOUNTER — Telehealth: Payer: Self-pay

## 2023-03-03 DIAGNOSIS — L732 Hidradenitis suppurativa: Secondary | ICD-10-CM

## 2023-03-03 LAB — COMPREHENSIVE METABOLIC PANEL
ALT: 8 [IU]/L (ref 0–32)
AST: 13 [IU]/L (ref 0–40)
Albumin: 4.2 g/dL (ref 3.9–4.9)
Alkaline Phosphatase: 87 [IU]/L (ref 44–121)
BUN/Creatinine Ratio: 10 (ref 9–23)
BUN: 10 mg/dL (ref 6–24)
Bilirubin Total: 0.2 mg/dL (ref 0.0–1.2)
CO2: 23 mmol/L (ref 20–29)
Calcium: 8.9 mg/dL (ref 8.7–10.2)
Chloride: 102 mmol/L (ref 96–106)
Creatinine, Ser: 1.04 mg/dL — ABNORMAL HIGH (ref 0.57–1.00)
Globulin, Total: 2.6 g/dL (ref 1.5–4.5)
Glucose: 93 mg/dL (ref 70–99)
Potassium: 4.5 mmol/L (ref 3.5–5.2)
Sodium: 140 mmol/L (ref 134–144)
Total Protein: 6.8 g/dL (ref 6.0–8.5)
eGFR: 66 mL/min/{1.73_m2} (ref >59)

## 2023-03-03 LAB — CBC WITH DIFFERENTIAL/PLATELET
Basophils Absolute: 0 10*3/uL (ref 0.0–0.2)
Basos: 0 %
EOS (ABSOLUTE): 0 10*3/uL (ref 0.0–0.4)
Eos: 1 %
Hematocrit: 38.5 % (ref 34.0–46.6)
Hemoglobin: 13 g/dL (ref 11.1–15.9)
Immature Grans (Abs): 0 10*3/uL (ref 0.0–0.1)
Immature Granulocytes: 0 %
Lymphocytes Absolute: 3.3 10*3/uL — ABNORMAL HIGH (ref 0.7–3.1)
Lymphs: 44 %
MCH: 33 pg (ref 26.6–33.0)
MCHC: 33.8 g/dL (ref 31.5–35.7)
MCV: 98 fL — ABNORMAL HIGH (ref 79–97)
Monocytes Absolute: 0.6 10*3/uL (ref 0.1–0.9)
Monocytes: 7 %
Neutrophils Absolute: 3.6 10*3/uL (ref 1.4–7.0)
Neutrophils: 48 %
Platelets: 213 10*3/uL (ref 150–450)
RBC: 3.94 x10E6/uL (ref 3.77–5.28)
RDW: 13 % (ref 11.7–15.4)
WBC: 7.5 10*3/uL (ref 3.4–10.8)

## 2023-03-03 LAB — QUANTIFERON-TB GOLD PLUS
QuantiFERON Mitogen Value: >10 [IU]/mL
QuantiFERON Nil Value: 0.04 [IU]/mL
QuantiFERON TB1 Ag Value: 0.01 [IU]/mL
QuantiFERON TB2 Ag Value: 0.03 [IU]/mL
QuantiFERON-TB Gold Plus: NEGATIVE

## 2023-03-03 MED ORDER — BIMZELX 160 MG/ML ~~LOC~~ SOAJ: 320.0000 mg | SUBCUTANEOUS | 0 refills | Status: DC

## 2023-03-03 MED ORDER — BIMZELX 160 MG/ML ~~LOC~~ SOAJ: 320.0000 mg | SUBCUTANEOUS | 1 refills | Status: DC

## 2023-03-03 NOTE — Telephone Encounter (Signed)
-----   Message from Artemio Larry sent at 03/03/2023  8:36 AM EST ----- Labs ok, TB negative, please send in Rx for Bimzelx  for HS to [facility], q 4 wk dosing - please call patient

## 2023-03-03 NOTE — Telephone Encounter (Signed)
Patient informed of lab results. Prescription sent to pharmacy.

## 2023-03-04 ENCOUNTER — Ambulatory Visit: Payer: Medicaid Other | Admitting: Primary Care

## 2023-03-04 ENCOUNTER — Encounter: Payer: Self-pay | Admitting: Primary Care

## 2023-03-04 NOTE — Progress Notes (Deleted)
 @Patient  ID: Kimberly Rocha, female    DOB: Jul 25, 1975, 48 y.o.   MRN: [id number]  No chief complaint on file.   Referring provider: Melvenia Manus BRAVO, MD  HPI:        Allergies  Allergen Reactions   Demerol Hives and Itching    Immunization History  Administered Date(s) Administered   Influenza,inj,Quad PF,6+ Mos 09/18/2021   Moderna Sars-Covid-2 Vaccination 02/27/2020, 03/27/2020   PNEUMOCOCCAL CONJUGATE-20 10/30/2021, 01/29/2022   Tdap 09/18/2021    Past Medical History:  Diagnosis Date   Anxiety    Borderline high cholesterol    Borderline hypertension    COPD (chronic obstructive pulmonary disease) (HCC)    Depression    Gallstones    Hyperthyroidism 09/17/2020   Thyroid  disease     Tobacco History: Social History   Tobacco Use  Smoking Status Former   Current packs/day: 0.50   Types: Cigarettes  Smokeless Tobacco Never   Counseling given: Not Answered   Outpatient Medications Prior to Visit  Medication Sig Dispense Refill   acetaminophen  (TYLENOL ) 650 MG CR tablet Take 1-2 tablets (650-1,300 mg total) by mouth 2 (two) times daily as needed for pain. 120 tablet 0   Adalimumab  (HUMIRA , 2 PEN,) 80 MG/0.8ML PNKT Inject 80 mg into the skin every 14 (fourteen) days. Starting on day 29. 2 each 6   bimekizumab -bkzx (BIMZELX ) 160 MG/ML pen Inject 2 mLs (320 mg total) into the skin as directed. Loading doses. Inject 2 pens (320mg  total) SQ Q2W starting at week 0,2,4,6,8,10,12,14, and 16. 18 mL 0   bimekizumab -bkzx (BIMZELX ) 160 MG/ML pen Inject 2 mLs (320 mg total) into the skin every 28 (twenty-eight) days. Maintenance dose. Inject 2 pens (320mg  total) SQ Q4W. 2 mL 1   clindamycin  (CLINDAGEL) 1 % gel Apply topically daily. Qd to aa rash groin, under breast, axilla 60 g 6   Clobetasol  Prop Emollient Base (CLOBETASOL  PROPIONATE E) 0.05 % emollient cream Apply to affected area qd 60 g 9   FLUoxetine  (PROZAC ) 20 MG tablet Take 1.5 tablets (30 mg total) by mouth  daily. 90 tablet 3   HYDROcodone -acetaminophen  (NORCO/VICODIN) 5-325 MG tablet Take 1 tablet by mouth every 4 (four) hours as needed for moderate pain. 12 tablet 0   hydrOXYzine  (VISTARIL ) 50 MG capsule TAKE 1 CAPSULE(50 MG) BY MOUTH THREE TIMES DAILY AS NEEDED 90 capsule 3   ISOtretinoin  (ABSORICA ) 30 MG capsule Take 1 capsule (30 mg total) by mouth daily. 30 capsule 0   levothyroxine  (SYNTHROID ) 137 MCG tablet Take 0.5 tablets (68.5 mcg total) by mouth daily before breakfast. 45 tablet 1   mirtazapine  (REMERON  SOL-TAB) 15 MG disintegrating tablet Take 1 tablet (15 mg total) by mouth at bedtime. 30 tablet 2   nystatin  (MYCOSTATIN ) 100000 UNIT/ML suspension TAKE 5 MLS (500,000 UNITS TOTAL) BY MOUTH AS DIRECTED. SWISH 5 ML AS LONG AS POSSIBLE AND SPIT OUT 3-4 TIMES DAILY 60 mL 2   progesterone  (PROMETRIUM ) 100 MG capsule TAKE 1 CAPSULE(100 MG) BY MOUTH AT BEDTIME 30 capsule 2   Ruxolitinib Phosphate  (OPZELURA ) 1.5 % CREA Apply to affected area qd 60 g 6   spironolactone  (ALDACTONE ) 100 MG tablet TAKE 1 TABLET BY MOUTH DAILY 30 tablet 3   umeclidinium-vilanterol (ANORO ELLIPTA ) 62.5-25 MCG/ACT AEPB Inhale 1 puff into the lungs daily. 30 each 5   VENTOLIN  HFA 108 (90 Base) MCG/ACT inhaler Inhale 2 puffs into the lungs every 6 (six) hours as needed for wheezing or shortness of breath. 6.7 g 5  No facility-administered medications prior to visit.      Review of Systems  Review of Systems   Physical Exam  There were no vitals taken for this visit. Physical Exam   Lab Results:  CBC    Component Value Date/Time   WBC 7.5 02/25/2023 0904   WBC 7.9 04/15/2022 1307   RBC 3.94 02/25/2023 0904   RBC 3.71 (L) 04/15/2022 1307   HGB 13.0 02/25/2023 0904   HCT 38.5 02/25/2023 0904   PLT 213 02/25/2023 0904   MCV 98 (H) 02/25/2023 0904   MCH 33.0 02/25/2023 0904   MCH 34.0 04/15/2022 1307   MCHC 33.8 02/25/2023 0904   MCHC 34.5 04/15/2022 1307   RDW 13.0 02/25/2023 0904   LYMPHSABS 3.3  (H) 02/25/2023 0904   MONOABS 0.6 04/15/2022 1307   EOSABS 0.0 02/25/2023 0904   BASOSABS 0.0 02/25/2023 0904    BMET    Component Value Date/Time   NA 140 02/25/2023 0904   K 4.5 02/25/2023 0904   CL 102 02/25/2023 0904   CO2 23 02/25/2023 0904   GLUCOSE 93 02/25/2023 0904   GLUCOSE 97 04/15/2022 1304   BUN 10 02/25/2023 0904   CREATININE 1.04 (H) 02/25/2023 0904   CREATININE 1.04 (H) 07/09/2021 1057   CALCIUM  8.9 02/25/2023 0904   GFRNONAA >60 04/15/2022 1304   GFRAA >60 10/17/2018 1545    BNP No results found for: BNP  ProBNP No results found for: PROBNP  Imaging: No results found.   Assessment & Plan:   No problem-specific Assessment & Plan notes found for this encounter.     Almarie LELON Ferrari, NP 03/04/2023

## 2023-03-09 NOTE — Patient Instructions (Signed)

## 2023-03-12 ENCOUNTER — Encounter: Payer: Self-pay | Admitting: Internal Medicine

## 2023-03-12 ENCOUNTER — Ambulatory Visit: Payer: Medicaid Other | Admitting: Internal Medicine

## 2023-03-12 ENCOUNTER — Ambulatory Visit (INDEPENDENT_AMBULATORY_CARE_PROVIDER_SITE_OTHER): Payer: Medicaid Other | Admitting: Nurse Practitioner

## 2023-03-12 ENCOUNTER — Encounter: Payer: Self-pay | Admitting: Nurse Practitioner

## 2023-03-12 VITALS — BP 125/82 | HR 96 | Ht 60.0 in | Wt 131.6 lb

## 2023-03-12 VITALS — BP 100/66 | HR 89 | Ht 60.0 in | Wt 129.4 lb

## 2023-03-12 DIAGNOSIS — Z1211 Encounter for screening for malignant neoplasm of colon: Secondary | ICD-10-CM | POA: Diagnosis not present

## 2023-03-12 DIAGNOSIS — Z1231 Encounter for screening mammogram for malignant neoplasm of breast: Secondary | ICD-10-CM

## 2023-03-12 DIAGNOSIS — F418 Other specified anxiety disorders: Secondary | ICD-10-CM

## 2023-03-12 DIAGNOSIS — F410 Panic disorder [episodic paroxysmal anxiety] without agoraphobia: Secondary | ICD-10-CM | POA: Diagnosis not present

## 2023-03-12 DIAGNOSIS — R232 Flushing: Secondary | ICD-10-CM

## 2023-03-12 DIAGNOSIS — E890 Postprocedural hypothyroidism: Secondary | ICD-10-CM

## 2023-03-12 DIAGNOSIS — R2241 Localized swelling, mass and lump, right lower limb: Secondary | ICD-10-CM

## 2023-03-12 LAB — TSH: TSH: 24.4 u[IU]/mL — ABNORMAL HIGH (ref 0.450–4.500)

## 2023-03-12 LAB — T4, FREE: Free T4: 0.94 ng/dL (ref 0.82–1.77)

## 2023-03-12 MED ORDER — PROGESTERONE MICRONIZED 100 MG PO CAPS: ORAL_CAPSULE | ORAL | 2 refills | Status: DC

## 2023-03-12 MED ORDER — HYDROXYZINE PAMOATE 50 MG PO CAPS: 50.0000 mg | ORAL_CAPSULE | Freq: Three times a day (TID) | ORAL | 3 refills | Status: DC | PRN

## 2023-03-12 MED ORDER — FLUOXETINE HCL 40 MG PO CAPS: 40.0000 mg | ORAL_CAPSULE | Freq: Every day | ORAL | 3 refills | Status: DC

## 2023-03-12 MED ORDER — MIRTAZAPINE 15 MG PO TBDP: 15.0000 mg | ORAL_TABLET | Freq: Every day | ORAL | 2 refills | Status: DC

## 2023-03-12 MED ORDER — LEVOTHYROXINE SODIUM 137 MCG PO TABS: 62.5000 ug | ORAL_TABLET | Freq: Every day | ORAL | 3 refills | Status: DC

## 2023-03-12 NOTE — Progress Notes (Signed)
03/12/2023     Endocrinology Follow Up Note     Subjective:    Patient ID: Kimberly Rocha, female    DOB: 05/23/1975, PCP Billie Lade, MD.   Past Medical History:  Diagnosis Date   Anxiety    Borderline high cholesterol    Borderline hypertension    COPD (chronic obstructive pulmonary disease) (HCC)    Depression    Gallstones    Hyperthyroidism 09/17/2020   Thyroid disease     Past Surgical History:  Procedure Laterality Date   CESAREAN SECTION     CHOLECYSTECTOMY N/A 08/22/2012   Procedure: LAPAROSCOPIC CHOLECYSTECTOMY;  Surgeon: Dalia Heading, MD;  Location: [facility];  Service: General;  Laterality: N/A;   GALLBLADDER SURGERY     HERNIA REPAIR     TRIGGER FINGER RELEASE Left 02/24/2022   Procedure: RELEASE TRIGGER FINGER/A-1 PULLEY left thumb;  Surgeon: Vickki Hearing, MD;  Location: [facility];  Service: Orthopedics;  Laterality: Left;   TUBAL LIGATION      Social History   Socioeconomic History   Marital status: Single    Spouse name: Not on file   Number of children: Not on file   Years of education: Not on file   Highest education level: Associate degree: academic program  Occupational History   Not on file  Tobacco Use   Smoking status: Former    Current packs/day: 0.50    Types: Cigarettes   Smokeless tobacco: Never  Vaping Use   Vaping status: Former  Substance and Sexual Activity   Alcohol use: Yes    Comment: occ   Drug use: Yes    Frequency: 3.0 times per week    Types: Marijuana    Comment: twice a day   Sexual activity: Yes    Birth control/protection: Surgical    Comment: tubal  Other Topics Concern   Not on file  Social History Narrative   Not on file   Social Drivers of Health   Financial Resource Strain: Medium Risk (01/02/2023)   Overall Financial Resource Strain (CARDIA)    Difficulty of Paying Living Expenses: Somewhat hard  Food Insecurity: Food Insecurity Present (01/02/2023)   Hunger Vital Sign    Worried  About Running Out of Food in the Last Year: Sometimes true    Ran Out of Food in the Last Year: Sometimes true  Transportation Needs: Unmet Transportation Needs (01/02/2023)   PRAPARE - Transportation    Lack of Transportation (Medical): Yes    Lack of Transportation (Non-Medical): Yes  Physical Activity: Sufficiently Active (01/02/2023)   Exercise Vital Sign    Days of Exercise per Week: 7 days    Minutes of Exercise per Session: 30 min  Stress: Stress Concern Present (01/02/2023)   [employer] of Occupational Health - Occupational Stress Questionnaire    Feeling of Stress : Very much  Social Connections: Socially Isolated (01/02/2023)   Social Connection and Isolation Panel [NHANES]    Frequency of Communication with Friends and Family: Twice a week    Frequency of Social Gatherings with Friends and Family: Never    Attends Religious Services: 1 to 4 times per year    Active Member of [employer] or Organizations: No    Attends Engineer, structural: Not on file    Marital Status: Never married    Family History  Problem Relation Age of Onset   COPD Mother    COPD Father    HIV/AIDS Brother  Stroke Maternal Grandmother    Other Daughter        has issues with cervix    Outpatient Encounter Medications as of 03/12/2023  Medication Sig   clindamycin (CLINDAGEL) 1 % gel Apply topically daily. Qd to aa rash groin, under breast, axilla   FLUoxetine (PROZAC) 20 MG tablet Take 1.5 tablets (30 mg total) by mouth daily.   hydrOXYzine (VISTARIL) 50 MG capsule TAKE 1 CAPSULE(50 MG) BY MOUTH THREE TIMES DAILY AS NEEDED   mirtazapine (REMERON SOL-TAB) 15 MG disintegrating tablet Take 1 tablet (15 mg total) by mouth at bedtime.   nystatin (MYCOSTATIN) 100000 UNIT/ML suspension TAKE 5 MLS (500,000 UNITS TOTAL) BY MOUTH AS DIRECTED. SWISH 5 ML AS LONG AS POSSIBLE AND SPIT OUT 3-4 TIMES DAILY   progesterone (PROMETRIUM) 100 MG capsule TAKE 1 CAPSULE(100 MG) BY MOUTH AT BEDTIME    Ruxolitinib Phosphate (OPZELURA) 1.5 % CREA Apply to affected area qd   spironolactone (ALDACTONE) 100 MG tablet TAKE 1 TABLET BY MOUTH DAILY   VENTOLIN HFA 108 (90 Base) MCG/ACT inhaler Inhale 2 puffs into the lungs every 6 (six) hours as needed for wheezing or shortness of breath.   [DISCONTINUED] levothyroxine (SYNTHROID) 137 MCG tablet Take 0.5 tablets (68.5 mcg total) by mouth daily before breakfast.   acetaminophen (TYLENOL) 650 MG CR tablet Take 1-2 tablets (650-1,300 mg total) by mouth 2 (two) times daily as needed for pain.   Adalimumab (HUMIRA, 2 PEN,) 80 MG/0.8ML PNKT Inject 80 mg into the skin every 14 (fourteen) days. Starting on day 29. (Patient not taking: Reported on 03/12/2023)   bimekizumab-bkzx (BIMZELX) 160 MG/ML pen Inject 2 mLs (320 mg total) into the skin as directed. Loading doses. Inject 2 pens (320mg  total) SQ Q2W starting at week 0,2,4,6,8,10,12,14, and 16.   bimekizumab-bkzx (BIMZELX) 160 MG/ML pen Inject 2 mLs (320 mg total) into the skin every 28 (twenty-eight) days. Maintenance dose. Inject 2 pens (320mg  total) SQ Q4W. (Patient not taking: Reported on 03/12/2023)   Clobetasol Prop Emollient Base (CLOBETASOL PROPIONATE E) 0.05 % emollient cream Apply to affected area qd   HYDROcodone-acetaminophen (NORCO/VICODIN) 5-325 MG tablet Take 1 tablet by mouth every 4 (four) hours as needed for moderate pain.   ISOtretinoin (ABSORICA) 30 MG capsule Take 1 capsule (30 mg total) by mouth daily. (Patient not taking: Reported on 03/12/2023)   levothyroxine (SYNTHROID) 137 MCG tablet Take 0.5 tablets (68.5 mcg total) by mouth daily before breakfast.   umeclidinium-vilanterol (ANORO ELLIPTA) 62.5-25 MCG/ACT AEPB Inhale 1 puff into the lungs daily.   [DISCONTINUED] clindamycin (CLINDAGEL) 1 % gel Apply topically daily. Qd to aa rash groin, under breast   [DISCONTINUED] Ruxolitinib Phosphate (OPZELURA) 1.5 % CREA Apply to affected area qd   [DISCONTINUED] spironolactone (ALDACTONE) 100 MG  tablet TAKE 1 TABLET BY MOUTH DAILY   No facility-administered encounter medications on file as of 03/12/2023.    ALLERGIES: Allergies  Allergen Reactions   Demerol Hives and Itching    VACCINATION STATUS: Immunization History  Administered Date(s) Administered   Influenza,inj,Quad PF,6+ Mos 09/18/2021   Moderna Sars-Covid-2 Vaccination 02/27/2020, 03/27/2020   PNEUMOCOCCAL CONJUGATE-20 10/30/2021, 01/29/2022   Tdap 09/18/2021     HPI  Kimberly Rocha is 48 y.o. female who presents today with a medical history as above. she is being seen in follow up after being seen in consultation for hyperthyroidism requested by Billie Lade, MD.    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  Treatment  was successful and she has now been started on thyroid hormone replacement.  she denies dysphagia, choking, no recent voice change.    she denies family history of thyroid dysfunction and denies family hx of thyroid cancer. she denies personal history of goiter. she is not on any thyroid hormone supplements, has been taking antithyroid medication for approximately 30 days. Denies use of Biotin containing supplements.  she is willing to proceed with appropriate work up and therapy for thyrotoxicosis.   Review of systems  Constitutional: + Minimally fluctuating body weight,  current Body mass index is 25.27 kg/m. , no fatigue, no subjective hyperthermia, no subjective hypothermia Eyes: no blurry vision, no xerophthalmia ENT: no sore throat, no nodules palpated in throat, no dysphagia/odynophagia, no hoarseness Cardiovascular: no chest pain, no shortness of breath, no palpitations, no leg swelling Respiratory: no cough, no shortness of breath Gastrointestinal: no nausea/vomiting/diarrhea Musculoskeletal: no muscle/joint aches Skin: no rashes, no hyperemia Neurological: no tremors, no numbness, no tingling, no dizziness Psychiatric: no depression, no  anxiety   Objective:    BP 100/66 (BP Location: Left Arm, Patient Position: Sitting, Cuff Size: Large)   Pulse 89   Ht 5' (1.524 m)   Wt 129 lb 6.4 oz (58.7 kg)   BMI 25.27 kg/m   Wt Readings from Last 3 Encounters:  03/12/23 129 lb 6.4 oz (58.7 kg)  07/01/22 120 lb 12.8 oz (54.8 kg)  05/25/22 114 lb 9.6 oz (52 kg)     BP Readings from Last 3 Encounters:  03/12/23 100/66  02/10/23 120/76  07/01/22 123/85                        Physical Exam- Limited  Constitutional:  Body mass index is 25.27 kg/m. , not in acute distress, normal state of mind Eyes:  EOMI, no exophthalmos Musculoskeletal: no gross deformities, strength intact in all four extremities, no gross restriction of joint movements Skin:  no rashes, no hyperemia Neurological: no tremor with outstretched hands   CMP     Component Value Date/Time   NA 140 02/25/2023 0904   K 4.5 02/25/2023 0904   CL 102 02/25/2023 0904   CO2 23 02/25/2023 0904   GLUCOSE 93 02/25/2023 0904   GLUCOSE 97 04/15/2022 1304   BUN 10 02/25/2023 0904   CREATININE 1.04 (H) 02/25/2023 0904   CREATININE 1.04 (H) 07/09/2021 1057   CALCIUM 8.9 02/25/2023 0904   PROT 6.8 02/25/2023 0904   ALBUMIN 4.2 02/25/2023 0904   AST 13 02/25/2023 0904   ALT 8 02/25/2023 0904   ALKPHOS 87 02/25/2023 0904   BILITOT 0.2 02/25/2023 0904   GFRNONAA >60 04/15/2022 1304   GFRAA >60 10/17/2018 1545     CBC    Component Value Date/Time   WBC 7.5 02/25/2023 0904   WBC 7.9 04/15/2022 1307   RBC 3.94 02/25/2023 0904   RBC 3.71 (L) 04/15/2022 1307   HGB 13.0 02/25/2023 0904   HCT 38.5 02/25/2023 0904   PLT 213 02/25/2023 0904   MCV 98 (H) 02/25/2023 0904   MCH 33.0 02/25/2023 0904   MCH 34.0 04/15/2022 1307   MCHC 33.8 02/25/2023 0904   MCHC 34.5 04/15/2022 1307   RDW 13.0 02/25/2023 0904   LYMPHSABS 3.3 (H) 02/25/2023 0904   MONOABS 0.6 04/15/2022 1307   EOSABS 0.0 02/25/2023 0904   BASOSABS 0.0 02/25/2023 0904     Diabetic Labs (most  recent): Lab Results  Component Value Date   HGBA1C  5.5 04/15/2022    Lipid Panel     Component Value Date/Time   CHOL 240 (H) 04/15/2022 1304   CHOL 233 (H) 07/25/2020 1029   TRIG 47 04/15/2022 1304   HDL 60 04/15/2022 1304   HDL 47 07/25/2020 1029   CHOLHDL 4.0 04/15/2022 1304   VLDL 9 04/15/2022 1304   LDLCALC 171 (H) 04/15/2022 1304   LDLCALC 175 (H) 07/25/2020 1029   LABVLDL 11 07/25/2020 1029     Lab Results  Component Value Date   TSH 24.400 (H) 03/11/2023   TSH 3.670 08/17/2022   TSH 8.740 (H) 06/26/2022   TSH 4.263 04/15/2022   TSH 0.020 (L) 01/02/2022   TSH 0.955 10/21/2021   TSH 2.460 08/25/2021   TSH 6.570 (H) 05/28/2021   TSH 12.100 (H) 03/26/2021   TSH 7.990 (H) 01/21/2021   FREET4 0.94 03/11/2023   FREET4 1.32 08/17/2022   FREET4 0.78 (L) 06/26/2022   FREET4 0.95 04/15/2022   FREET4 2.10 (H) 01/02/2022   FREET4 1.31 10/21/2021   FREET4 1.43 08/25/2021   FREET4 1.24 05/28/2021   FREET4 0.82 03/26/2021   FREET4 1.01 01/21/2021    Results for Geis, Aayra A "TOYE" (MRN [id number]) as of 11/29/2020 09:49  Ref. Range 11/27/2020 13:14 11/27/2020 13:15  TSH Latest Ref Range: 0.450 - 4.500 uIU/mL 1.370 1.290  Triiodothyronine,Free,Serum Latest Ref Range: 2.0 - 4.4 pg/mL 2.8 2.9  T4,Free(Direct) Latest Ref Range: 0.82 - 1.77 ng/dL 8.41 3.24  Thyroperoxidase Ab SerPl-aCnc Latest Ref Range: 0 - 34 IU/mL  9  Thyroglobulin Antibody Latest Ref Range: 0.0 - 0.9 IU/mL  <1.0    Uptake and Scan from 10/29/20 CLINICAL DATA:  Hyperthyroidism   EXAM: THYROID SCAN AND UPTAKE - 4 AND 24 HOURS   TECHNIQUE: Following oral administration of I-123 capsule, anterior planar imaging was acquired at 24 hours. Thyroid uptake was calculated with a thyroid probe at 4-6 hours and 24 hours.   RADIOPHARMACEUTICALS:  431 uCi I-123 sodium iodide p.o.   COMPARISON:  Thyroid ultrasound 10/07/2020   FINDINGS: Homogeneous tracer distribution in both thyroid lobes.   No focal  areas of increased or decreased tracer localization.   4 hour I-123 uptake = 21.7% (normal 5-20%)   24 hour I-123 uptake = 44.3% (normal 10-30%)   IMPRESSION: Normal thyroid scan with elevated 4 hour and 24 hour radio iodine uptakes.   Findings discussed with hyperthyroidism and Graves disease.     Electronically Signed   By: Ulyses Southward M.D.   On: 10/29/2020 17:17   Latest Reference Range & Units 08/25/21 11:11 10/21/21 10:29 01/02/22 11:32  TSH 0.450 - 4.500 uIU/mL 2.460 0.955 0.020 (L)  T4,Free(Direct) 0.82 - 1.77 ng/dL 4.01 0.27 2.53 (H)  (L): Data is abnormally low (H): Data is abnormally high  Assessment & Plan:   1. Hypothyroidism s/p RAI ablation for Graves Disease  she is being seen at a kind request of Billie Lade, MD.  Her uptake and scan did show 4-hr uptake of 21.7% and 24-hr uptake of 44.3%, consistent with Graves disease and she had RAI ablation on 12/13/20.    Her previsit thyroid function tests are consistent with under-replacement, however she reports there was a period of time where she went without her thyroid hormone briefly.  She is advised to continue Levothyroxine 62.5 mcg po daily before breakfast (half of the 137 mcg tabs) and repeat labs in 3 months.   - The correct intake of thyroid hormone (Levothyroxine, Synthroid), is on empty  stomach first thing in the morning, with water, separated by at least 30 minutes from breakfast and other medications,  and separated by more than 4 hours from calcium, iron, multivitamins, acid reflux medications (PPIs).  - This medication is a life-long medication and will be needed to correct thyroid hormone imbalances for the rest of your life.  The dose may change from time to time, based on thyroid blood work.  - It is extremely important to be consistent taking this medication, near the same time each morning.  -AVOID TAKING PRODUCTS CONTAINING BIOTIN (commonly found in Hair, Skin, Nails vitamins) AS IT  INTERFERES WITH THE VALIDITY OF THYROID FUNCTION BLOOD TESTS.    -Patient is advised to maintain close follow up with Durwin Nora Lucina Mellow, MD for primary care needs.    I spent  21  minutes in the care of the patient today including review of labs from Thyroid Function, CMP, and other relevant labs ; imaging/biopsy records (current and previous including abstractions from other facilities); face-to-face time discussing  her lab results and symptoms, medications doses, her options of short and long term treatment based on the latest standards of care / guidelines;   and documenting the encounter.  Kimberly Rocha  participated in the discussions, expressed understanding, and voiced agreement with the above plans.  All questions were answered to her satisfaction. she is encouraged to contact clinic should she have any questions or concerns prior to her return visit.  Follow up plan: Return in about 3 months (around 06/09/2023) for Thyroid follow up, Previsit labs.   Thank you for involving me in the care of this pleasant patient, and I will continue to update you with her progress.   Ronny Bacon, [facility] [address] [place], [place] [address] Phone: [phone] Fax: [phone]  03/12/2023, 9:00 AM

## 2023-03-12 NOTE — Progress Notes (Signed)
 Established Patient Office Visit  Subjective   Patient ID: Kimberly Rocha, female    DOB: 08-22-1975  Age: 48 y.o. MRN: [id number]  Chief Complaint  Patient presents with   Care Management    Pt reports sx of anxiety, reports feet clenching and tightening her jaw until it hurts, ongoing for 5 months. Also her hand jerking when she is typing, ongoing for about 1 month.    Kimberly Rocha returns to care today for follow-up.  She was last evaluated by me in April 2024 for an acute visit in the setting of anxiety and depression.  Fluoxetine was increased to 30 mg daily and mirtazapine 15 mg daily was resumed.  In the interim, she was evaluated by dermatology for follow-up last month.  She reports that she was incarcerated last fall and has been off all medication.  Her acute concern today is her anxiety, which she feels is poorly controlled.  She states that she does not feel like herself and she states that her feet clench and her jaw tightens until it hurts.  She is tearful when discussing her current symptoms and would like to resume medication.  Past Medical History:  Diagnosis Date   Anxiety    Borderline high cholesterol    Borderline hypertension    COPD (chronic obstructive pulmonary disease) (HCC)    Depression    Gallstones    Hyperthyroidism 09/17/2020   Thyroid disease    Past Surgical History:  Procedure Laterality Date   CESAREAN SECTION     CHOLECYSTECTOMY N/A 08/22/2012   Procedure: LAPAROSCOPIC CHOLECYSTECTOMY;  Surgeon: Dalia Heading, MD;  Location: [facility];  Service: General;  Laterality: N/A;   GALLBLADDER SURGERY     HERNIA REPAIR     TRIGGER FINGER RELEASE Left 02/24/2022   Procedure: RELEASE TRIGGER FINGER/A-1 PULLEY left thumb;  Surgeon: Vickki Hearing, MD;  Location: [facility];  Service: Orthopedics;  Laterality: Left;   TUBAL LIGATION     Social History   Tobacco Use   Smoking status: Former    Current packs/day: 0.50    Types: Cigarettes   Smokeless  tobacco: Never  Vaping Use   Vaping status: Former  Substance Use Topics   Alcohol use: Yes    Comment: occ   Drug use: Yes    Frequency: 3.0 times per week    Types: Marijuana    Comment: twice a day   Family History  Problem Relation Age of Onset   COPD Mother    COPD Father    HIV/AIDS Brother    Stroke Maternal Grandmother    Other Daughter        has issues with cervix   Allergies  Allergen Reactions   Demerol Hives and Itching   Review of Systems  Constitutional:  Negative for chills and fever.  HENT:  Negative for sore throat.   Respiratory:  Negative for cough and shortness of breath.   Cardiovascular:  Negative for chest pain, palpitations and leg swelling.  Gastrointestinal:  Negative for abdominal pain, blood in stool, constipation, diarrhea, nausea and vomiting.  Genitourinary:  Negative for dysuria and hematuria.  Musculoskeletal:  Negative for myalgias.  Skin:  Negative for itching and rash.  Neurological:  Negative for dizziness and headaches.  Psychiatric/Behavioral:  Negative for depression and suicidal ideas. The patient is nervous/anxious.      Objective:     BP 125/82   Pulse 96   Ht 5' (1.524 m)   [employer]  131 lb 9.6 oz (59.7 kg)   SpO2 97%   BMI 25.70 kg/m  BP Readings from Last 3 Encounters:  03/12/23 125/82  03/12/23 100/66  02/10/23 120/76   Physical Exam Vitals reviewed.  Constitutional:      General: She is not in acute distress.    Appearance: Normal appearance. She is not toxic-appearing.  HENT:     Head: Normocephalic and atraumatic.     Right Ear: External ear normal.     Left Ear: External ear normal.     Nose: Nose normal. No congestion or rhinorrhea.     Mouth/Throat:     Mouth: Mucous membranes are moist.     Pharynx: Oropharynx is clear. No oropharyngeal exudate or posterior oropharyngeal erythema.  Eyes:     General: No scleral icterus.    Extraocular Movements: Extraocular movements intact.     Conjunctiva/sclera:  Conjunctivae normal.     Pupils: Pupils are equal, round, and reactive to light.  Cardiovascular:     Rate and Rhythm: Normal rate and regular rhythm.     Pulses: Normal pulses.     Heart sounds: Normal heart sounds. No murmur heard.    No friction rub. No gallop.  Pulmonary:     Effort: Pulmonary effort is normal.     Breath sounds: Normal breath sounds. No wheezing, rhonchi or rales.  Abdominal:     General: Abdomen is flat. Bowel sounds are normal. There is no distension.     Palpations: Abdomen is soft.     Tenderness: There is no abdominal tenderness.  Musculoskeletal:        General: No swelling. Normal range of motion.     Cervical back: Normal range of motion.     Right lower leg: No edema.     Left lower leg: No edema.  Lymphadenopathy:     Cervical: No cervical adenopathy.  Skin:    General: Skin is warm and dry.     Capillary Refill: Capillary refill takes less than 2 seconds.     Coloration: Skin is not jaundiced.     Findings: Lesion (Subcutaneous nodule noted on right lower leg.  No significant warmth, erythema, purulence, or tenderness to palpation.) present.  Neurological:     General: No focal deficit present.     Mental Status: She is alert and oriented to person, place, and time.  Psychiatric:        Mood and Affect: Mood normal.        Behavior: Behavior normal.   Last CBC Lab Results  Component Value Date   WBC 7.5 02/25/2023   HGB 13.0 02/25/2023   HCT 38.5 02/25/2023   MCV 98 (H) 02/25/2023   MCH 33.0 02/25/2023   RDW 13.0 02/25/2023   PLT 213 02/25/2023   Last metabolic panel Lab Results  Component Value Date   GLUCOSE 93 02/25/2023   NA 140 02/25/2023   K 4.5 02/25/2023   CL 102 02/25/2023   CO2 23 02/25/2023   BUN 10 02/25/2023   CREATININE 1.04 (H) 02/25/2023   EGFR 66 02/25/2023   CALCIUM 8.9 02/25/2023   PROT 6.8 02/25/2023   ALBUMIN 4.2 02/25/2023   LABGLOB 2.6 02/25/2023   AGRATIO 1.7 07/25/2020   BILITOT 0.2 02/25/2023    ALKPHOS 87 02/25/2023   AST 13 02/25/2023   ALT 8 02/25/2023   ANIONGAP 6 04/15/2022   Last lipids Lab Results  Component Value Date   CHOL 240 (H) 04/15/2022   HDL  60 04/15/2022   LDLCALC 171 (H) 04/15/2022   TRIG 47 04/15/2022   CHOLHDL 4.0 04/15/2022   Last hemoglobin A1c Lab Results  Component Value Date   HGBA1C 5.5 04/15/2022   Last thyroid functions Lab Results  Component Value Date   TSH 24.400 (H) 03/11/2023   Last vitamin D Lab Results  Component Value Date   VD25OH 29.44 (L) 04/15/2022   The 10-year ASCVD risk score (Arnett DK, et al., 2019) is: 1.4%    Assessment & Plan:   Problem List Items Addressed This Visit       Hot flashes   Symptoms are poorly controlled.  Resume progesterone.      Postablative hypothyroidism   TSH elevated at 24.4.  Seen by endocrinology for follow-up earlier today.      Anxiety with depression   Her acute concern today is poorly controlled anxiety and depression.  She states that she does not feel like herself and finds herself clenching her toes and tightening her jaw.  She is tearful when discussing her current symptoms.  She has most recently been prescribed fluoxetine 30 mg daily and mirtazapine 15 mg daily.  She has resumed fluoxetine since returning home but has been off mirtazapine. -Increase fluoxetine 40 mg daily and resume mirtazapine 15 mg nightly. -Hydroxyzine has also been refilled for as needed anxiety relief -Follow-up in 6 weeks for reassessment      Colon cancer screening   Cologuard ordered today for colon cancer screening      Breast cancer screening   Screening mammogram ordered today      Subcutaneous nodule of right lower leg   Noted on right lower leg.  No significant erythema, tenderness to palpation, or edema appreciated.  Will order vascular ultrasound for better characterization.      Return in about 6 weeks (around 04/23/2023) for anxiety and depression.   Billie Lade, MD

## 2023-03-12 NOTE — Patient Instructions (Addendum)
It was a pleasure to see you today.  Thank you for giving [place] the opportunity to be involved in your care.  Below is a brief recap of your visit and next steps.  We will plan to see you again in 6 weeks.  Summary Increase fluoxetine to 40 mg daily Additional medications refilled Cologuard ordered Mammogram ordered Follow up in 6 weeks

## 2023-03-23 ENCOUNTER — Ambulatory Visit: Payer: Medicaid Other | Admitting: Dermatology

## 2023-03-24 ENCOUNTER — Ambulatory Visit (INDEPENDENT_AMBULATORY_CARE_PROVIDER_SITE_OTHER): Payer: Medicaid Other | Admitting: Dermatology

## 2023-03-24 DIAGNOSIS — Z7189 Other specified counseling: Secondary | ICD-10-CM

## 2023-03-24 DIAGNOSIS — L309 Dermatitis, unspecified: Secondary | ICD-10-CM

## 2023-03-24 DIAGNOSIS — Z79899 Other long term (current) drug therapy: Secondary | ICD-10-CM

## 2023-03-24 DIAGNOSIS — L732 Hidradenitis suppurativa: Secondary | ICD-10-CM | POA: Diagnosis not present

## 2023-03-24 NOTE — Patient Instructions (Signed)

## 2023-03-24 NOTE — Progress Notes (Signed)
 Follow Up Visit   Subjective  Kimberly Rocha is a 48 y.o. female who presents for the following: Hidradenitis Suppurativa with secondary dermatitis/LSC at groin. Pending approval for Bimzelx. Patient is currently using Clindamycin gel, Opzelura cream, and Spironolactone 100 mg daily. Patient has flares in groin/vaginal area. Areas are tender, draining, and she is unable to wear clothing when at home. Other areas axilla, inframammary, and scalp. She has been on Humira and isotretinoin in the past.   The following portions of the chart were reviewed this encounter and updated as appropriate: medications, allergies, medical history  Review of Systems:  No other skin or systemic complaints except as noted in HPI or Assessment and Plan.  Objective  Well appearing patient in no apparent distress; mood and affect are within normal limits.  A focused examination was performed of the following areas:  face  Relevant exam findings are noted in the Assessment and Plan.    Assessment & Plan   HIDRADENITIS SUPPURATIVA- SEVERE, with secondary dermatitis/LSC at groin Scalp, axilla, inframammary, groin BP today 128/88  Exam: Tender cysts at right perineal groin; hyperpigmented patches with scarring at perirectal area; hyperpigmented patches with alopecia R parietal, mid crown, and occipital scalp; multiple small cysts and comedones at inframammary; cysts of the bilateral axilla.   Chronic and persistent condition with duration or expected duration over one year. Condition is bothersome/symptomatic for patient. Currently flared.   Hidradenitis Suppurativa is a chronic; persistent; non-curable, but treatable condition due to abnormal inflamed sweat glands in the body folds (axilla, inframammary, groin, medial thighs), causing recurrent painful draining cysts and scarring. It can be associated with severe scarring acne and cysts; also abscesses and scarring of scalp. The goal is control and prevention  of flares, as it is not curable. Scars are permanent and can be thickened. Treatment may include daily use of topical medication and oral antibiotics.  Oral isotretinoin may also be helpful.  For some cases, Humira or Cosentyx (biologic injections) may be prescribed to decrease the inflammatory process and prevent flares.  When indicated, inflamed cysts may also be treated surgically.  Treatment Plan: Cont Clindamycin gel qd to aa.  Cont Opzelura cr every day/bid to aa body/scalp/groin prn itching Pending approval start Bimzelx 320mg /35ml. We are waiting on insurance appeal at this time. Continue Spironolactone 100mg  1 po qd If Bimzelx not approved, will start Cosentyx.    Reviewed risks of biologics including immunosuppression, infections, injection site reaction, and failure to improve condition. Goal is control of skin condition, not cure.  Some older biologics such as Humira and Enbrel may slightly increase risk of malignancy and may worsen congestive heart failure.  Taltz and Cosentyx may cause inflammatory bowel disease to flare. The use of biologics requires long term medication management, including periodic office visits and monitoring of blood work.   Spironolactone can cause increased urination and cause blood pressure to decrease. Please watch for signs of lightheadedness and be cautious when changing position. It can sometimes cause breast tenderness or an irregular period in premenopausal women. It can also increase potassium. The increase in potassium usually is not a concern unless you are taking other medicines that also increase potassium, so please be sure your doctor knows all of the other medications you are taking. This medication should not be taken by pregnant women.  This medicine should also not be taken together with sulfa drugs like Bactrim (trimethoprim/sulfamethexazole).   Long term medication management.  Patient is using long term (months to  years) prescription medication   to control their dermatologic condition.  These medications require periodic monitoring to evaluate for efficacy and side effects and may require periodic laboratory monitoring.   Return in about 1 month (around 04/21/2023) for HS.  ICherlyn Labella, CMA, am acting as scribe for Willeen Niece, MD .   Documentation: I have reviewed the above documentation for accuracy and completeness, and I agree with the above.  Willeen Niece, MD

## 2023-03-25 LAB — COLOGUARD

## 2023-03-29 DIAGNOSIS — R232 Flushing: Secondary | ICD-10-CM | POA: Insufficient documentation

## 2023-03-29 DIAGNOSIS — R2241 Localized swelling, mass and lump, right lower limb: Secondary | ICD-10-CM | POA: Insufficient documentation

## 2023-03-29 NOTE — Assessment & Plan Note (Addendum)
 Her acute concern today is poorly controlled anxiety and depression.  She states that she does not feel like herself and finds herself clenching her toes and tightening her jaw.  She is tearful when discussing her current symptoms.  She has most recently been prescribed fluoxetine 30 mg daily and mirtazapine 15 mg daily.  She has resumed fluoxetine since returning home but has been off mirtazapine. -Increase fluoxetine 40 mg daily and resume mirtazapine 15 mg nightly. -Hydroxyzine has also been refilled for as needed anxiety relief -Follow-up in 6 weeks for reassessment

## 2023-03-29 NOTE — Assessment & Plan Note (Addendum)
 Symptoms are poorly controlled.  Resume progesterone.

## 2023-03-29 NOTE — Assessment & Plan Note (Signed)
 Cologuard ordered today for colon cancer screening

## 2023-03-29 NOTE — Assessment & Plan Note (Signed)
 Screening mammogram ordered today

## 2023-03-29 NOTE — Assessment & Plan Note (Signed)
 TSH elevated at 24.4.  Seen by endocrinology for follow-up earlier today.

## 2023-03-29 NOTE — Assessment & Plan Note (Signed)
 Noted on right lower leg.  No significant erythema, tenderness to palpation, or edema appreciated.  Will order vascular ultrasound for better characterization.

## 2023-04-05 LAB — COLOGUARD: COLOGUARD: NEGATIVE

## 2023-04-06 ENCOUNTER — Encounter: Payer: Self-pay | Admitting: Dermatology

## 2023-04-06 ENCOUNTER — Encounter: Payer: Self-pay | Admitting: Internal Medicine

## 2023-04-06 ENCOUNTER — Other Ambulatory Visit: Payer: Self-pay

## 2023-04-06 DIAGNOSIS — Z01419 Encounter for gynecological examination (general) (routine) without abnormal findings: Secondary | ICD-10-CM

## 2023-04-06 NOTE — Telephone Encounter (Signed)
 Referral placed.

## 2023-04-07 ENCOUNTER — Other Ambulatory Visit: Payer: Self-pay

## 2023-04-07 MED ORDER — MINOCYCLINE HCL 100 MG PO CAPS: 100.0000 mg | ORAL_CAPSULE | Freq: Two times a day (BID) | ORAL | 0 refills | Status: AC

## 2023-04-07 NOTE — Progress Notes (Signed)
 Minocycline 100mg  1 po bid for 2 wks.

## 2023-04-13 ENCOUNTER — Ambulatory Visit: Payer: Medicaid Other | Admitting: Primary Care

## 2023-04-14 ENCOUNTER — Ambulatory Visit: Payer: Medicaid Other | Admitting: Dermatology

## 2023-04-15 ENCOUNTER — Ambulatory Visit (INDEPENDENT_AMBULATORY_CARE_PROVIDER_SITE_OTHER): Admitting: Dermatology

## 2023-04-15 DIAGNOSIS — L280 Lichen simplex chronicus: Secondary | ICD-10-CM | POA: Diagnosis not present

## 2023-04-15 DIAGNOSIS — L732 Hidradenitis suppurativa: Secondary | ICD-10-CM | POA: Diagnosis not present

## 2023-04-15 DIAGNOSIS — Z79899 Other long term (current) drug therapy: Secondary | ICD-10-CM | POA: Diagnosis not present

## 2023-04-15 DIAGNOSIS — L209 Atopic dermatitis, unspecified: Secondary | ICD-10-CM | POA: Diagnosis not present

## 2023-04-15 DIAGNOSIS — Z7189 Other specified counseling: Secondary | ICD-10-CM

## 2023-04-15 MED ORDER — BIMEKIZUMAB-BKZX 160 MG/ML ~~LOC~~ SOAJ
160.0000 mg | SUBCUTANEOUS | Status: DC
Start: 2023-04-15 — End: 2023-04-15

## 2023-04-15 MED ORDER — BIMEKIZUMAB-BKZX 320 MG/2ML ~~LOC~~ SOAJ
320.0000 mg | SUBCUTANEOUS | Status: AC
  Administered 2023-04-15 – 2023-05-20 (×3): 320 mg via SUBCUTANEOUS

## 2023-04-15 NOTE — Patient Instructions (Signed)

## 2023-04-15 NOTE — Progress Notes (Signed)
 Follow Up Visit   Subjective  Kimberly Rocha is a 48 y.o. female who presents for the following: Hidradenitis Suppurativa with secondary dermatitis/LSC at groin.  Patient is currently using Clindamycin gel, Opzelura cream, and Spironolactone 100 mg daily. She has been approved for Bimzelx injections to start today. Patient had severe flare recently with swelling and drainage (painful to sit and walk) and had to do salt soaks to help relieve the pain. She feels like areas have gone down some.  The following portions of the chart were reviewed this encounter and updated as appropriate: medications, allergies, medical history  Review of Systems:  No other skin or systemic complaints except as noted in HPI or Assessment and Plan.  Objective  Well appearing patient in no apparent distress; mood and affect are within normal limits.  A focused examination was performed of the following areas:  Groin, scalp  Relevant exam findings are noted in the Assessment and Plan.    Assessment & Plan   HIDRADENITIS SUPPURATIVA   Related Medications clindamycin (CLINDAGEL) 1 % gel Apply topically daily. Qd to aa rash groin, under breast, axilla spironolactone (ALDACTONE) 100 MG tablet TAKE 1 TABLET BY MOUTH DAILY bimekizumab-bkzx (BIMZELX) 160 MG/ML pen Inject 2 mLs (320 mg total) into the skin as directed. Loading doses. Inject 2 pens (320mg  total) SQ Q2W starting at week 0,2,4,6,8,10,12,14, and 16. bimekizumab-bkzx (BIMZELX) pen 320 mg   HIDRADENITIS SUPPURATIVA- SEVERE, with secondary dermatitis/LSC at groin Scalp, axilla, inframammary, groin Exam: Firm nodules and scarring of the right perivular and perianal area; hyperpigmented patches at labia.   Chronic and persistent condition with duration or expected duration over one year. Condition is bothersome/symptomatic for patient. Currently flared.   Hidradenitis Suppurativa is a chronic; persistent; non-curable, but treatable condition due to  abnormal inflamed sweat glands in the body folds (axilla, inframammary, groin, medial thighs), causing recurrent painful draining cysts and scarring. It can be associated with severe scarring acne and cysts; also abscesses and scarring of scalp. The goal is control and prevention of flares, as it is not curable. Scars are permanent and can be thickened. Treatment may include daily use of topical medication and oral antibiotics.  Oral isotretinoin may also be helpful.  For some cases, Humira or Cosentyx (biologic injections) may be prescribed to decrease the inflammatory process and prevent flares.  When indicated, inflamed cysts may also be treated surgically.  Treatment Plan: Start Bimzelx 320 mg (160 MG/ML pen x 2) SQ Q2W starting at week 0, 2, 4, 6, 8, 10, 12, 14, and 16.   (Sample) Bimzelx 160 MG/ML pen x 2 injected today in to the left and right abdomen. Patient tolerated well.  [facility] [id number] Lot [id number] Exp 09/17/2023  Patient prefers to come into office for injections.  Cont Clindamycin gel qd to aa.  Cont Opzelura cr every day/bid to aa body/scalp/groin prn itching Continue Spironolactone 100mg  1 po qd   Reviewed risks of biologics including immunosuppression, infections, injection site reaction, and failure to improve condition. Goal is control of skin condition, not cure.  Some older biologics such as Humira and Enbrel may slightly increase risk of malignancy and may worsen congestive heart failure.  Taltz and Cosentyx may cause inflammatory bowel disease to flare. The use of biologics requires long term medication management, including periodic office visits and monitoring of blood work. Discussed increase risk yeast infections with Bimzlex.   Spironolactone can cause increased urination and cause blood pressure to decrease. Please watch for signs of  lightheadedness and be cautious when changing position. It can sometimes cause breast tenderness or an irregular period in premenopausal  women. It can also increase potassium. The increase in potassium usually is not a concern unless you are taking other medicines that also increase potassium, so please be sure your doctor knows all of the other medications you are taking. This medication should not be taken by pregnant women.  This medicine should also not be taken together with sulfa drugs like Bactrim (trimethoprim/sulfamethexazole).   Long term medication management.  Patient is using long term (months to years) prescription medication  to control their dermatologic condition.  These medications require periodic monitoring to evaluate for efficacy and side effects and may require periodic laboratory monitoring.   Return every 2 weeks with nurse for Bimzelx injections. Will see Dr Roseanne Reno at week 16, for HS.  ICherlyn Labella, CMA, am acting as scribe for Willeen Niece, MD .   Documentation: I have reviewed the above documentation for accuracy and completeness, and I agree with the above.  Willeen Niece, MD

## 2023-04-21 ENCOUNTER — Ambulatory Visit (HOSPITAL_COMMUNITY): Payer: Medicaid Other

## 2023-04-23 ENCOUNTER — Ambulatory Visit (HOSPITAL_COMMUNITY)

## 2023-04-29 ENCOUNTER — Ambulatory Visit: Admitting: Advanced Practice Midwife

## 2023-04-30 ENCOUNTER — Ambulatory Visit: Payer: Medicaid Other | Admitting: Internal Medicine

## 2023-05-06 ENCOUNTER — Encounter: Payer: Self-pay | Admitting: Internal Medicine

## 2023-05-06 ENCOUNTER — Ambulatory Visit

## 2023-05-06 ENCOUNTER — Ambulatory Visit (INDEPENDENT_AMBULATORY_CARE_PROVIDER_SITE_OTHER)

## 2023-05-06 DIAGNOSIS — L732 Hidradenitis suppurativa: Secondary | ICD-10-CM

## 2023-05-06 NOTE — Progress Notes (Signed)
 Patient here today for week 2 of loading dose of Bimzelx for hidradenitis suppurativa. Patient is behind on therapy.   Bimzelx 320mg  (160mg /mL X2) injected into L and R arm. Patient tolerated well.  LOT: [id number] EXP: 09/16/24  We have scheduled a two week nurse visit and the 16 week follow up with Dr. Roseanne Reno. aw  Dorathy Daft, RMA

## 2023-05-18 ENCOUNTER — Encounter: Payer: Self-pay | Admitting: Dermatology

## 2023-05-19 ENCOUNTER — Ambulatory Visit

## 2023-05-20 ENCOUNTER — Ambulatory Visit

## 2023-05-20 ENCOUNTER — Ambulatory Visit (INDEPENDENT_AMBULATORY_CARE_PROVIDER_SITE_OTHER)

## 2023-05-20 DIAGNOSIS — L732 Hidradenitis suppurativa: Secondary | ICD-10-CM | POA: Diagnosis not present

## 2023-05-20 NOTE — Progress Notes (Signed)
 Patient here today for week 4 of loading dose of Bimzelx  for hidradenitis suppurativa. Patient is behind on therapy.    Bimzelx  320mg  (160mg /mL X2) injected into L and R thigh. Patient tolerated well.   LOT: [id number] EXP: 09/16/24   We have scheduled a two week nurse visit and the 16 week follow up with Dr. Annette Barters.   Mara Seminole CMA, AAMA

## 2023-06-03 ENCOUNTER — Ambulatory Visit

## 2023-06-11 NOTE — Patient Instructions (Incomplete)

## 2023-06-15 ENCOUNTER — Ambulatory Visit: Payer: Medicaid Other | Admitting: Nurse Practitioner

## 2023-06-15 ENCOUNTER — Telehealth: Payer: Self-pay

## 2023-06-15 DIAGNOSIS — E890 Postprocedural hypothyroidism: Secondary | ICD-10-CM

## 2023-06-15 MED ORDER — FLUCONAZOLE 150 MG PO TABS: 150.0000 mg | ORAL_TABLET | Freq: Once | ORAL | 2 refills | Status: AC

## 2023-06-15 NOTE — Telephone Encounter (Signed)
 Fluconazole  RX from [employer] conversation with patient. aw

## 2023-06-17 ENCOUNTER — Ambulatory Visit

## 2023-06-24 ENCOUNTER — Ambulatory Visit: Admitting: Internal Medicine

## 2023-06-27 ENCOUNTER — Other Ambulatory Visit: Payer: Self-pay | Admitting: Internal Medicine

## 2023-06-27 DIAGNOSIS — R232 Flushing: Secondary | ICD-10-CM

## 2023-06-28 ENCOUNTER — Ambulatory Visit: Admitting: Women's Health

## 2023-07-15 ENCOUNTER — Encounter: Payer: Self-pay | Admitting: Internal Medicine

## 2023-07-16 NOTE — Telephone Encounter (Signed)
 Called patient spoke to daughter and will call back to schedule an appointment

## 2023-07-16 NOTE — Telephone Encounter (Signed)
Records at front desk.

## 2023-07-19 ENCOUNTER — Ambulatory Visit: Admitting: Women's Health

## 2023-07-21 ENCOUNTER — Inpatient Hospital Stay (HOSPITAL_COMMUNITY): Admission: RE | Admit: 2023-07-21 | Source: Ambulatory Visit

## 2023-07-30 ENCOUNTER — Other Ambulatory Visit: Payer: Self-pay | Admitting: Dermatology

## 2023-07-30 ENCOUNTER — Other Ambulatory Visit: Payer: Self-pay | Admitting: Internal Medicine

## 2023-07-30 DIAGNOSIS — F410 Panic disorder [episodic paroxysmal anxiety] without agoraphobia: Secondary | ICD-10-CM

## 2023-07-30 DIAGNOSIS — L732 Hidradenitis suppurativa: Secondary | ICD-10-CM

## 2023-08-09 ENCOUNTER — Ambulatory Visit: Admitting: Dermatology

## 2023-08-31 ENCOUNTER — Encounter (HOSPITAL_COMMUNITY): Payer: Self-pay

## 2023-09-02 ENCOUNTER — Other Ambulatory Visit (HOSPITAL_COMMUNITY): Payer: Self-pay

## 2023-09-02 DIAGNOSIS — Z1231 Encounter for screening mammogram for malignant neoplasm of breast: Secondary | ICD-10-CM

## 2023-09-06 ENCOUNTER — Encounter (HOSPITAL_COMMUNITY)

## 2023-09-06 ENCOUNTER — Ambulatory Visit: Admitting: Dermatology

## 2023-09-06 DIAGNOSIS — Z1231 Encounter for screening mammogram for malignant neoplasm of breast: Secondary | ICD-10-CM

## 2023-09-09 ENCOUNTER — Ambulatory Visit: Admitting: Nurse Practitioner

## 2023-09-10 ENCOUNTER — Ambulatory Visit (HOSPITAL_COMMUNITY)

## 2023-09-17 ENCOUNTER — Encounter: Payer: Self-pay | Admitting: Radiology

## 2023-09-29 ENCOUNTER — Ambulatory Visit (INDEPENDENT_AMBULATORY_CARE_PROVIDER_SITE_OTHER): Admitting: Dermatology

## 2023-09-29 DIAGNOSIS — L732 Hidradenitis suppurativa: Secondary | ICD-10-CM

## 2023-09-29 DIAGNOSIS — Z7189 Other specified counseling: Secondary | ICD-10-CM

## 2023-09-29 DIAGNOSIS — L309 Dermatitis, unspecified: Secondary | ICD-10-CM

## 2023-09-29 DIAGNOSIS — Z79899 Other long term (current) drug therapy: Secondary | ICD-10-CM

## 2023-09-29 MED ORDER — OPZELURA 1.5 % EX CREA: TOPICAL_CREAM | CUTANEOUS | 5 refills | Status: AC

## 2023-09-29 MED ORDER — DOXYCYCLINE HYCLATE 100 MG PO TABS
100.0000 mg | ORAL_TABLET | Freq: Two times a day (BID) | ORAL | 2 refills | Status: AC
Start: 2023-09-29 — End: 2023-12-28

## 2023-09-29 MED ORDER — BIMZELX 160 MG/ML ~~LOC~~ SOAJ: 320.0000 mg | SUBCUTANEOUS | 5 refills | Status: AC

## 2023-09-29 MED ORDER — SPIRONOLACTONE 100 MG PO TABS: 100.0000 mg | ORAL_TABLET | Freq: Every day | ORAL | 5 refills | Status: AC

## 2023-09-29 MED ORDER — CLINDAMYCIN PHOS (TWICE-DAILY) 1 % EX GEL: Freq: Two times a day (BID) | CUTANEOUS | 5 refills | Status: DC

## 2023-09-29 NOTE — Patient Instructions (Signed)

## 2023-09-29 NOTE — Progress Notes (Signed)
 Follow-Up Visit   Subjective  Kimberly Rocha is a 48 y.o. female who presents for the following: HS follow up.  She is doing well on her current treatment regimen with occasional flares.  Much improved. Takes Bimzelx - no side effects.  Also clindamycin  gel, Spironolactone  100 mg daily, and doxycycline  prn flares, and Opzelura  for itching at groin.   The following portions of the chart were reviewed this encounter and updated as appropriate: medications, allergies, medical history  Review of Systems:  No other skin or systemic complaints except as noted in HPI or Assessment and Plan.  Objective  Well appearing patient in no apparent distress; mood and affect are within normal limits.   A focused examination was performed of the following areas: Groin   Relevant exam findings are noted in the Assessment and Plan.    Assessment & Plan   HIDRADENITIS SUPPURATIVA- SEVERE, with secondary dermatitis/LSC at groin Scalp, axilla, inframammary, groin Exam: Firm nodules and scarring of the BL perivular and perianal area; hyperpigmented papules perineal groin    Chronic and persistent condition with duration or expected duration over one year. Condition is improving with treatment but not currently at goal.   Hidradenitis Suppurativa is a chronic; persistent; non-curable, but treatable condition due to abnormal inflamed sweat glands in the body folds (axilla, inframammary, groin, medial thighs), causing recurrent painful draining cysts and scarring. It can be associated with severe scarring acne and cysts; also abscesses and scarring of scalp. The goal is control and prevention of flares, as it is not curable. Scars are permanent and can be thickened. Treatment may include daily use of topical medication and oral antibiotics.  Oral isotretinoin  may also be helpful.  For some cases, Humira  or Cosentyx (biologic injections) may be prescribed to decrease the inflammatory process and prevent flares.   When indicated, inflamed cysts may also be treated surgically.   Treatment Plan: Last TB 02/25/23, labs next visit Continue Bimzelx  320 mg (160 MG/ML pen x 2) SQ monthly. Patient has completed loading dose.    Patient prefers to have medication delivered to our office but to inject herself.    Cont Clindamycin  gel qd to aa.  Cont Opzelura  cr every day/bid to aa body/scalp/groin prn itching Continue Spironolactone  100mg  1 po qd  Continue Doxycycline  100 mg PO bid x 2 weeks prn flares   Reviewed risks of biologics including immunosuppression, infections, injection site reaction, and failure to improve condition. Goal is control of skin condition, not cure.  Some older biologics such as Humira  may slightly increase risk of malignancy and may worsen congestive heart failure. Cosentyx may cause inflammatory bowel disease to flare. The use of biologics requires long term medication management, including periodic office visits and monitoring of blood work. Discussed increase risk yeast infections with Bimzlex.   Spironolactone  can cause increased urination and cause blood pressure to decrease. Please watch for signs of lightheadedness and be cautious when changing position. It can sometimes cause breast tenderness or an irregular period in premenopausal women. It can also increase potassium. The increase in potassium usually is not a concern unless you are taking other medicines that also increase potassium, so please be sure your doctor knows all of the other medications you are taking. This medication should not be taken by pregnant women.  This medicine should also not be taken together with sulfa  drugs like Bactrim  (trimethoprim /sulfamethexazole).   Doxycycline  should be taken with food to prevent nausea. Do not lay down for 30 minutes after  taking. Be cautious with sun exposure and use good sun protection while on this medication. Pregnant women should not take this medication.     Long term  medication management.  Patient is using long term (months to years) prescription medication  to control their dermatologic condition.  These medications require periodic monitoring to evaluate for efficacy and side effects and may require periodic laboratory monitoring.   DERMATITIS   Related Medications Ruxolitinib Phosphate  (OPZELURA ) 1.5 % CREA Apply to affected area qd HIDRADENITIS SUPPURATIVA   Related Medications clindamycin  (CLINDAGEL) 1 % gel Apply topically daily. Qd to aa rash groin, under breast, axilla spironolactone  (ALDACTONE ) 100 MG tablet Take 1 tablet (100 mg total) by mouth daily. bimekizumab -bkzx (BIMZELX ) 160 MG/ML pen Inject 2 mLs (320 mg total) into the skin every 28 (twenty-eight) days. MEDICATION MANAGEMENT   LONG-TERM USE OF HIGH-RISK MEDICATION   COUNSELING AND COORDINATION OF CARE    Return in about 6 months (around 03/28/2024) for HS; check labs.  I, Emerick Ege, CMA am acting as scribe for Rexene Rattler, MD.    Documentation: I have reviewed the above documentation for accuracy and completeness, and I agree with the above.  Rexene Rattler, MD

## 2023-10-05 ENCOUNTER — Ambulatory Visit: Admitting: Pulmonary Disease

## 2023-10-05 ENCOUNTER — Encounter: Payer: Self-pay | Admitting: Dermatology

## 2023-10-06 ENCOUNTER — Encounter: Payer: Self-pay | Admitting: Pulmonary Disease

## 2023-10-06 ENCOUNTER — Ambulatory Visit (INDEPENDENT_AMBULATORY_CARE_PROVIDER_SITE_OTHER): Admitting: Pulmonary Disease

## 2023-10-06 VITALS — BP 110/70 | HR 71 | Temp 98.4°F | Ht 63.0 in | Wt 127.0 lb

## 2023-10-06 DIAGNOSIS — R0683 Snoring: Secondary | ICD-10-CM

## 2023-10-06 DIAGNOSIS — R5383 Other fatigue: Secondary | ICD-10-CM

## 2023-10-06 DIAGNOSIS — R0602 Shortness of breath: Secondary | ICD-10-CM

## 2023-10-06 MED ORDER — ALBUTEROL SULFATE HFA 108 (90 BASE) MCG/ACT IN AERS: 2.0000 | INHALATION_SPRAY | Freq: Four times a day (QID) | RESPIRATORY_TRACT | 3 refills | Status: DC | PRN

## 2023-10-06 NOTE — Progress Notes (Signed)
 Synopsis: Referred in by Kimberly Manus BRAVO, MD   Subjective:   PATIENT ID: Kimberly Rocha Getting GENDER: female DOB: 05-24-1975, MRN: [id number]  Chief Complaint  Patient presents with   Shortness of Breath    Centrilobular emphysema, x-ray: 07/09/21  Has noticed in the past week or so she's getting winded easily, especially when going up stairs, but has noticed a decline in her breathing since 2022. History of multiple Covid infections starting in January of 2022. Gasping in her sleep which can wake her up, loud snoring, no prior sleep study, family history of sleep apnea in her father who used a CPAP as well. No wheezing, very occasional cough but it's dry. [place] is 20.    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  She was last seen by Dr. Alaine on 01/2022 and was being on managed for COPD at the time.  She was on Anoro for a short period of time and then switched to albuterol  on an as-needed basis which she has not required.  She reports worsening shortness of breath on exertion today.  This is associated with fatigue and exhaustion.  Last chest x-ray in 2023 some pulmonary hyperinflation.  Last TSH in February 2025 was 24.400.     Family History  Problem Relation Age of Onset   COPD Mother    COPD Father    HIV/AIDS Brother    Stroke Maternal Grandmother    Other Daughter        has issues with cervix     Social History   Socioeconomic History   Marital status: Single    Spouse name: Not on file   Number of children: Not on file   Years of education: Not on file   Highest education level: Associate degree: academic program  Occupational History   Not on file  Tobacco Use   Smoking status: Former     Current packs/day: 0.50    Types: Cigarettes   Smokeless tobacco: Never  Vaping Use   Vaping status: Former  Substance and Sexual Activity   Alcohol use: Yes    Comment: occ   Drug use: Yes    Frequency: 3.0 times per week    Types: Marijuana    Comment: twice a day   Sexual activity: Yes    Birth control/protection: Surgical    Comment: tubal  Other Topics Concern   Not on file  Social History Narrative   Not on file   Social Drivers of Health   Financial Resource Strain: Medium Risk (01/02/2023)   Overall Financial Resource Strain (CARDIA)    Difficulty of Paying Living Expenses: Somewhat hard  Food Insecurity: Food Insecurity Present (01/02/2023)   Hunger Vital Sign    Worried About Running Out of Food in the Last Year: Sometimes true    Ran Out of Food in the Last Year: Sometimes true  Transportation Needs: Unmet Transportation Needs (01/02/2023)   PRAPARE - Transportation    Lack of Transportation (Medical): Yes    Lack of Transportation (Non-Medical): Yes  Physical Activity: Sufficiently Active (01/02/2023)   Exercise Vital Sign    Days of Exercise per Week: 7 days    Minutes of Exercise per Session: 30 min  Stress: Stress Concern Present (01/02/2023)   [place]  [employer] - Occupational Stress Questionnaire    Feeling of Stress : Very much  Social Connections: Socially Isolated (01/02/2023)   Social Connection and Isolation Panel    Frequency of Communication with Friends and Family: Twice a week    Frequency of Social Gatherings with Friends and Family: Never    Attends Religious Services: 1 to 4 times per year    Active Member of [employer] or Organizations: No    Attends Engineer, structural: Not on file    Marital Status: Never married  Intimate Partner Violence: Not At Risk (09/09/2020)   Humiliation, Afraid, Rape, and Kick questionnaire    Fear of Current or Ex-Partner: No    Emotionally Abused: No    Physically Abused: No     Sexually Abused: No        Objective:   Vitals:   10/06/23 1111  BP: 110/70  Pulse: 71  Temp: 98.4 F (36.9 C)  SpO2: 97%  Weight: 127 lb (57.6 kg)  Height: 5' 3 (1.6 m)   97% on RA BMI Readings from Last 3 Encounters:  10/06/23 22.50 kg/m  03/12/23 25.70 kg/m  03/12/23 25.27 kg/m   Wt Readings from Last 3 Encounters:  10/06/23 127 lb (57.6 kg)  03/12/23 131 lb 9.6 oz (59.7 kg)  03/12/23 129 lb 6.4 oz (58.7 kg)    Physical Exam GEN: NAD, Healthy Appearing HEENT: Supple Neck, Reactive Pupils, EOMI  CVS: Normal S1, Normal S2, RRR, No murmurs or ES appreciated  Lungs: Poor air movement, no wheezing or crackles appreciated.   Abdomen: Soft, non tender, non distended, + BS  Extremities: Warm and well perfused, No edema    Ancillary Information   CBC    Component Value Date/Time   WBC 7.5 02/25/2023 0904   WBC 7.9 04/15/2022 1307   RBC 3.94 02/25/2023 0904   RBC 3.71 (L) 04/15/2022 1307   HGB 13.0 02/25/2023 0904   HCT 38.5 02/25/2023 0904   PLT 213 02/25/2023 0904   MCV 98 (H) 02/25/2023 0904   MCH 33.0 02/25/2023 0904   MCH 34.0 04/15/2022 1307   MCHC 33.8 02/25/2023 0904   MCHC 34.5 04/15/2022 1307   RDW 13.0 02/25/2023 0904   LYMPHSABS 3.3 (H) 02/25/2023 0904   MONOABS 0.6 04/15/2022 1307   EOSABS 0.0 02/25/2023 0904   BASOSABS 0.0 02/25/2023 0904   Labs and imaging were reviewed.      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  #COPD however no PFTs on file to assess stage. CTA chest in 2021 with mild upper lobe predominant centrilobular emphysema.  Currently symptomatic with worsening exertional dyspnea, exhaustion and  fatigue. Now she does have hypothyroidism and that might be a contributing factor. Last TSH was 24.400.   []  PFTs  []  A1AT []  Albuterol  As needed for now.  []  Echocardiogram to assess her cardiac function given ongoing dyspnea.  []  CXR  #Suspecting sleep apnea  She does have loud snoring and daytime fatigue.   []  HST.   Return in about 3 months (around 01/05/2024).  I personally spent a total of 60 minutes in the care of the patient today including  preparing to see the patient, getting/reviewing separately obtained history, performing a medically appropriate exam/evaluation, counseling and educating, placing orders, documenting clinical information in the EHR, and independently interpreting results.   Darrin Barn, MD [facility] 10/06/2023 2:40 PM

## 2023-10-27 ENCOUNTER — Encounter: Payer: Self-pay | Admitting: Internal Medicine

## 2023-10-27 ENCOUNTER — Encounter: Payer: Self-pay | Admitting: Nurse Practitioner

## 2023-10-28 NOTE — Telephone Encounter (Signed)
 Can we please move her New pt appointment up and she can be seen at urgent care for the acute issue at present.

## 2023-10-31 ENCOUNTER — Other Ambulatory Visit: Payer: Self-pay | Admitting: Internal Medicine

## 2023-10-31 DIAGNOSIS — R232 Flushing: Secondary | ICD-10-CM

## 2023-11-04 ENCOUNTER — Encounter: Payer: Self-pay | Admitting: Pulmonary Disease

## 2023-11-04 ENCOUNTER — Telehealth: Payer: Self-pay

## 2023-11-04 DIAGNOSIS — R0602 Shortness of breath: Secondary | ICD-10-CM

## 2023-11-04 MED ORDER — ALBUTEROL SULFATE HFA 108 (90 BASE) MCG/ACT IN AERS
2.0000 | INHALATION_SPRAY | Freq: Four times a day (QID) | RESPIRATORY_TRACT | 3 refills | Status: AC | PRN
Start: 2023-11-04 — End: ?

## 2023-11-04 NOTE — Telephone Encounter (Signed)
 [facility] called to schedule patient's Bimzelx  delivery. Will be delivered 11/08/23 between 8-5. Lonell RAMAN., RMA

## 2023-11-05 ENCOUNTER — Encounter: Payer: Self-pay | Admitting: Nurse Practitioner

## 2023-11-05 ENCOUNTER — Ambulatory Visit

## 2023-11-05 ENCOUNTER — Ambulatory Visit (INDEPENDENT_AMBULATORY_CARE_PROVIDER_SITE_OTHER): Admitting: Nurse Practitioner

## 2023-11-05 VITALS — BP 110/68 | HR 75 | Temp 98.1°F | Ht 63.0 in | Wt 128.2 lb

## 2023-11-05 DIAGNOSIS — M79644 Pain in right finger(s): Secondary | ICD-10-CM | POA: Diagnosis not present

## 2023-11-05 DIAGNOSIS — F418 Other specified anxiety disorders: Secondary | ICD-10-CM | POA: Diagnosis not present

## 2023-11-05 DIAGNOSIS — E782 Mixed hyperlipidemia: Secondary | ICD-10-CM | POA: Diagnosis not present

## 2023-11-05 DIAGNOSIS — M25562 Pain in left knee: Secondary | ICD-10-CM

## 2023-11-05 DIAGNOSIS — E890 Postprocedural hypothyroidism: Secondary | ICD-10-CM
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
## 2023-11-05 NOTE — Telephone Encounter (Signed)
 I have placed the HST order in my stack to get scheduled

## 2023-11-08 NOTE — Telephone Encounter (Signed)
 Recommend she make an office visit for further evaluation, either in our office or with urgent care

## 2023-11-10 ENCOUNTER — Encounter: Payer: Self-pay | Admitting: Dermatology

## 2023-11-10 ENCOUNTER — Encounter: Payer: Self-pay | Admitting: Nurse Practitioner

## 2023-11-10 NOTE — Telephone Encounter (Signed)
 See patient message

## 2023-11-17 ENCOUNTER — Telehealth: Payer: Self-pay

## 2023-11-17 ENCOUNTER — Other Ambulatory Visit (HOSPITAL_COMMUNITY)
Admission: RE | Admit: 2023-11-17 | Discharge: 2023-11-17 | Disposition: A | Discharge status: Home / Self Care | Source: Ambulatory Visit | Attending: Nurse Practitioner | Admitting: Nurse Practitioner

## 2023-11-17 ENCOUNTER — Other Ambulatory Visit

## 2023-11-17 DIAGNOSIS — E890 Postprocedural hypothyroidism: Secondary | ICD-10-CM | POA: Insufficient documentation

## 2023-11-17 DIAGNOSIS — E782 Mixed hyperlipidemia: Secondary | ICD-10-CM | POA: Insufficient documentation

## 2023-11-17 DIAGNOSIS — F418 Other specified anxiety disorders: Secondary | ICD-10-CM

## 2023-11-17 LAB — COMPREHENSIVE METABOLIC PANEL WITH GFR
ALT: 11 U/L (ref 0–44)
AST: 18 U/L (ref 15–41)
Albumin: 4.3 g/dL (ref 3.5–5.0)
Alkaline Phosphatase: 82 U/L (ref 38–126)
Anion gap: 8 (ref 5–15)
BUN: 10 mg/dL (ref 6–20)
CO2: 27 mmol/L (ref 22–32)
Calcium: 9 mg/dL (ref 8.9–10.3)
Chloride: 104 mmol/L (ref 98–111)
Creatinine, Ser: 0.85 mg/dL (ref 0.44–1.00)
GFR, Estimated: >60 mL/min (ref >60)
Glucose, Bld: 96 mg/dL (ref 70–99)
Potassium: 4.3 mmol/L (ref 3.5–5.1)
Sodium: 138 mmol/L (ref 135–145)
Total Bilirubin: 0.2 mg/dL (ref 0.0–1.2)
Total Protein: 7.4 g/dL (ref 6.5–8.1)

## 2023-11-17 LAB — CBC WITH DIFFERENTIAL/PLATELET
Abs Immature Granulocytes: 0.02 K/uL (ref 0.00–0.07)
Basophils Absolute: 0.1 K/uL (ref 0.0–0.1)
Basophils Relative: 1 %
Eosinophils Absolute: 0 K/uL (ref 0.0–0.5)
Eosinophils Relative: 0 %
HCT: 38.8 % (ref 36.0–46.0)
Hemoglobin: 13.4 g/dL (ref 12.0–15.0)
Immature Granulocytes: 0 %
Lymphocytes Relative: 25 %
Lymphs Abs: 2.6 K/uL (ref 0.7–4.0)
MCH: 33.8 pg (ref 26.0–34.0)
MCHC: 34.5 g/dL (ref 30.0–36.0)
MCV: 98 fL (ref 80.0–100.0)
Monocytes Absolute: 0.6 K/uL (ref 0.1–1.0)
Monocytes Relative: 6 %
Neutro Abs: 6.9 K/uL (ref 1.7–7.7)
Neutrophils Relative %: 68 %
Platelets: 239 K/uL (ref 150–400)
RBC: 3.96 MIL/uL (ref 3.87–5.11)
RDW: 14 % (ref 11.5–15.5)
WBC: 10.2 K/uL (ref 4.0–10.5)
nRBC: 0 % (ref 0.0–0.2)

## 2023-11-17 LAB — LIPID PANEL
Cholesterol: 265 mg/dL — ABNORMAL HIGH (ref 0–200)
HDL: 48 mg/dL (ref >40)
LDL Cholesterol: 199 mg/dL — ABNORMAL HIGH (ref 0–99)
Total CHOL/HDL Ratio: 5.6 ratio
Triglycerides: 93 mg/dL (ref <150)
VLDL: 19 mg/dL (ref 0–40)

## 2023-11-17 LAB — T4, FREE: Free T4: 0.6 ng/dL — ABNORMAL LOW (ref 0.61–1.12)

## 2023-11-17 LAB — TSH: TSH: 11.8 u[IU]/mL — ABNORMAL HIGH (ref 0.350–4.500)

## 2023-11-17 NOTE — Telephone Encounter (Signed)
 Labs ordered.

## 2023-11-17 NOTE — Telephone Encounter (Signed)
 Copied from CRM [id number]. Topic: Clinical - Request for Lab/Test Order >> Nov 17, 2023  8:36 AM Lonell PEDLAR wrote: Reason for CRM: Patient called requesting to have labs completed at [facility], please update lab order >> Nov 17, 2023  8:39 AM Lonell PEDLAR wrote: Correction: Patient would like to go to [facility]

## 2023-11-18 ENCOUNTER — Ambulatory Visit: Payer: Self-pay | Admitting: Nurse Practitioner

## 2023-11-18 NOTE — Progress Notes (Signed)
 Hold for OV on 11/18/23

## 2023-11-19 ENCOUNTER — Ambulatory Visit (INDEPENDENT_AMBULATORY_CARE_PROVIDER_SITE_OTHER): Admitting: Nurse Practitioner

## 2023-11-19 ENCOUNTER — Encounter: Payer: Self-pay | Admitting: Nurse Practitioner

## 2023-11-19 VITALS — BP 130/84 | HR 89 | Temp 98.1°F | Ht 63.0 in | Wt 126.0 lb

## 2023-11-19 DIAGNOSIS — E782 Mixed hyperlipidemia: Secondary | ICD-10-CM | POA: Diagnosis not present

## 2023-11-19 DIAGNOSIS — M79644 Pain in right finger(s): Secondary | ICD-10-CM

## 2023-11-19 DIAGNOSIS — F410 Panic disorder [episodic paroxysmal anxiety] without agoraphobia: Secondary | ICD-10-CM

## 2023-11-19 DIAGNOSIS — F418 Other specified anxiety disorders: Secondary | ICD-10-CM | POA: Diagnosis not present

## 2023-11-19 DIAGNOSIS — Z23 Encounter for immunization: Secondary | ICD-10-CM | POA: Diagnosis not present

## 2023-11-19 MED ORDER — HYDROXYZINE PAMOATE 50 MG PO CAPS: 50.0000 mg | ORAL_CAPSULE | Freq: Three times a day (TID) | ORAL | 0 refills | Status: DC | PRN

## 2023-11-19 MED ORDER — FLUOXETINE HCL 40 MG PO CAPS: 40.0000 mg | ORAL_CAPSULE | Freq: Every day | ORAL | 0 refills | Status: DC

## 2023-11-19 MED ORDER — MIRTAZAPINE 15 MG PO TBDP: 15.0000 mg | ORAL_TABLET | Freq: Every day | ORAL | 2 refills | Status: AC

## 2023-11-19 MED ORDER — ROSUVASTATIN CALCIUM 5 MG PO TABS: 5.0000 mg | ORAL_TABLET | Freq: Every day | ORAL | 1 refills | Status: AC

## 2023-11-19 NOTE — Progress Notes (Signed)
 Established Patient Office Visit  Subjective:  Patient ID: Kimberly Rocha, female    DOB: 1976/01/26  Age: 48 y.o. MRN: [id number]  CC:  Chief Complaint  Patient presents with   Medical Management of Chronic Issues   Discussed the use of a AI scribe software for clinical note transcription with the patient, who gave verbal consent to proceed.  HPI  Kimberly Rocha is a 48 year old female who presents for a follow-up visit regarding previous x-rays and lab results.  X-rays of the right thumb and left knee were performed, both appearing normal. Pain in these areas has improved, though some itching persists. Wall squeezing exercises have alleviated thumb pain.  Recent lab work indicates elevated cholesterol levels, with an LDL of 199 mg/dL and total cholesterol of 265 mg/dL. She has not been on cholesterol medication. Lipid Panel     Component Value Date/Time   CHOL 265 (H) 11/17/2023 1108   CHOL 233 (H) 07/25/2020 1029   TRIG 93 11/17/2023 1108   HDL 48 11/17/2023 1108   HDL 47 07/25/2020 1029   CHOLHDL 5.6 11/17/2023 1108   VLDL 19 11/17/2023 1108   LDLCALC 199 (H) 11/17/2023 1108   LDLCALC 175 (H) 07/25/2020 1029   LABVLDL 11 07/25/2020 1029     HPI   Past Medical History:  Diagnosis Date   Anxiety 08/2014   Borderline high cholesterol    Borderline hypertension    COPD (chronic obstructive pulmonary disease) (HCC)    Depression 08/2014   Emphysema of lung (HCC) 10/21   Gallstones    GERD (gastroesophageal reflux disease) 08/2014   Hyperlipidemia    Hypertension    Hyperthyroidism 09/17/2020   Seizures (HCC) 03/2020   unsure   Thyroid  disease     Past Surgical History:  Procedure Laterality Date   CESAREAN SECTION     CHOLECYSTECTOMY N/A 08/22/2012   Procedure: LAPAROSCOPIC CHOLECYSTECTOMY;  Surgeon: Oneil DELENA Budge, MD;  Location: [facility];  Service: General;  Laterality: N/A;   GALLBLADDER SURGERY     HERNIA REPAIR  1984-1985   TRIGGER FINGER RELEASE  Left 02/24/2022   Procedure: RELEASE TRIGGER FINGER/A-1 PULLEY left thumb;  Surgeon: Margrette Taft BRAVO, MD;  Location: [facility];  Service: Orthopedics;  Laterality: Left;   TUBAL LIGATION      Family History  Problem Relation Age of Onset   COPD Mother    Hypertension Mother    COPD Father    Asthma Father    Diabetes Father    Heart disease Father    HIV/AIDS Brother    Stroke Maternal Grandmother    Hypertension Maternal Grandmother    Other Daughter        has issues with cervix    Social History   Socioeconomic History   Marital status: Single    Spouse name: Not on file   Number of children: Not on file   Years of education: Not on file   Highest education level: Never attended school  Occupational History   Not on file  Tobacco Use   Smoking status: Former    Current packs/day: 0.00    Average packs/day: 0.5 packs/day for 10.0 years (5.0 ttl pk-yrs)    Types: Cigarettes    Quit date: 11/26/2019    Years since quitting: 4.0   Smokeless tobacco: Never   Tobacco comments:    no longer smoke cigarettes  Vaping Use   Vaping status: Former  Substance and Sexual Activity  Alcohol use: Yes    Alcohol/week: 2.0 standard drinks of alcohol    Types: 2 Glasses of wine per week    Comment: occ   Drug use: Yes    Frequency: 4.0 times per week    Types: Marijuana    Comment: twice a day   Sexual activity: Yes    Birth control/protection: Surgical    Comment: Tubal ligation  Other Topics Concern   Not on file  Social History Narrative   Single not working.   Lives with kids.   Social Drivers of Health   Financial Resource Strain: Medium Risk (11/04/2023)   Overall Financial Resource Strain (CARDIA)    Difficulty of Paying Living Expenses: Somewhat hard  Food Insecurity: Food Insecurity Present (11/04/2023)   Hunger Vital Sign    Worried About Running Out of Food in the Last Year: Sometimes true    Ran Out of Food in the Last Year: Sometimes true   Transportation Needs: Unmet Transportation Needs (11/04/2023)   PRAPARE - Administrator, Civil Service (Medical): Yes    Lack of Transportation (Non-Medical): Yes  Physical Activity: Insufficiently Active (11/04/2023)   Exercise Vital Sign    Days of Exercise per Week: 7 days    Minutes of Exercise per Session: 20 min  Stress: Stress Concern Present (11/04/2023)   [employer] of Occupational Health - Occupational Stress Questionnaire    Feeling of Stress: Very much  Social Connections: Moderately Isolated (11/04/2023)   Social Connection and Isolation Panel    Frequency of Communication with Friends and Family: Three times a week    Frequency of Social Gatherings with Friends and Family: Once a week    Attends Religious Services: More than 4 times per year    Active Member of [employer] or Organizations: No    Attends Engineer, Structural: Not on file    Marital Status: Never married  Intimate Partner Violence: Not At Risk (09/09/2020)   Humiliation, Afraid, Rape, and Kick questionnaire    Fear of Current or Ex-Partner: No    Emotionally Abused: No    Physically Abused: No    Sexually Abused: No     Outpatient Medications Prior to Visit  Medication Sig Dispense Refill   albuterol  (VENTOLIN  HFA) 108 (90 Base) MCG/ACT inhaler Inhale 2 puffs into the lungs every 6 (six) hours as needed for wheezing or shortness of breath. 1 each 3   bimekizumab -bkzx (BIMZELX ) 160 MG/ML pen Inject 2 mLs (320 mg total) into the skin every 28 (twenty-eight) days. 2 mL 5   clindamycin  (CLINDAGEL) 1 % gel Apply topically daily. Qd to aa rash groin, under breast, axilla 60 g 6   doxycycline  (VIBRA -TABS) 100 MG tablet Take 1 tablet (100 mg total) by mouth 2 (two) times daily. 60 tablet 2   levothyroxine  (SYNTHROID ) 137 MCG tablet Take 0.5 tablets (68.5 mcg total) by mouth daily before breakfast. 45 tablet 3   nystatin  (MYCOSTATIN ) 100000 UNIT/ML suspension TAKE 5 MLS (500,000 UNITS TOTAL)  BY MOUTH AS DIRECTED. SWISH 5 ML AS LONG AS POSSIBLE AND SPIT OUT 3-4 TIMES DAILY 60 mL 2   progesterone  (PROMETRIUM ) 100 MG capsule TAKE 1 CAPSULE BY MOUTH AT BEDTIME 30 capsule 2   Ruxolitinib Phosphate  (OPZELURA ) 1.5 % CREA Apply to affected area qd 60 g 5   spironolactone  (ALDACTONE ) 100 MG tablet Take 1 tablet (100 mg total) by mouth daily. 30 tablet 5   FLUoxetine  (PROZAC ) 40 MG capsule Take 1 capsule (  40 mg total) by mouth daily. 90 capsule 3   hydrOXYzine  (VISTARIL ) 50 MG capsule Take 1 capsule (50 mg total) by mouth 3 (three) times daily as needed for anxiety or itching. 90 capsule 3   mirtazapine  (REMERON  SOL-TAB) 15 MG disintegrating tablet PLACE 1 TABLET BY MOUTH AT BEDTIME 30 tablet 2   No facility-administered medications prior to visit.    Allergies  Allergen Reactions   Demerol Hives and Itching    ROS Review of Systems Negative unless indicated in HPI.    Objective:    Physical Exam  BP 130/84   Pulse 89   Temp 98.1 F (36.7 C)   Ht 5' 3 (1.6 m)   Wt 126 lb (57.2 kg)   SpO2 97%   BMI 22.32 kg/m  Wt Readings from Last 3 Encounters:  11/19/23 126 lb (57.2 kg)  11/05/23 128 lb 3.2 oz (58.2 kg)  10/06/23 127 lb (57.6 kg)     Health Maintenance  Topic Date Due   Hepatitis B Vaccines 19-59 Average Risk (1 of 3 - 19+ 3-dose series) Never done   Colonoscopy  Never done   Mammogram  10/21/2021   COVID-19 Vaccine (4 - 2025-26 season) 09/27/2023   Cervical Cancer Screening (HPV/Pap Cotest)  09/09/2025   DTaP/Tdap/Td (7 - Td or Tdap) 09/19/2031   Pneumococcal Vaccine  Completed   Influenza Vaccine  Completed   Hepatitis C Screening  Completed   HIV Screening  Completed   HPV VACCINES  Aged Out   Meningococcal B Vaccine  Aged Out       Topic Date Due   Hepatitis B Vaccines 19-59 Average Risk (1 of 3 - 19+ 3-dose series) Never done    Lab Results  Component Value Date   TSH 11.800 (H) 11/17/2023   Lab Results  Component Value Date   WBC 10.2  11/17/2023   HGB 13.4 11/17/2023   HCT 38.8 11/17/2023   MCV 98.0 11/17/2023   PLT 239 11/17/2023   Lab Results  Component Value Date   NA 138 11/17/2023   K 4.3 11/17/2023   CO2 27 11/17/2023   GLUCOSE 96 11/17/2023   BUN 10 11/17/2023   CREATININE 0.85 11/17/2023   BILITOT 0.2 11/17/2023   ALKPHOS 82 11/17/2023   AST 18 11/17/2023   ALT 11 11/17/2023   PROT 7.4 11/17/2023   ALBUMIN 4.3 11/17/2023   CALCIUM  9.0 11/17/2023   ANIONGAP 8 11/17/2023   EGFR 66 02/25/2023   Lab Results  Component Value Date   CHOL 265 (H) 11/17/2023   Lab Results  Component Value Date   HDL 48 11/17/2023   Lab Results  Component Value Date   LDLCALC 199 (H) 11/17/2023   Lab Results  Component Value Date   TRIG 93 11/17/2023   Lab Results  Component Value Date   CHOLHDL 5.6 11/17/2023   Lab Results  Component Value Date   HGBA1C 5.5 04/15/2022      Assessment & Plan:  Need for influenza vaccination -     Flu vaccine trivalent PF, 6mos and older(Flulaval,Afluria,Fluarix,Fluzone)  Anxiety with depression Assessment & Plan: Managed with fluoxetine , hydroxyzine , and Remeron . Referred to psychiatry for further evaluation. - Refill fluoxetine  40 mg. - Refill hydroxyzine  50 mg. - Continue Remeron  15 mg at night.  Orders: -     FLUoxetine  HCl; Take 1 capsule (40 mg total) by mouth daily.  Dispense: 90 capsule; Refill: 0 -     hydrOXYzine  Pamoate; Take 1 capsule (  50 mg total) by mouth 3 (three) times daily as needed for anxiety or itching.  Dispense: 90 capsule; Refill: 0  Panic disorder -     Mirtazapine ; Take 1 tablet (15 mg total) by mouth at bedtime.  Dispense: 30 tablet; Refill: 2  Mixed hyperlipidemia Assessment & Plan: Elevated LDL and total cholesterol. Discussed risks of untreated hyperlipidemia and potential side effects of medication. Agreed to start treatment. - Initiate rosuvastatin  5 mg daily at bedtime. - Monitor for side effects such as leg cramps and elevated  liver enzymes. - Advise dietary modifications to reduce cholesterol intake, including reducing fast food and refined foods, and increasing fruits and vegetables. - Encourage regular exercise, at least 20 minutes a day, five days a week. - Request MyChart message in two weeks to assess tolerance to medication.   Pain of right thumb Assessment & Plan: No acute findings on x-rays. Pain improved with exercises.   Other orders -     Rosuvastatin  Calcium ; Take 1 tablet (5 mg total) by mouth daily.  Dispense: 90 tablet; Refill: 1    Follow-up: Return in about 4 months (around 03/21/2024) for chronic management.   [place], NP

## 2023-11-22 NOTE — Assessment & Plan Note (Signed)
 Right thumb pain with tingling. - Order x-ray of right thumb to rule out fracture. - Consider referral to orthopedics if x-ray is inconclusive.

## 2023-11-22 NOTE — Assessment & Plan Note (Signed)
 Will check lipid panel
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
## 2023-11-22 NOTE — Assessment & Plan Note (Signed)
 Stable on medication. -Followed by endocrinology.

## 2023-11-22 NOTE — Assessment & Plan Note (Signed)
 Depression and anxiety managed with medications. Interested in further support from a therapist or psychiatrist. - Send referral to psychiatrist for evaluation and management.

## 2023-11-26 NOTE — Assessment & Plan Note (Signed)
 Managed with fluoxetine , hydroxyzine , and Remeron . Referred to psychiatry for further evaluation. - Refill fluoxetine  40 mg. - Refill hydroxyzine  50 mg. - Continue Remeron  15 mg at night.

## 2023-11-26 NOTE — Assessment & Plan Note (Signed)
 No acute findings on x-rays. Pain improved with exercises.

## 2023-11-26 NOTE — Assessment & Plan Note (Signed)
 Elevated LDL and total cholesterol. Discussed risks of untreated hyperlipidemia and potential side effects of medication. Agreed to start treatment. - Initiate rosuvastatin  5 mg daily at bedtime. - Monitor for side effects such as leg cramps and elevated liver enzymes. - Advise dietary modifications to reduce cholesterol intake, including reducing fast food and refined foods, and increasing fruits and vegetables. - Encourage regular exercise, at least 20 minutes a day, five days a week. - Request MyChart message in two weeks to assess tolerance to medication.

## 2023-11-29 ENCOUNTER — Encounter: Payer: Self-pay | Admitting: Nurse Practitioner

## 2023-11-29 ENCOUNTER — Encounter: Payer: Self-pay | Admitting: Radiology

## 2023-11-29 ENCOUNTER — Ambulatory Visit: Admitting: Nurse Practitioner

## 2023-11-29 VITALS — BP 98/62 | HR 83 | Ht 63.0 in | Wt 126.0 lb

## 2023-11-29 DIAGNOSIS — E890 Postprocedural hypothyroidism: Secondary | ICD-10-CM | POA: Diagnosis not present

## 2023-11-29 MED ORDER — LEVOTHYROXINE SODIUM 137 MCG PO TABS: 62.5000 ug | ORAL_TABLET | Freq: Every day | ORAL | 3 refills | Status: AC

## 2023-11-29 NOTE — Patient Instructions (Signed)
 Make sure to tell your eye doctor that you had radioactive iodine ablation for Graves Disease so they can assess for thyroid  eye disease.

## 2023-11-29 NOTE — Progress Notes (Signed)
 11/29/2023     Endocrinology Follow Up Note     Subjective:    Patient ID: Kimberly Rocha, female    DOB: 1975-08-12, PCP Vincente Saber, NP.   Past Medical History:  Diagnosis Date   Anxiety 08/2014   Borderline high cholesterol    Borderline hypertension    COPD (chronic obstructive pulmonary disease) (HCC)    Depression 08/2014   Emphysema of lung (HCC) 10/21   Gallstones    GERD (gastroesophageal reflux disease) 08/2014   Hyperlipidemia    Hypertension    Hyperthyroidism 09/17/2020   Seizures (HCC) 03/2020   unsure   Thyroid  disease     Past Surgical History:  Procedure Laterality Date   CESAREAN SECTION     CHOLECYSTECTOMY N/A 08/22/2012   Procedure: LAPAROSCOPIC CHOLECYSTECTOMY;  Surgeon: Oneil DELENA Budge, MD;  Location: [facility];  Service: General;  Laterality: N/A;   GALLBLADDER SURGERY     HERNIA REPAIR  1984-1985   TRIGGER FINGER RELEASE Left 02/24/2022   Procedure: RELEASE TRIGGER FINGER/A-1 PULLEY left thumb;  Surgeon: Margrette Taft BRAVO, MD;  Location: [facility];  Service: Orthopedics;  Laterality: Left;   TUBAL LIGATION      Social History   Socioeconomic History   Marital status: Single    Spouse name: Not on file   Number of children: Not on file   Years of education: Not on file   Highest education level: Never attended school  Occupational History   Not on file  Tobacco Use   Smoking status: Former    Current packs/day: 0.00    Average packs/day: 0.5 packs/day for 10.0 years (5.0 ttl pk-yrs)    Types: Cigarettes    Quit date: 11/26/2019    Years since quitting: 4.0   Smokeless tobacco: Never   Tobacco comments:    no longer smoke cigarettes  Vaping Use   Vaping status: Former  Substance and Sexual Activity   Alcohol use: Yes    Alcohol/week: 2.0 standard drinks of alcohol    Types: 2 Glasses of wine per week    Comment: occ   Drug use: Yes    Frequency: 4.0 times per week    Types: Marijuana    Comment: twice a day    Sexual activity: Yes    Birth control/protection: Surgical    Comment: Tubal ligation  Other Topics Concern   Not on file  Social History Narrative   Single not working.   Lives with kids.   Social Drivers of Health   Financial Resource Strain: Medium Risk (11/04/2023)   Overall Financial Resource Strain (CARDIA)    Difficulty of Paying Living Expenses: Somewhat hard  Food Insecurity: Food Insecurity Present (11/04/2023)   Hunger Vital Sign    Worried About Running Out of Food in the Last Year: Sometimes true    Ran Out of Food in the Last Year: Sometimes true  Transportation Needs: Unmet Transportation Needs (11/04/2023)   PRAPARE - Administrator, Civil Service (Medical): Yes    Lack of Transportation (Non-Medical): Yes  Physical Activity: Insufficiently Active (11/04/2023)   Exercise Vital Sign    Days of Exercise per Week: 7 days    Minutes of Exercise per Session: 20 min  Stress: Stress Concern Present (11/04/2023)   [employer] of Occupational Health - Occupational Stress Questionnaire    Feeling of Stress: Very much  Social Connections: Moderately Isolated (11/04/2023)   Social Connection and Isolation Panel  Frequency of Communication with Friends and Family: Three times a week    Frequency of Social Gatherings with Friends and Family: Once a week    Attends Religious Services: More than 4 times per year    Active Member of [employer] or Organizations: No    Attends Engineer, Structural: Not on file    Marital Status: Never married    Family History  Problem Relation Age of Onset   COPD Mother    Hypertension Mother    COPD Father    Asthma Father    Diabetes Father    Heart disease Father    HIV/AIDS Brother    Stroke Maternal Grandmother    Hypertension Maternal Grandmother    Other Daughter        has issues with cervix    Outpatient Encounter Medications as of 11/29/2023  Medication Sig   albuterol  (VENTOLIN  HFA) 108 (90 Base)  MCG/ACT inhaler Inhale 2 puffs into the lungs every 6 (six) hours as needed for wheezing or shortness of breath.   bimekizumab -bkzx (BIMZELX ) 160 MG/ML pen Inject 2 mLs (320 mg total) into the skin every 28 (twenty-eight) days.   clindamycin  (CLINDAGEL) 1 % gel Apply topically daily. Qd to aa rash groin, under breast, axilla   doxycycline  (VIBRA -TABS) 100 MG tablet Take 1 tablet (100 mg total) by mouth 2 (two) times daily. (Patient taking differently: Take 100 mg by mouth 2 (two) times daily. As needed)   FLUoxetine  (PROZAC ) 40 MG capsule Take 1 capsule (40 mg total) by mouth daily.   hydrOXYzine  (VISTARIL ) 50 MG capsule Take 1 capsule (50 mg total) by mouth 3 (three) times daily as needed for anxiety or itching.   mirtazapine  (REMERON  SOL-TAB) 15 MG disintegrating tablet Take 1 tablet (15 mg total) by mouth at bedtime.   nystatin  (MYCOSTATIN ) 100000 UNIT/ML suspension TAKE 5 MLS (500,000 UNITS TOTAL) BY MOUTH AS DIRECTED. SWISH 5 ML AS LONG AS POSSIBLE AND SPIT OUT 3-4 TIMES DAILY   progesterone  (PROMETRIUM ) 100 MG capsule TAKE 1 CAPSULE BY MOUTH AT BEDTIME   rosuvastatin  (CRESTOR ) 5 MG tablet Take 1 tablet (5 mg total) by mouth daily.   Ruxolitinib Phosphate  (OPZELURA ) 1.5 % CREA Apply to affected area qd   spironolactone  (ALDACTONE ) 100 MG tablet Take 1 tablet (100 mg total) by mouth daily.   [DISCONTINUED] levothyroxine  (SYNTHROID ) 137 MCG tablet Take 0.5 tablets (68.5 mcg total) by mouth daily before breakfast. (Patient taking differently: Take 62.5 mcg by mouth.)   levothyroxine  (SYNTHROID ) 137 MCG tablet Take 0.5 tablets (68.5 mcg total) by mouth daily before breakfast.   No facility-administered encounter medications on file as of 11/29/2023.    ALLERGIES: Allergies  Allergen Reactions   Demerol Hives and Itching    VACCINATION STATUS: Immunization History  Administered Date(s) Administered   Dtap, Unspecified 01/04/1975, 03/06/1975, 05/01/1975, 03/29/1979   Influenza, Seasonal,  Injecte, Preservative Fre 11/19/2023   Influenza,inj,Quad PF,6+ Mos 09/18/2021   MMR 01/15/1976   Moderna Sars-Covid-2 Vaccination 02/27/2020, 03/23/2020, 03/27/2020, 05/05/2020   PNEUMOCOCCAL CONJUGATE-20 10/30/2021, 01/29/2022   Polio, Unspecified 01/04/1975, 03/06/1975, 05/01/1975, 03/29/1979   Tdap 08/14/2010, 09/18/2021     HPI  Ladon A Maltz is 48 y.o. female who presents today with a medical history as above. she is being seen in follow up after being seen in consultation for hyperthyroidism requested by Vincente Saber, NP.    She has since stopped the Methimazole  and had RAI ablation on 12/13/20.  Treatment was successful and she has now  been started on thyroid  hormone replacement.  she denies dysphagia, choking, no recent voice change.    she denies family history of thyroid  dysfunction and denies family hx of thyroid  cancer. she denies personal history of goiter. she is not on any thyroid  hormone supplements, has been taking antithyroid medication for approximately 30 days. Denies use of Biotin containing supplements.  she is willing to proceed with appropriate work up and therapy for thyrotoxicosis.   Review of systems  Constitutional: + Minimally fluctuating body weight,  current Body mass index is 22.32 kg/m. , + fatigue, no subjective hyperthermia, no subjective hypothermia Eyes: + blurry vision, no xerophthalmia ENT: no sore throat, no nodules palpated in throat, no dysphagia/odynophagia, no hoarseness Cardiovascular: no chest pain, no shortness of breath, no palpitations, no leg swelling Respiratory: no cough, no shortness of breath Gastrointestinal: no nausea/vomiting/diarrhea, + constipation Musculoskeletal: no muscle/joint aches Skin: no rashes, no hyperemia Neurological: no tremors, no numbness, no tingling, no dizziness Psychiatric: no depression, no anxiety   Objective:    BP 98/62 (BP Location: Left Arm, Patient Position: Sitting, Cuff Size: Large)    Pulse 83   Ht 5' 3 (1.6 m)   Wt 126 lb (57.2 kg)   BMI 22.32 kg/m   Wt Readings from Last 3 Encounters:  11/29/23 126 lb (57.2 kg)  11/19/23 126 lb (57.2 kg)  11/05/23 128 lb 3.2 oz (58.2 kg)     BP Readings from Last 3 Encounters:  11/29/23 98/62  11/19/23 130/84  11/05/23 110/68                        Physical Exam- Limited  Constitutional:  Body mass index is 22.32 kg/m. , not in acute distress, normal state of mind Eyes:  EOMI, no exophthalmos Musculoskeletal: no gross deformities, strength intact in all four extremities, no gross restriction of joint movements Skin:  no rashes, no hyperemia Neurological: no tremor with outstretched hands   CMP     Component Value Date/Time   NA 138 11/17/2023 1108   NA 140 02/25/2023 0904   K 4.3 11/17/2023 1108   CL 104 11/17/2023 1108   CO2 27 11/17/2023 1108   GLUCOSE 96 11/17/2023 1108   BUN 10 11/17/2023 1108   BUN 10 02/25/2023 0904   CREATININE 0.85 11/17/2023 1108   CREATININE 1.04 (H) 07/09/2021 1057   CALCIUM  9.0 11/17/2023 1108   PROT 7.4 11/17/2023 1108   PROT 6.8 02/25/2023 0904   ALBUMIN 4.3 11/17/2023 1108   ALBUMIN 4.2 02/25/2023 0904   AST 18 11/17/2023 1108   ALT 11 11/17/2023 1108   ALKPHOS 82 11/17/2023 1108   BILITOT 0.2 11/17/2023 1108   BILITOT 0.2 02/25/2023 0904   GFRNONAA >60 11/17/2023 1108   GFRAA >60 10/17/2018 1545     CBC    Component Value Date/Time   WBC 10.2 11/17/2023 1108   RBC 3.96 11/17/2023 1108   HGB 13.4 11/17/2023 1108   HGB 13.0 02/25/2023 0904   HCT 38.8 11/17/2023 1108   HCT 38.5 02/25/2023 0904   PLT 239 11/17/2023 1108   PLT 213 02/25/2023 0904   MCV 98.0 11/17/2023 1108   MCV 98 (H) 02/25/2023 0904   MCH 33.8 11/17/2023 1108   MCHC 34.5 11/17/2023 1108   RDW 14.0 11/17/2023 1108   RDW 13.0 02/25/2023 0904   LYMPHSABS 2.6 11/17/2023 1108   LYMPHSABS 3.3 (H) 02/25/2023 0904   MONOABS 0.6 11/17/2023 1108   EOSABS 0.0 11/17/2023  1108   EOSABS 0.0 02/25/2023  0904   BASOSABS 0.1 11/17/2023 1108   BASOSABS 0.0 02/25/2023 0904     Diabetic Labs (most recent): Lab Results  Component Value Date   HGBA1C 5.5 04/15/2022    Lipid Panel     Component Value Date/Time   CHOL 265 (H) 11/17/2023 1108   CHOL 233 (H) 07/25/2020 1029   TRIG 93 11/17/2023 1108   HDL 48 11/17/2023 1108   HDL 47 07/25/2020 1029   CHOLHDL 5.6 11/17/2023 1108   VLDL 19 11/17/2023 1108   LDLCALC 199 (H) 11/17/2023 1108   LDLCALC 175 (H) 07/25/2020 1029   LABVLDL 11 07/25/2020 1029     Lab Results  Component Value Date   TSH 11.800 (H) 11/17/2023   TSH 24.400 (H) 03/11/2023   TSH 3.670 08/17/2022   TSH 8.740 (H) 06/26/2022   TSH 4.263 04/15/2022   TSH 0.020 (L) 01/02/2022   TSH 0.955 10/21/2021   TSH 2.460 08/25/2021   TSH 6.570 (H) 05/28/2021   TSH 12.100 (H) 03/26/2021   FREET4 0.60 (L) 11/17/2023   FREET4 0.94 03/11/2023   FREET4 1.32 08/17/2022   FREET4 0.78 (L) 06/26/2022   FREET4 0.95 04/15/2022   FREET4 2.10 (H) 01/02/2022   FREET4 1.31 10/21/2021   FREET4 1.43 08/25/2021   FREET4 1.24 05/28/2021   FREET4 0.82 03/26/2021    Results for LOUNELL, SCHUMACHER (MRN [id number]) as of 11/29/2020 09:49  Ref. Range 11/27/2020 13:14 11/27/2020 13:15  TSH Latest Ref Range: 0.450 - 4.500 uIU/mL 1.370 1.290  Triiodothyronine,Free,Serum Latest Ref Range: 2.0 - 4.4 pg/mL 2.8 2.9  T4,Free(Direct) Latest Ref Range: 0.82 - 1.77 ng/dL 9.05 9.01  Thyroperoxidase Ab SerPl-aCnc Latest Ref Range: 0 - 34 IU/mL  9  Thyroglobulin Antibody Latest Ref Range: 0.0 - 0.9 IU/mL  <1.0    Uptake and Scan from 10/29/20 CLINICAL DATA:  Hyperthyroidism   EXAM: THYROID  SCAN AND UPTAKE - 4 AND 24 HOURS   TECHNIQUE: Following oral administration of I-123 capsule, anterior planar imaging was acquired at 24 hours. Thyroid  uptake was calculated with a thyroid  probe at 4-6 hours and 24 hours.   RADIOPHARMACEUTICALS:  431 uCi I-123 sodium iodide p.o.   COMPARISON:  Thyroid   ultrasound 10/07/2020   FINDINGS: Homogeneous tracer distribution in both thyroid  lobes.   No focal areas of increased or decreased tracer localization.   4 hour I-123 uptake = 21.7% (normal 5-20%)   24 hour I-123 uptake = 44.3% (normal 10-30%)   IMPRESSION: Normal thyroid  scan with elevated 4 hour and 24 hour radio iodine uptakes.   Findings discussed with hyperthyroidism and Graves disease.     Electronically Signed   By: Oneil Kiss M.D.   On: 10/29/2020 17:17   Latest Reference Range & Units 06/26/22 10:22 08/17/22 11:45 03/11/23 09:21 11/17/23 11:07  TSH 0.350 - 4.500 uIU/mL 8.740 (H) 3.670 24.400 (H) 11.800 (H)  T4,Free(Direct) 0.61 - 1.12 ng/dL 9.21 (L) 8.67 9.05 9.39 (L)  (H): Data is abnormally high (L): Data is abnormally low  Assessment & Plan:   1. Hypothyroidism s/p RAI ablation for Graves Disease  she is being seen at a kind request of Vincente Saber, NP.  Her uptake and scan did show 4-hr uptake of 21.7% and 24-hr uptake of 44.3%, consistent with Graves disease and she had RAI ablation on 12/13/20.    Her previsit thyroid  function tests are consistent with under-replacement, however she reports she just started taking it consistently about 2 weeks  ago.  She is advised to continue restart Levothyroxine  62.5 mcg po daily before breakfast (half of the 137 mcg tabs) and repeat labs in 8 weeks.  Will call patient with results and next steps.   - The correct intake of thyroid  hormone (Levothyroxine , Synthroid ), is on empty stomach first thing in the morning, with water, separated by at least 30 minutes from breakfast and other medications,  and separated by more than 4 hours from calcium , iron, multivitamins, acid reflux medications (PPIs).  - This medication is a life-long medication and will be needed to correct thyroid  hormone imbalances for the rest of your life.  The dose may change from time to time, based on thyroid  blood work.  - It is extremely important  to be consistent taking this medication, near the same time each morning.  -AVOID TAKING PRODUCTS CONTAINING BIOTIN (commonly found in Hair, Skin, Nails vitamins) AS IT INTERFERES WITH THE VALIDITY OF THYROID  FUNCTION BLOOD TESTS.    -Patient is advised to maintain close follow up with Kaur, Charanpreet, NP for primary care needs.    I spent  20  minutes in the care of the patient today including review of labs from Thyroid  Function, CMP, and other relevant labs ; imaging/biopsy records (current and previous including abstractions from other facilities); face-to-face time discussing  her lab results and symptoms, medications doses, her options of short and long term treatment based on the latest standards of care / guidelines;   and documenting the encounter.  Kimberly Rocha  participated in the discussions, expressed understanding, and voiced agreement with the above plans.  All questions were answered to her satisfaction. she is encouraged to contact clinic should she have any questions or concerns prior to her return visit.  Follow up plan: Return labs in 8 weeks, will call with results and next steps..   Thank you for involving me in the care of this pleasant patient, and I will continue to update you with her progress.   Benton Rio, [facility] [address] [place], [place] [address] Phone: [phone] Fax: [phone]  11/29/2023, 4:03 PM

## 2023-11-30 ENCOUNTER — Emergency Department (HOSPITAL_COMMUNITY)
Admission: EM | Admit: 2023-11-30 | Discharge: 2023-11-30 | Discharge status: Against Medical Advice | Attending: Emergency Medicine | Admitting: Emergency Medicine

## 2023-11-30 ENCOUNTER — Other Ambulatory Visit: Payer: Self-pay

## 2023-11-30 ENCOUNTER — Encounter (HOSPITAL_COMMUNITY): Payer: Self-pay | Admitting: *Deleted

## 2023-11-30 DIAGNOSIS — R112 Nausea with vomiting, unspecified: Secondary | ICD-10-CM | POA: Insufficient documentation

## 2023-11-30 DIAGNOSIS — R10A2 Flank pain, left side: Secondary | ICD-10-CM | POA: Insufficient documentation

## 2023-11-30 DIAGNOSIS — Z5321 Procedure and treatment not carried out due to patient leaving prior to being seen by health care provider: Secondary | ICD-10-CM | POA: Diagnosis not present

## 2023-11-30 DIAGNOSIS — R519 Headache, unspecified: Secondary | ICD-10-CM | POA: Diagnosis present

## 2023-11-30 HISTORY — DX: Hidradenitis suppurativa: L73.2

## 2023-11-30 HISTORY — DX: Thyrotoxicosis with diffuse goiter without thyrotoxic crisis or storm: E05.00

## 2023-11-30 NOTE — ED Notes (Signed)
 Pt stated they were going to urgent care.

## 2023-11-30 NOTE — ED Triage Notes (Addendum)
 Pt BIB [facility] for HA since this morning, laid back down around 1130 and felt worse since the nap. Pt with left flank pain on the way here. Reported that pt had N/V x 4 earlier.

## 2023-12-24 ENCOUNTER — Encounter: Payer: Self-pay | Admitting: Nurse Practitioner

## 2024-01-04 ENCOUNTER — Encounter: Payer: Self-pay | Admitting: Nurse Practitioner

## 2024-01-04 ENCOUNTER — Inpatient Hospital Stay: Admission: RE | Admit: 2024-01-04 | Source: Ambulatory Visit

## 2024-01-04 NOTE — Telephone Encounter (Signed)
 Noted

## 2024-01-05 ENCOUNTER — Encounter: Payer: Self-pay | Admitting: Pulmonary Disease

## 2024-01-06 NOTE — Telephone Encounter (Signed)
 We will get her rescheduled if and when the insurance approves her to do the echo. Patient is aware

## 2024-01-06 NOTE — Telephone Encounter (Signed)
 Noted. Nothing further needed.

## 2024-01-07 ENCOUNTER — Other Ambulatory Visit: Payer: Self-pay

## 2024-01-10 NOTE — Telephone Encounter (Signed)
 We have the Auth now Auth # [id number]. I have sent a meessage to Arland Rodney to rescheduled the echo for this patient

## 2024-01-11 ENCOUNTER — Ambulatory Visit

## 2024-01-11 ENCOUNTER — Ambulatory Visit: Admitting: Pulmonary Disease

## 2024-01-11 ENCOUNTER — Encounter: Payer: Self-pay | Admitting: Pulmonary Disease

## 2024-01-11 VITALS — BP 110/60 | HR 96 | Temp 98.7°F | Ht 63.0 in | Wt 135.4 lb

## 2024-01-11 DIAGNOSIS — J432 Centrilobular emphysema: Secondary | ICD-10-CM

## 2024-01-11 DIAGNOSIS — R0602 Shortness of breath: Secondary | ICD-10-CM

## 2024-01-11 LAB — PULMONARY FUNCTION TEST
DL/VA % pred: 66 %
DL/VA: 2.86 ml/min/mmHg/L
DLCO unc % pred: 61 %
DLCO unc: 12.56 ml/min/mmHg
FEF 25-75 Post: 1.09 L/s
FEF 25-75 Pre: 1.13 L/s
FEF2575-%Change-Post: -3 %
FEF2575-%Pred-Post: 39 %
FEF2575-%Pred-Pre: 41 %
FEV1-%Change-Post: -1 %
FEV1-%Pred-Post: 73 %
FEV1-%Pred-Pre: 74 %
FEV1-Post: 2 L
FEV1-Pre: 2.02 L
FEV1FVC-%Change-Post: 3 %
FEV1FVC-%Pred-Pre: 86 %
FEV6-%Change-Post: -4 %
FEV6-%Pred-Post: 82 %
FEV6-%Pred-Pre: 86 %
FEV6-Post: 2.77 L
FEV6-Pre: 2.89 L
FEV6FVC-%Change-Post: 0 %
FEV6FVC-%Pred-Post: 101 %
FEV6FVC-%Pred-Pre: 101 %
FVC-%Change-Post: -4 %
FVC-%Pred-Post: 81 %
FVC-%Pred-Pre: 85 %
FVC-Post: 2.8 L
FVC-Pre: 2.93 L
Post FEV1/FVC ratio: 72 %
Post FEV6/FVC ratio: 99 %
Pre FEV1/FVC ratio: 69 %
Pre FEV6/FVC Ratio: 99 %
RV % pred: 84 %
RV: 1.45 L
TLC % pred: 88 %
TLC: 4.33 L

## 2024-01-11 MED ORDER — UMECLIDINIUM-VILANTEROL 62.5-25 MCG/ACT IN AEPB: 1.0000 | INHALATION_SPRAY | Freq: Every day | RESPIRATORY_TRACT | 3 refills | Status: AC

## 2024-01-11 NOTE — Progress Notes (Signed)
 Full PFT completed today ? ?

## 2024-01-11 NOTE — Progress Notes (Unsigned)
 Synopsis: Referred in by Malka Domino, MD   Subjective:   PATIENT ID: Kimberly Rocha Getting GENDER: female DOB: 01-27-1975, MRN: [id number]  Chief Complaint  Patient presents with   Shortness of Breath    SOB. No wheezing. Cough,dry.  Albuterol - PRN    HPI Ms. 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  She was last seen by Dr. Alaine on 01/2022 and was being on managed for COPD at the time.  She was on Anoro for a short period of time and then switched to albuterol  on an as-needed basis which she has not required.  She reports worsening shortness of breath on exertion today.  This is associated with fatigue and exhaustion.  Last chest x-ray in 2023 some pulmonary hyperinflation.  Last TSH in February 2025 was 24.400.    OV 01/11/2024 -Ms. Kimberly Rocha is here to follow-up on her PFT that shows stage II COPD gp A. I advised her to Start Anoro Ellipta  which she is agreeable with.  Family History  Problem Relation Age of Onset   COPD Mother    Hypertension Mother    COPD Father    Asthma Father    Diabetes Father    Heart disease Father    HIV/AIDS Brother    Stroke Maternal Grandmother    Hypertension Maternal Grandmother    Other Daughter        has issues with cervix     Social History   Socioeconomic History   Marital status: Single    Spouse name: Not on file   Number of children: Not on file   Years of education: Not on file   Highest education level: Never attended school  Occupational History   Not on file  Tobacco Use   Smoking status: Former    Current packs/day: 0.00    Average packs/day: 0.5 packs/day for 10.0 years (5.0 ttl pk-yrs)    Types: Cigarettes    Quit date: 11/26/2019    Years  since quitting: 4.1   Smokeless tobacco: Never   Tobacco comments:    no longer smoke cigarettes  Vaping Use   Vaping status: Former  Substance and Sexual Activity   Alcohol use: Yes    Alcohol/week: 2.0 standard drinks of alcohol    Types: 2 Glasses of wine per week    Comment: occ   Drug use: Yes    Frequency: 4.0 times per week    Types: Marijuana    Comment: twice a day   Sexual activity: Yes    Birth control/protection: Surgical    Comment: Tubal ligation  Other Topics Concern   Not on file  Social History Narrative   Single not working.   Lives with kids.   Social Drivers of Health   Tobacco Use: Medium Risk (01/11/2024)   Patient History    Smoking Tobacco Use: Former    Smokeless Tobacco Use: Never    Passive Exposure: Not on file  Financial Resource Strain: Medium Risk (11/04/2023)   Overall Financial Resource Strain (CARDIA)    Difficulty of Paying Living Expenses: Somewhat hard  Food Insecurity: Food Insecurity Present (11/04/2023)   Epic    Worried About Programme Researcher, Broadcasting/film/video in the Last Year: Sometimes true    [employer] of Food in the  Last Year: Sometimes true  Transportation Needs: Unmet Transportation Needs (11/04/2023)   Epic    Lack of Transportation (Medical): Yes    Lack of Transportation (Non-Medical): Yes  Physical Activity: Insufficiently Active (11/04/2023)   Exercise Vital Sign    Days of Exercise per Week: 7 days    Minutes of Exercise per Session: 20 min  Stress: Stress Concern Present (11/04/2023)   [employer] of Occupational Health - Occupational Stress Questionnaire    Feeling of Stress: Very much  Social Connections: Moderately Isolated (11/04/2023)   Social Connection and Isolation Panel    Frequency of Communication with Friends and Family: Three times a week    Frequency of Social Gatherings with Friends and Family: Once a week    Attends Religious Services: More than 4 times per year    Active Member of Clubs or Organizations: No     Attends Engineer, Structural: Not on file    Marital Status: Never married  Intimate Partner Violence: Not on file  Depression (PHQ2-9): Low Risk (11/19/2023)   Depression (PHQ2-9)    PHQ-2 Score: 0  Recent Concern: Depression (PHQ2-9) - High Risk (11/05/2023)   Depression (PHQ2-9)    PHQ-2 Score: 23  Alcohol Screen: Low Risk (11/04/2023)   Alcohol Screen    Last Alcohol Screening Score (AUDIT): 2  Housing: Low Risk (11/04/2023)   Epic    Unable to Pay for Housing in the Last Year: No    Number of Times Moved in the Last Year: 0    Homeless in the Last Year: No  Utilities: Not on file  Health Literacy: Not on file        Objective:   Vitals:   01/11/24 1611  BP: 110/60  Pulse: 96  Temp: 98.7 F (37.1 C)  SpO2: 98%  Weight: 135 lb 6.4 oz (61.4 kg)  Height: 5' 3 (1.6 m)   98% on RA BMI Readings from Last 3 Encounters:  01/11/24 23.99 kg/m  01/11/24 23.99 kg/m  11/30/23 24.61 kg/m   Wt Readings from Last 3 Encounters:  01/11/24 135 lb 6.4 oz (61.4 kg)  01/11/24 135 lb 6.4 oz (61.4 kg)  11/30/23 126 lb (57.2 kg)    Physical Exam GEN: NAD, Healthy Appearing HEENT: Supple Neck, Reactive Pupils, EOMI  CVS: Normal S1, Normal S2, RRR, No murmurs or ES appreciated  Lungs: Poor air movement, no wheezing or crackles appreciated.   Abdomen: Soft, non tender, non distended, + BS  Extremities: Warm and well perfused, No edema    Ancillary Information   CBC    Component Value Date/Time   WBC 10.2 11/17/2023 1108   RBC 3.96 11/17/2023 1108   HGB 13.4 11/17/2023 1108   HGB 13.0 02/25/2023 0904   HCT 38.8 11/17/2023 1108   HCT 38.5 02/25/2023 0904   PLT 239 11/17/2023 1108   PLT 213 02/25/2023 0904   MCV 98.0 11/17/2023 1108   MCV 98 (H) 02/25/2023 0904   MCH 33.8 11/17/2023 1108   MCHC 34.5 11/17/2023 1108   RDW 14.0 11/17/2023 1108   RDW 13.0 02/25/2023 0904   LYMPHSABS 2.6 11/17/2023 1108   LYMPHSABS 3.3 (H) 02/25/2023 0904   MONOABS 0.6  11/17/2023 1108   EOSABS 0.0 11/17/2023 1108   EOSABS 0.0 02/25/2023 0904   BASOSABS 0.1 11/17/2023 1108   BASOSABS 0.0 02/25/2023 0904   Labs and imaging were reviewed.     Latest Ref Rng & Units 01/11/2024    2:50 PM  PFT Results  FVC-Pre L 2.93  P  FVC-Predicted Pre % 85  P  FVC-Post L 2.80  P  FVC-Predicted Post % 81  P  Pre FEV1/FVC % % 69  P  Post FEV1/FCV % % 72  P  FEV1-Pre L 2.02  P  FEV1-Predicted Pre % 74  P  FEV1-Post L 2.00  P  DLCO uncorrected ml/min/mmHg 12.56  P  DLCO UNC% % 61  P  DLVA Predicted % 66  P  TLC L 4.33  P  TLC % Predicted % 88  P  RV % Predicted % 84  P    P Preliminary result     Assessment & Plan:  Ms. 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  #COPD Stage II gp A  []  A1AT [Not done] []  Start Anoro Elipta 1 puff daily.  []  Albuterol  As needed for now.  []  Echocardiogram to assess her cardiac function given ongoing dyspnea.   #Suspecting sleep apnea  She does have loud snoring and daytime fatigue.   []  HST.   RTC 4-6 months  I personally spent a total of 30 minutes in the care of the patient today including preparing to see the patient, getting/reviewing separately obtained history, performing a medically appropriate exam/evaluation, counseling and educating, documenting clinical information in the EHR, independently interpreting results, and communicating results.   Darrin Barn, MD [facility] 01/12/2024 5:19 PM

## 2024-01-11 NOTE — Patient Instructions (Signed)
 Full PFT completed today ? ?

## 2024-01-12 ENCOUNTER — Ambulatory Visit

## 2024-01-12 DIAGNOSIS — R0602 Shortness of breath: Secondary | ICD-10-CM

## 2024-01-14 ENCOUNTER — Ambulatory Visit: Admitting: Nurse Practitioner

## 2024-01-14 NOTE — Telephone Encounter (Signed)
 Patient was rescheduled to have the echo on 01/19/24 and she picked up the HST machine on 01/12/24

## 2024-01-19 ENCOUNTER — Inpatient Hospital Stay: Admission: RE | Admit: 2024-01-19 | Source: Ambulatory Visit

## 2024-01-28 NOTE — Telephone Encounter (Signed)
 The patient's echo has been rescheduled on 02/14/2024 @ 1:00am at [facility] Entrance. I have sent My chart message with the new appt to the patient

## 2024-02-01 ENCOUNTER — Encounter: Payer: Self-pay | Admitting: Dermatology

## 2024-02-14 ENCOUNTER — Ambulatory Visit: Admission: RE | Admit: 2024-02-14 | Source: Ambulatory Visit

## 2024-02-19 ENCOUNTER — Other Ambulatory Visit: Payer: Self-pay | Admitting: Nurse Practitioner

## 2024-02-19 ENCOUNTER — Other Ambulatory Visit: Payer: Self-pay | Admitting: Internal Medicine

## 2024-02-19 DIAGNOSIS — F418 Other specified anxiety disorders: Secondary | ICD-10-CM

## 2024-02-19 DIAGNOSIS — R232 Flushing: Secondary | ICD-10-CM

## 2024-02-24 ENCOUNTER — Ambulatory Visit: Admission: RE | Admit: 2024-02-24 | Source: Ambulatory Visit

## 2024-02-24 ENCOUNTER — Telehealth: Payer: Self-pay | Admitting: Pulmonary Disease
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
## 2024-02-25 ENCOUNTER — Other Ambulatory Visit: Payer: Self-pay | Admitting: Nurse Practitioner

## 2024-02-25 DIAGNOSIS — F418 Other specified anxiety disorders: Secondary | ICD-10-CM

## 2024-02-25 NOTE — Telephone Encounter (Signed)
 Medication refill.

## 2024-03-03 NOTE — Telephone Encounter (Signed)
 The Auth for the previous echo has expired so now working on getting it approved for the 2nd time. I have tried to contact the patient by phone can't leave a message. I have sent My chart message letting her know what I am doing. I also ask for what time of day she would like for the appt

## 2024-03-23 ENCOUNTER — Ambulatory Visit: Admitting: Nurse Practitioner

## 2024-03-28 ENCOUNTER — Ambulatory Visit: Admitting: Dermatology

## 2024-05-18 ENCOUNTER — Ambulatory Visit: Admitting: Pulmonary Disease
